# Patient Record
Sex: Female | Born: 1960 | Race: White | Hispanic: No | Marital: Single | Smoking: Smoker, current status unknown
Health system: Southern US, Community
[De-identification: ages and names within clinical notes are randomized; demographics above are authoritative.]

## PROBLEM LIST (undated history)

## (undated) DIAGNOSIS — E7800 Pure hypercholesterolemia, unspecified: Secondary | ICD-10-CM

## (undated) DIAGNOSIS — E119 Type 2 diabetes mellitus without complications: Secondary | ICD-10-CM

## (undated) DIAGNOSIS — K219 Gastro-esophageal reflux disease without esophagitis: Secondary | ICD-10-CM

## (undated) DIAGNOSIS — E559 Vitamin D deficiency, unspecified: Secondary | ICD-10-CM

## (undated) DIAGNOSIS — N289 Disorder of kidney and ureter, unspecified: Secondary | ICD-10-CM

## (undated) DIAGNOSIS — I10 Essential (primary) hypertension: Secondary | ICD-10-CM

## (undated) DIAGNOSIS — I4891 Unspecified atrial fibrillation: Secondary | ICD-10-CM

## (undated) DIAGNOSIS — M79606 Pain in leg, unspecified: Secondary | ICD-10-CM

## (undated) DIAGNOSIS — F339 Major depressive disorder, recurrent, unspecified: Secondary | ICD-10-CM

## (undated) DIAGNOSIS — K635 Polyp of colon: Secondary | ICD-10-CM

## (undated) DIAGNOSIS — I5189 Other ill-defined heart diseases: Secondary | ICD-10-CM

## (undated) DIAGNOSIS — R002 Palpitations: Secondary | ICD-10-CM

## (undated) HISTORY — DX: Essential (primary) hypertension: I10

## (undated) HISTORY — PX: AMPUTATION TOE: SHX6595

## (undated) HISTORY — DX: Major depressive disorder, recurrent, unspecified: F33.9

## (undated) HISTORY — DX: Gastro-esophageal reflux disease without esophagitis: K21.9

## (undated) HISTORY — DX: Polyp of colon: K63.5

## (undated) HISTORY — DX: Palpitations: R00.2

## (undated) HISTORY — DX: Vitamin D deficiency, unspecified: E55.9

## (undated) HISTORY — DX: Pain in leg, unspecified: M79.606

## (undated) HISTORY — DX: Disorder of kidney and ureter, unspecified: N28.9

## (undated) HISTORY — DX: Other ill-defined heart diseases: I51.89

## (undated) HISTORY — DX: Pure hypercholesterolemia, unspecified: E78.00

## (undated) HISTORY — DX: Unspecified atrial fibrillation: I48.91

## (undated) HISTORY — DX: Type 2 diabetes mellitus without complications: E11.9

---

## 1998-12-27 HISTORY — PX: TOTAL VAGINAL HYSTERECTOMY: SHX2548

## 2021-04-04 ENCOUNTER — Ambulatory Visit: Payer: Medicare Other | Admitting: Cardiovascular Disease

## 2021-04-18 ENCOUNTER — Encounter: Payer: Self-pay | Admitting: Cardiovascular Disease

## 2021-04-18 ENCOUNTER — Other Ambulatory Visit: Payer: Self-pay

## 2021-04-18 ENCOUNTER — Ambulatory Visit (INDEPENDENT_AMBULATORY_CARE_PROVIDER_SITE_OTHER): Payer: Medicare Other | Admitting: Cardiovascular Disease

## 2021-04-18 VITALS — BP 134/78 | HR 84 | Ht 69.0 in | Wt 212.8 lb

## 2021-04-18 DIAGNOSIS — Z01818 Encounter for other preprocedural examination: Secondary | ICD-10-CM | POA: Diagnosis not present

## 2021-04-18 DIAGNOSIS — I2510 Atherosclerotic heart disease of native coronary artery without angina pectoris: Secondary | ICD-10-CM

## 2021-04-18 DIAGNOSIS — Z01812 Encounter for preprocedural laboratory examination: Secondary | ICD-10-CM

## 2021-04-18 DIAGNOSIS — I480 Paroxysmal atrial fibrillation: Secondary | ICD-10-CM

## 2021-04-18 DIAGNOSIS — I739 Peripheral vascular disease, unspecified: Secondary | ICD-10-CM | POA: Diagnosis not present

## 2021-04-18 DIAGNOSIS — E785 Hyperlipidemia, unspecified: Secondary | ICD-10-CM | POA: Diagnosis not present

## 2021-04-18 MED ORDER — PREDNISONE 50 MG PO TABS: ORAL_TABLET | ORAL | 0 refills | Status: DC

## 2021-04-18 NOTE — Patient Instructions (Addendum)
    [facility] CARDIOVASCULAR DIVISION [facility] NORTHLINE [address] [place] [address] Dept: [phone] Loc: [phone]  Kerry Randall  04/18/2021  You are scheduled for a Peripheral Angiogram on Wednesday, June 1 with Dr. [name].  1. Please arrive at the [facility] (Main Entrance A) at [facility]: [address] [place], [place] [address] at 6:00 AM (This time is two hours before your procedure to ensure your preparation). Free valet parking service is available.   Special note: Every effort is made to have your procedure done on time. Please understand that emergencies sometimes delay scheduled procedures.  2. Diet: Do not eat solid foods after midnight.  The patient may have clear liquids until 5am upon the day of the procedure.  3. Labs: Today in office (BMET, CBC)  4. Medication instructions in preparation for your procedure:   Contrast Allergy: Yes,  Please take Prednisone 50mg  by mouth at: Thirteen hours prior to cath Seven hours prior to cath (TAKE LAST DOSE OF PREDNISONE 50 mg AND BENEDRYL 50 mg WITH YOU TO THE HOSPITAL)  Hold Eliquis for 2 days prior to procedure (No Eliquis Monday or Tuesday)   Take 1/2 of your normal dose of insulin the night before NO insulin morning of procedure  HOLD Farxiga AM of procedure HOLD Lisnopril day before AND morning of procedure  Do not take Diabetes Med Glucophage (Metformin) on the day of the procedure and HOLD 48 HOURS AFTER THE PROCEDURE.  On the morning of your procedure, take your Aspirin and any morning medicines NOT listed above.  You may use sips of water.  5. Plan for one night stay--bring personal belongings. 6. Bring a current list of your medications and current insurance cards. 7. You MUST have a responsible person to drive you home. 8. Someone MUST be with you the first 24 hours after you arrive home or your discharge will be delayed. 9.  Please wear clothes that are easy to get on and off and wear slip-on shoes.  Thank you for allowing Saturday to care for you!   -- [facility]    Your physician has requested that you have a lower extremity arterial duplex (1 week after procedure). This test is an ultrasound of the arteries in the legs. It looks at arterial blood flow in the legs. Allow one hour for Lower Arterial scans. There are no restrictions or special instructions   Follow up with Dr. [place] 2 weeks after procedure

## 2021-04-18 NOTE — H&P (View-Only) (Signed)
Cardiology Office Note   Date:  04/18/2021   ID:  Kerry Randall, DOB June 24, 1961, MRN [id number]  PCP:  Kerry Rud, PA-C  Cardiologist:   Kerry Bears, MD   No chief complaint on file.     History of Present Illness: Kerry Randall is a 60 y.o. female who was referred by Kerry Randall for evaluation and management of peripheral arterial disease. She has known history of coronary artery disease, essential hypertension, stage III chronic kidney disease, type 2 diabetes, stroke, tobacco use and peripheral arterial disease.  There is reported history of paroxysmal atrial fibrillation on previous monitor.  She was started on Eliquis in April for this. She is status post RCA drug-eluting stent placement in 2016.  She reports prolonged history of diabetic foot ulceration.  She is status post right metatarsal amputation about 5 years ago with recurrent nonhealing ulceration on the dorsal side of the foot.  In addition, she is status post amputation of the left small toe.  She underwent abdominal aortogram with right lower extremity angiography using CO2 by Dr. Isabell Randall in July 2021.  There was no significant aortoiliac disease.  There was multilevel nonflow limiting disease affecting the right SFA and two-vessel runoff below the knee via the anterior tibial and posterior tibial arteries.  No intervention was needed.    She is followed by Dr. Romualdo Randall in podiatry at [facility].  She has chronic nonhealing ulceration on the dorsal side of the right foot.  This is approximately 2 x 2 inches and about half centimeter deep.  She had venous Doppler study done in March which was negative for DVT.  She underwent lower extremity arterial Doppler studies in March which showed bilateral ABI of 0.65.  Duplex showed possible severe stenosis in bilateral SFA.  Velocity in the distal right SFA was 436 cm/s.   Past Medical History:  Diagnosis Date  . Atrial fibrillation (HCC)   . Colon polyps    . Diabetes mellitus without complication (HCC)   . GERD (gastroesophageal reflux disease)   . Heart disease, hyperkinetic   . Hypercholesteremia   . Hypertension   . Kidney disease   . Leg pain   . Major depressive disorder, recurrent (HCC)   . Palpitations   . Vitamin D deficiency     Past Surgical History:  Procedure Laterality Date  . AMPUTATION TOE Bilateral    small toe on left foot; all toes on right foot     Current Outpatient Medications  Medication Sig Dispense Refill  . albuterol (VENTOLIN HFA) 108 (90 Base) MCG/ACT inhaler Inhale into the lungs.    Marland Kitchen apixaban (ELIQUIS) 5 MG TABS tablet     . ascorbic acid (VITAMIN C) 500 MG tablet Take by mouth.    . Cholecalciferol 25 MCG (1000 UT) tablet Take by mouth.    . cyanocobalamin 1000 MCG tablet Take by mouth.    . dapagliflozin propanediol (FARXIGA) 10 MG TABS tablet Take by mouth.    . Dulaglutide (TRULICITY) 0.75 MG/0.5ML SOPN 1 (ONE) SOLUTION PEN INJECTOR UNDER SKIN WEEKLY    . DULoxetine (CYMBALTA) 60 MG capsule Take 1 capsule by mouth 2 (two) times daily.    . fesoterodine (TOVIAZ) 8 MG TB24 tablet Take 1 tablet by mouth daily.    Marland Kitchen gabapentin (NEURONTIN) 600 MG tablet Take 1 tablet by mouth 3 (three) times daily.    Marland Kitchen gemfibrozil (LOPID) 600 MG tablet Take 1 tablet by mouth 2 (two) times daily.    Marland Kitchen  hydrOXYzine (ATARAX/VISTARIL) 25 MG tablet Take 1 tablet by mouth 2 (two) times daily.    . insulin glargine (LANTUS) 100 UNIT/ML Solostar Pen Inject into the skin. 17 units at night    . Iron-FA-B Cmp-C-Biot-Probiotic (FUSION PLUS PO) Take by mouth.    . Iron-Vit C-Vit B12-Folic Acid (IRON 100 PLUS) 270-623-7.628-3 MG TABS Take by mouth 2 (two) times daily.    Marland Kitchen lisinopril (ZESTRIL) 30 MG tablet Take 1 tablet by mouth daily.    . metFORMIN (GLUCOPHAGE-XR) 500 MG 24 hr tablet Take 2 tablets by mouth 2 (two) times daily.    . metoprolol succinate (TOPROL-XL) 50 MG 24 hr tablet Take 1 tablet by mouth daily.    .  mirabegron ER (MYRBETRIQ) 25 MG TB24 tablet Take 1 tablet by mouth daily.    . nitroGLYCERIN (NITROSTAT) 0.4 MG SL tablet DISSOLVE 1 TABLET UNDER THE TONGUE EVERY 5 MINUTES AS  NEEDED FOR CHEST PAIN. MAX  OF 3 TABLETS IN 15 MINUTES. CALL 911 IF PAIN PERSISTS.    Marland Kitchen pantoprazole (PROTONIX) 40 MG tablet Take 1 tablet by mouth daily.    . predniSONE (DELTASONE) 50 MG tablet Take 1 tablet 13 hours prior to procedure, 1 tablet 7 hours prior to procedure, and 1 tablet (with Benedryl 50 mg) prior to going to the hospital for procedure 3 tablet 0  . REPATHA SURECLICK 140 MG/ML SOAJ Inject into the skin.    Marland Kitchen varenicline (CHANTIX) 1 MG tablet Take 1 tablet by mouth 2 (two) times daily.     No current facility-administered medications for this visit.    Allergies:   Erythromycin, Iodinated diagnostic agents, Levofloxacin, Oxycodone-acetaminophen, Penicillins, Rosuvastatin, and Sulfa antibiotics    Social History:  The patient  reports that she has been smoking. She has never used smokeless tobacco.   Family History:  The patient's family history includes Anxiety disorder in her daughter; Brain cancer in her sister; Depression in her daughter and sister; Diabetes type II in her daughter and mother; Heart disease in her mother and paternal aunt; Hyperlipidemia in her maternal aunt, maternal uncle, paternal aunt, and paternal uncle; Hypertension in her maternal aunt, maternal uncle, mother, paternal aunt, paternal uncle, and sister; Throat cancer in her father.    ROS:  Please see the history of present illness.   Otherwise, review of systems are positive for none.   All other systems are reviewed and negative.    PHYSICAL EXAM: VS:  BP 134/78 (BP Location: Right Arm, Patient Position: Sitting, Cuff Size: Normal)   Pulse 84   Ht 5\' 9"  (1.753 m)   Wt 212 lb 12.8 oz (96.5 kg)   BMI 31.43 kg/m  , BMI Body mass index is 31.43 kg/m. GEN: Well nourished, well developed, in no acute distress  HEENT: normal   Neck: no JVD, carotid bruits, or masses Cardiac: RRR; no murmurs, rubs, or gallops,no edema  Respiratory:  clear to auscultation bilaterally, normal work of breathing GI: soft, nontender, nondistended, + BS MS: no deformity or atrophy  Skin: warm and dry, no rash Neuro:  Strength and sensation are intact Psych: euthymic mood, full affect   EKG:  EKG is ordered today. The ekg ordered today demonstrates normal sinus rhythm with PVCs.   Recent Labs: No results found for requested labs within last 8760 hours.    Lipid Panel No results found for: CHOL, TRIG, HDL, CHOLHDL, VLDL, LDLCALC, LDLDIRECT    Wt Readings from Last 3 Encounters:  04/18/21 212 lb 12.8 oz (  96.5 kg)       No flowsheet data found.    ASSESSMENT AND PLAN:  1.  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  Noninvasive vascular studies suggested significant bilateral SFA disease.  This is a limb threatening situation.  I recommend proceeding with  abdominal aortogram with lower extremity angiography and possible endovascular intervention.  I discussed the procedure in details as well as risk and benefits.  She does have underlying chronic kidney disease and thus we will plan on using CO2 and hydrate before the procedure.  Hold Eliquis 2 days before the procedure.  Planned access is via the left common femoral artery.  2.  Paroxysmal atrial fibrillation: This was diagnosed based on outpatient monitor.  She is currently on Eliquis which will be held 2 days before the angiogram.  3. Hyperlipidemia: Recent lipid profile showed an LDL of 38..  There is reported history of intolerance to statins due to myalgia and currently she is on Lopid and Repatha.  4.  Essential hypertension: Blood pressure is well controlled.  5.  Coronary artery disease involving native coronary arteries without angina: Previous stent  placement to the right coronary artery.  Continue medical therapy.  6.  Tobacco use: Prolonged history of tobacco use but she reports that she quit 1 week ago.   Disposition: Schedule angiogram for next week and FU with me in 1 month  Signed,  Kerry Bears, MD  04/18/2021 1:34 PM    [facility]

## 2021-04-18 NOTE — Progress Notes (Signed)
Cardiology Office Note   Date:  04/18/2021   ID:  Kerry Randall, DOB June 24, 1961, MRN [id number]  PCP:  Coralee Rud, PA-C  Cardiologist:   [name], MD   No chief complaint on file.     History of Present Illness: Kerry Randall is a 60 y.o. female who was referred by Roxanne Mins for evaluation and management of peripheral arterial disease. She has known history of coronary artery disease, essential hypertension, stage III chronic kidney disease, type 2 diabetes, stroke, tobacco use and peripheral arterial disease.  There is reported history of paroxysmal atrial fibrillation on previous monitor.  She was started on Eliquis in April for this. She is status post RCA drug-eluting stent placement in 2016.  She reports prolonged history of diabetic foot ulceration.  She is status post right metatarsal amputation about 5 years ago with recurrent nonhealing ulceration on the dorsal side of the foot.  In addition, she is status post amputation of the left small toe.  She underwent abdominal aortogram with right lower extremity angiography using CO2 by Dr. Isabell Jarvis in July 2021.  There was no significant aortoiliac disease.  There was multilevel nonflow limiting disease affecting the right SFA and two-vessel runoff below the knee via the anterior tibial and posterior tibial arteries.  No intervention was needed.    She is followed by Dr. Romualdo Bolk in podiatry at [facility].  She has chronic nonhealing ulceration on the dorsal side of the right foot.  This is approximately 2 x 2 inches and about half centimeter deep.  She had venous Doppler study done in March which was negative for DVT.  She underwent lower extremity arterial Doppler studies in March which showed bilateral ABI of 0.65.  Duplex showed possible severe stenosis in bilateral SFA.  Velocity in the distal right SFA was 436 cm/s.   Past Medical History:  Diagnosis Date  . Atrial fibrillation (HCC)   . Colon polyps    . Diabetes mellitus without complication (HCC)   . GERD (gastroesophageal reflux disease)   . Heart disease, hyperkinetic   . Hypercholesteremia   . Hypertension   . Kidney disease   . Leg pain   . Major depressive disorder, recurrent (HCC)   . Palpitations   . Vitamin D deficiency     Past Surgical History:  Procedure Laterality Date  . AMPUTATION TOE Bilateral    small toe on left foot; all toes on right foot     Current Outpatient Medications  Medication Sig Dispense Refill  . albuterol (VENTOLIN HFA) 108 (90 Base) MCG/ACT inhaler Inhale into the lungs.    Marland Kitchen apixaban (ELIQUIS) 5 MG TABS tablet     . ascorbic acid (VITAMIN C) 500 MG tablet Take by mouth.    . Cholecalciferol 25 MCG (1000 UT) tablet Take by mouth.    . cyanocobalamin 1000 MCG tablet Take by mouth.    . dapagliflozin propanediol (FARXIGA) 10 MG TABS tablet Take by mouth.    . Dulaglutide (TRULICITY) 0.75 MG/0.5ML SOPN 1 (ONE) SOLUTION PEN INJECTOR UNDER SKIN WEEKLY    . DULoxetine (CYMBALTA) 60 MG capsule Take 1 capsule by mouth 2 (two) times daily.    . fesoterodine (TOVIAZ) 8 MG TB24 tablet Take 1 tablet by mouth daily.    Marland Kitchen gabapentin (NEURONTIN) 600 MG tablet Take 1 tablet by mouth 3 (three) times daily.    Marland Kitchen gemfibrozil (LOPID) 600 MG tablet Take 1 tablet by mouth 2 (two) times daily.    Marland Kitchen  hydrOXYzine (ATARAX/VISTARIL) 25 MG tablet Take 1 tablet by mouth 2 (two) times daily.    . insulin glargine (LANTUS) 100 UNIT/ML Solostar Pen Inject into the skin. 17 units at night    . Iron-FA-B Cmp-C-Biot-Probiotic (FUSION PLUS PO) Take by mouth.    . Iron-Vit C-Vit B12-Folic Acid (IRON 100 PLUS) 270-623-7.628-3 MG TABS Take by mouth 2 (two) times daily.    Marland Kitchen lisinopril (ZESTRIL) 30 MG tablet Take 1 tablet by mouth daily.    . metFORMIN (GLUCOPHAGE-XR) 500 MG 24 hr tablet Take 2 tablets by mouth 2 (two) times daily.    . metoprolol succinate (TOPROL-XL) 50 MG 24 hr tablet Take 1 tablet by mouth daily.    .  mirabegron ER (MYRBETRIQ) 25 MG TB24 tablet Take 1 tablet by mouth daily.    . nitroGLYCERIN (NITROSTAT) 0.4 MG SL tablet DISSOLVE 1 TABLET UNDER THE TONGUE EVERY 5 MINUTES AS  NEEDED FOR CHEST PAIN. MAX  OF 3 TABLETS IN 15 MINUTES. CALL 911 IF PAIN PERSISTS.    Marland Kitchen pantoprazole (PROTONIX) 40 MG tablet Take 1 tablet by mouth daily.    . predniSONE (DELTASONE) 50 MG tablet Take 1 tablet 13 hours prior to procedure, 1 tablet 7 hours prior to procedure, and 1 tablet (with Benedryl 50 mg) prior to going to the hospital for procedure 3 tablet 0  . REPATHA SURECLICK 140 MG/ML SOAJ Inject into the skin.    Marland Kitchen varenicline (CHANTIX) 1 MG tablet Take 1 tablet by mouth 2 (two) times daily.     No current facility-administered medications for this visit.    Allergies:   Erythromycin, Iodinated diagnostic agents, Levofloxacin, Oxycodone-acetaminophen, Penicillins, Rosuvastatin, and Sulfa antibiotics    Social History:  The patient  reports that she has been smoking. She has never used smokeless tobacco.   Family History:  The patient's family history includes Anxiety disorder in her daughter; Brain cancer in her sister; Depression in her daughter and sister; Diabetes type II in her daughter and mother; Heart disease in her mother and paternal aunt; Hyperlipidemia in her maternal aunt, maternal uncle, paternal aunt, and paternal uncle; Hypertension in her maternal aunt, maternal uncle, mother, paternal aunt, paternal uncle, and sister; Throat cancer in her father.    ROS:  Please see the history of present illness.   Otherwise, review of systems are positive for none.   All other systems are reviewed and negative.    PHYSICAL EXAM: VS:  BP 134/78 (BP Location: Right Arm, Patient Position: Sitting, Cuff Size: Normal)   Pulse 84   Ht 5\' 9"  (1.753 m)   Wt 212 lb 12.8 oz (96.5 kg)   BMI 31.43 kg/m  , BMI Body mass index is 31.43 kg/m. GEN: Well nourished, well developed, in no acute distress  HEENT: normal   Neck: no JVD, carotid bruits, or masses Cardiac: RRR; no murmurs, rubs, or gallops,no edema  Respiratory:  clear to auscultation bilaterally, normal work of breathing GI: soft, nontender, nondistended, + BS MS: no deformity or atrophy  Skin: warm and dry, no rash Neuro:  Strength and sensation are intact Psych: euthymic mood, full affect   EKG:  EKG is ordered today. The ekg ordered today demonstrates normal sinus rhythm with PVCs.   Recent Labs: No results found for requested labs within last 8760 hours.    Lipid Panel No results found for: CHOL, TRIG, HDL, CHOLHDL, VLDL, LDLCALC, LDLDIRECT    Wt Readings from Last 3 Encounters:  04/18/21 212 lb 12.8 oz (  96.5 kg)       No flowsheet data found.    ASSESSMENT AND PLAN:  1.  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  Noninvasive vascular studies suggested significant bilateral SFA disease.  This is a limb threatening situation.  I recommend proceeding with  abdominal aortogram with lower extremity angiography and possible endovascular intervention.  I discussed the procedure in details as well as risk and benefits.  She does have underlying chronic kidney disease and thus we will plan on using CO2 and hydrate before the procedure.  Hold Eliquis 2 days before the procedure.  Planned access is via the left common femoral artery.  2.  Paroxysmal atrial fibrillation: This was diagnosed based on outpatient monitor.  She is currently on Eliquis which will be held 2 days before the angiogram.  3. Hyperlipidemia: Recent lipid profile showed an LDL of 38..  There is reported history of intolerance to statins due to myalgia and currently she is on Lopid and Repatha.  4.  Essential hypertension: Blood pressure is well controlled.  5.  Coronary artery disease involving native coronary arteries without angina: Previous stent  placement to the right coronary artery.  Continue medical therapy.  6.  Tobacco use: Prolonged history of tobacco use but she reports that she quit 1 week ago.   Disposition: Schedule angiogram for next week and FU with me in 1 month  Signed,  [name], MD  04/18/2021 1:34 PM    [facility]

## 2021-04-19 LAB — CBC
Hematocrit: 38 % (ref 34.0–46.6)
Hemoglobin: 12.1 g/dL (ref 11.1–15.9)
MCH: 28.6 pg (ref 26.6–33.0)
MCHC: 31.8 g/dL (ref 31.5–35.7)
MCV: 90 fL (ref 79–97)
Platelets: 235 10*3/uL (ref 150–450)
RBC: 4.23 x10E6/uL (ref 3.77–5.28)
RDW: 14.3 % (ref 11.7–15.4)
WBC: 8.5 10*3/uL (ref 3.4–10.8)

## 2021-04-19 LAB — BASIC METABOLIC PANEL
BUN/Creatinine Ratio: 16 (ref 12–28)
BUN: 19 mg/dL (ref 8–27)
CO2: 16 mmol/L — ABNORMAL LOW (ref 20–29)
Calcium: 9.2 mg/dL (ref 8.7–10.3)
Chloride: 106 mmol/L (ref 96–106)
Creatinine, Ser: 1.21 mg/dL — ABNORMAL HIGH (ref 0.57–1.00)
Glucose: 100 mg/dL — ABNORMAL HIGH (ref 65–99)
Potassium: 5.6 mmol/L — ABNORMAL HIGH (ref 3.5–5.2)
Sodium: 140 mmol/L (ref 134–144)
eGFR: 51 mL/min/{1.73_m2} — ABNORMAL LOW (ref >59)

## 2021-04-20 ENCOUNTER — Other Ambulatory Visit (HOSPITAL_COMMUNITY): Payer: Self-pay | Admitting: Internal Medicine

## 2021-04-20 DIAGNOSIS — K3184 Gastroparesis: Secondary | ICD-10-CM

## 2021-04-25 NOTE — Pre-Procedure Instructions (Signed)
Attempted to call patient regarding procedure instructions.    Left voice mail to arrive @ 6:00am in the morning.  Arrival time and place: [facility] Main Entrance [facility]) at: 6:00   No solid food after midnight prior to cath, clear liquids until 5 AM day of procedure. CONTRAST ALLERGY: Yes  Take Prednisone & Benadryl as instructed.   Eliquis last dose should have been Sunday 04/23/21.  Hold Farxiga and Lisinopril day of procedure.  Also hold Metformin day of procedure.    AM meds can be  taken pre-cath with sips of water including: ASA 81 mg   Confirmed patient has responsible adult to drive home post procedure and be with patient first 24 hours after arriving home:  You are allowed ONE visitor in the waiting room during the time you are at the hospital for your procedure. Both you and your visitor must wear a mask once you enter the hospital.   Patient reports does not currently have any symptoms concerning for COVID-19 and no household members with COVID-19 like illness.   If anyone is having covid symptoms please call the office to reschedule.

## 2021-04-26 ENCOUNTER — Other Ambulatory Visit: Payer: Self-pay

## 2021-04-26 ENCOUNTER — Encounter (HOSPITAL_COMMUNITY): Payer: Self-pay | Admitting: Cardiovascular Disease

## 2021-04-26 ENCOUNTER — Ambulatory Visit (HOSPITAL_COMMUNITY)
Admission: RE | Admit: 2021-04-26 | Discharge: 2021-04-27 | Disposition: A | Discharge status: Home / Self Care | Payer: Medicare Other | Attending: Cardiovascular Disease | Admitting: Cardiovascular Disease

## 2021-04-26 ENCOUNTER — Encounter (HOSPITAL_COMMUNITY)
Admission: RE | Disposition: A | Discharge status: Home / Self Care | Payer: Self-pay | Source: Home / Self Care | Attending: Cardiovascular Disease

## 2021-04-26 DIAGNOSIS — Z880 Allergy status to penicillin: Secondary | ICD-10-CM | POA: Insufficient documentation

## 2021-04-26 DIAGNOSIS — I480 Paroxysmal atrial fibrillation: Secondary | ICD-10-CM | POA: Diagnosis not present

## 2021-04-26 DIAGNOSIS — L97519 Non-pressure chronic ulcer of other part of right foot with unspecified severity: Secondary | ICD-10-CM | POA: Insufficient documentation

## 2021-04-26 DIAGNOSIS — E785 Hyperlipidemia, unspecified: Secondary | ICD-10-CM | POA: Insufficient documentation

## 2021-04-26 DIAGNOSIS — Z7901 Long term (current) use of anticoagulants: Secondary | ICD-10-CM | POA: Diagnosis not present

## 2021-04-26 DIAGNOSIS — Z882 Allergy status to sulfonamides status: Secondary | ICD-10-CM | POA: Insufficient documentation

## 2021-04-26 DIAGNOSIS — F17210 Nicotine dependence, cigarettes, uncomplicated: Secondary | ICD-10-CM | POA: Insufficient documentation

## 2021-04-26 DIAGNOSIS — I70235 Atherosclerosis of native arteries of right leg with ulceration of other part of foot: Secondary | ICD-10-CM | POA: Diagnosis not present

## 2021-04-26 DIAGNOSIS — E11621 Type 2 diabetes mellitus with foot ulcer: Secondary | ICD-10-CM | POA: Diagnosis not present

## 2021-04-26 DIAGNOSIS — E1122 Type 2 diabetes mellitus with diabetic chronic kidney disease: Secondary | ICD-10-CM | POA: Insufficient documentation

## 2021-04-26 DIAGNOSIS — E1151 Type 2 diabetes mellitus with diabetic peripheral angiopathy without gangrene: Secondary | ICD-10-CM | POA: Insufficient documentation

## 2021-04-26 DIAGNOSIS — Z794 Long term (current) use of insulin: Secondary | ICD-10-CM

## 2021-04-26 DIAGNOSIS — Z881 Allergy status to other antibiotic agents status: Secondary | ICD-10-CM | POA: Diagnosis not present

## 2021-04-26 DIAGNOSIS — Z7984 Long term (current) use of oral hypoglycemic drugs: Secondary | ICD-10-CM | POA: Diagnosis not present

## 2021-04-26 DIAGNOSIS — I70211 Atherosclerosis of native arteries of extremities with intermittent claudication, right leg: Secondary | ICD-10-CM | POA: Diagnosis not present

## 2021-04-26 DIAGNOSIS — Z95828 Presence of other vascular implants and grafts: Secondary | ICD-10-CM

## 2021-04-26 DIAGNOSIS — I2510 Atherosclerotic heart disease of native coronary artery without angina pectoris: Secondary | ICD-10-CM | POA: Insufficient documentation

## 2021-04-26 DIAGNOSIS — Z79899 Other long term (current) drug therapy: Secondary | ICD-10-CM | POA: Diagnosis not present

## 2021-04-26 DIAGNOSIS — N183 Chronic kidney disease, stage 3 unspecified: Secondary | ICD-10-CM | POA: Diagnosis not present

## 2021-04-26 DIAGNOSIS — I70229 Atherosclerosis of native arteries of extremities with rest pain, unspecified extremity: Secondary | ICD-10-CM | POA: Diagnosis present

## 2021-04-26 DIAGNOSIS — Z955 Presence of coronary angioplasty implant and graft: Secondary | ICD-10-CM | POA: Diagnosis not present

## 2021-04-26 DIAGNOSIS — I739 Peripheral vascular disease, unspecified: Secondary | ICD-10-CM

## 2021-04-26 DIAGNOSIS — I129 Hypertensive chronic kidney disease with stage 1 through stage 4 chronic kidney disease, or unspecified chronic kidney disease: Secondary | ICD-10-CM | POA: Insufficient documentation

## 2021-04-26 DIAGNOSIS — E118 Type 2 diabetes mellitus with unspecified complications: Secondary | ICD-10-CM

## 2021-04-26 DIAGNOSIS — K3184 Gastroparesis: Secondary | ICD-10-CM

## 2021-04-26 HISTORY — PX: PERIPHERAL VASCULAR INTERVENTION: CATH118257

## 2021-04-26 HISTORY — PX: PERIPHERAL VASCULAR BALLOON ANGIOPLASTY: CATH118281

## 2021-04-26 HISTORY — PX: ABDOMINAL AORTOGRAM W/LOWER EXTREMITY: CATH118223

## 2021-04-26 LAB — BASIC METABOLIC PANEL
Anion gap: 9 (ref 5–15)
BUN: 28 mg/dL — ABNORMAL HIGH (ref 8–23)
CO2: 21 mmol/L — ABNORMAL LOW (ref 22–32)
Calcium: 9.2 mg/dL (ref 8.9–10.3)
Chloride: 107 mmol/L (ref 98–111)
Creatinine, Ser: 1.31 mg/dL — ABNORMAL HIGH (ref 0.44–1.00)
GFR, Estimated: 46 mL/min — ABNORMAL LOW (ref >60)
Glucose, Bld: 264 mg/dL — ABNORMAL HIGH (ref 70–99)
Potassium: 5.4 mmol/L — ABNORMAL HIGH (ref 3.5–5.1)
Sodium: 137 mmol/L (ref 135–145)

## 2021-04-26 LAB — POCT ACTIVATED CLOTTING TIME
Activated Clotting Time: 261 seconds
Activated Clotting Time: 225 seconds
Activated Clotting Time: 225 seconds

## 2021-04-26 LAB — GLUCOSE, CAPILLARY
Glucose-Capillary: 237 mg/dL — ABNORMAL HIGH (ref 70–99)
Glucose-Capillary: 197 mg/dL — ABNORMAL HIGH (ref 70–99)

## 2021-04-26 SURGERY — ABDOMINAL AORTOGRAM W/LOWER EXTREMITY
Anesthesia: LOCAL | Laterality: Right

## 2021-04-26 MED ORDER — LABETALOL HCL 5 MG/ML IV SOLN: 10.0000 mg | INTRAVENOUS | Status: DC | PRN

## 2021-04-26 MED ORDER — ASPIRIN 81 MG PO CHEW: 81.0000 mg | CHEWABLE_TABLET | ORAL | Status: DC

## 2021-04-26 MED ORDER — LIDOCAINE HCL (PF) 1 % IJ SOLN
INTRAMUSCULAR | Status: DC | PRN
  Administered 2021-04-26: 20 mL via INTRADERMAL

## 2021-04-26 MED ORDER — GABAPENTIN 600 MG PO TABS
1200.0000 mg | ORAL_TABLET | Freq: Two times a day (BID) | ORAL | Status: DC
  Administered 2021-04-26 – 2021-04-27 (×2): 1200 mg via ORAL
  Filled 2021-04-26 (×2): qty 2

## 2021-04-26 MED ORDER — MIDAZOLAM HCL 2 MG/2ML IJ SOLN
INTRAMUSCULAR | Status: AC
  Filled 2021-04-26: qty 2

## 2021-04-26 MED ORDER — SODIUM CHLORIDE 0.9% FLUSH: 3.0000 mL | INTRAVENOUS | Status: DC | PRN

## 2021-04-26 MED ORDER — PANTOPRAZOLE SODIUM 40 MG PO TBEC
40.0000 mg | DELAYED_RELEASE_TABLET | Freq: Every morning | ORAL | Status: DC
  Administered 2021-04-27: 40 mg via ORAL
  Filled 2021-04-26: qty 1

## 2021-04-26 MED ORDER — ASCORBIC ACID 500 MG PO TABS
500.0000 mg | ORAL_TABLET | Freq: Every morning | ORAL | Status: DC
  Administered 2021-04-27: 500 mg via ORAL
  Filled 2021-04-26: qty 1

## 2021-04-26 MED ORDER — B COMPLEX-C PO TABS: 1.0000 | ORAL_TABLET | ORAL | Status: DC

## 2021-04-26 MED ORDER — BIOTIN 5000 MCG PO TABS: 5000.0000 ug | ORAL_TABLET | Freq: Every morning | ORAL | Status: DC

## 2021-04-26 MED ORDER — METOPROLOL SUCCINATE ER 25 MG PO TB24
50.0000 mg | ORAL_TABLET | Freq: Every morning | ORAL | Status: DC
  Administered 2021-04-27: 50 mg via ORAL
  Filled 2021-04-26: qty 2

## 2021-04-26 MED ORDER — EVOLOCUMAB 140 MG/ML ~~LOC~~ SOAJ: 140.0000 mg | SUBCUTANEOUS | Status: DC

## 2021-04-26 MED ORDER — SODIUM CHLORIDE 0.9 % IV SOLN: INTRAVENOUS | Status: AC

## 2021-04-26 MED ORDER — HEPARIN (PORCINE) IN NACL 1000-0.9 UT/500ML-% IV SOLN
INTRAVENOUS | Status: AC
  Filled 2021-04-26: qty 1000

## 2021-04-26 MED ORDER — SODIUM CHLORIDE 0.9 % IV SOLN: 250.0000 mL | INTRAVENOUS | Status: DC | PRN

## 2021-04-26 MED ORDER — PANCRELIPASE (LIP-PROT-AMYL) 36000-114000 UNITS PO CPEP
72000.0000 [IU] | ORAL_CAPSULE | Freq: Three times a day (TID) | ORAL | Status: DC
  Administered 2021-04-26 – 2021-04-27 (×2): 72000 [IU] via ORAL
  Filled 2021-04-26 (×3): qty 2

## 2021-04-26 MED ORDER — CLOPIDOGREL BISULFATE 300 MG PO TABS
ORAL_TABLET | ORAL | Status: DC | PRN
  Administered 2021-04-26: 300 mg via ORAL

## 2021-04-26 MED ORDER — ALBUTEROL SULFATE (2.5 MG/3ML) 0.083% IN NEBU: 3.0000 mL | INHALATION_SOLUTION | Freq: Four times a day (QID) | RESPIRATORY_TRACT | Status: DC | PRN

## 2021-04-26 MED ORDER — VITAMIN E 450 MG (1000 UT) PO CAPS: 1000.0000 [IU] | ORAL_CAPSULE | Freq: Every morning | ORAL | Status: DC

## 2021-04-26 MED ORDER — NITROGLYCERIN 0.4 MG SL SUBL: 0.4000 mg | SUBLINGUAL_TABLET | SUBLINGUAL | Status: DC | PRN

## 2021-04-26 MED ORDER — HEPARIN (PORCINE) IN NACL 1000-0.9 UT/500ML-% IV SOLN
INTRAVENOUS | Status: DC | PRN
  Administered 2021-04-26 (×2): 500 mL

## 2021-04-26 MED ORDER — DAPAGLIFLOZIN PROPANEDIOL 10 MG PO TABS
10.0000 mg | ORAL_TABLET | Freq: Every morning | ORAL | Status: DC
  Administered 2021-04-27: 10 mg via ORAL
  Filled 2021-04-26: qty 1

## 2021-04-26 MED ORDER — FENTANYL CITRATE (PF) 100 MCG/2ML IJ SOLN
INTRAMUSCULAR | Status: DC | PRN
  Administered 2021-04-26 (×3): 50 ug via INTRAVENOUS

## 2021-04-26 MED ORDER — ONDANSETRON HCL 4 MG/2ML IJ SOLN: 4.0000 mg | Freq: Four times a day (QID) | INTRAMUSCULAR | Status: DC | PRN

## 2021-04-26 MED ORDER — DULOXETINE HCL 60 MG PO CPEP
60.0000 mg | ORAL_CAPSULE | Freq: Two times a day (BID) | ORAL | Status: DC
  Administered 2021-04-26 – 2021-04-27 (×2): 60 mg via ORAL
  Filled 2021-04-26 (×2): qty 1

## 2021-04-26 MED ORDER — DIPHENHYDRAMINE HCL 50 MG/ML IJ SOLN
25.0000 mg | Freq: Once | INTRAMUSCULAR | Status: AC
  Administered 2021-04-26: 25 mg via INTRAVENOUS
  Filled 2021-04-26: qty 1

## 2021-04-26 MED ORDER — MIDAZOLAM HCL 2 MG/2ML IJ SOLN
INTRAMUSCULAR | Status: DC | PRN
  Administered 2021-04-26 (×3): 1 mg via INTRAVENOUS

## 2021-04-26 MED ORDER — FESOTERODINE FUMARATE ER 8 MG PO TB24
8.0000 mg | ORAL_TABLET | Freq: Every morning | ORAL | Status: DC
  Administered 2021-04-27: 8 mg via ORAL
  Filled 2021-04-26: qty 1

## 2021-04-26 MED ORDER — LIDOCAINE HCL (PF) 1 % IJ SOLN
INTRAMUSCULAR | Status: AC
  Filled 2021-04-26: qty 30

## 2021-04-26 MED ORDER — CLOPIDOGREL BISULFATE 300 MG PO TABS
ORAL_TABLET | ORAL | Status: AC
  Filled 2021-04-26: qty 1

## 2021-04-26 MED ORDER — BRIMONIDINE TARTRATE 0.025 % OP SOLN: 1.0000 [drp] | Freq: Every day | OPHTHALMIC | Status: DC | PRN

## 2021-04-26 MED ORDER — MAGNESIUM OXIDE -MG SUPPLEMENT 400 (240 MG) MG PO TABS
400.0000 mg | ORAL_TABLET | Freq: Every morning | ORAL | Status: DC
  Administered 2021-04-27: 400 mg via ORAL
  Filled 2021-04-26: qty 1

## 2021-04-26 MED ORDER — LISINOPRIL 10 MG PO TABS
30.0000 mg | ORAL_TABLET | Freq: Every morning | ORAL | Status: DC
  Administered 2021-04-27: 30 mg via ORAL
  Filled 2021-04-26: qty 3

## 2021-04-26 MED ORDER — IODIXANOL 320 MG/ML IV SOLN
INTRAVENOUS | Status: DC | PRN
  Administered 2021-04-26: 125 mL via INTRAVENOUS

## 2021-04-26 MED ORDER — SODIUM CHLORIDE 0.9 % IV SOLN: INTRAVENOUS | Status: DC

## 2021-04-26 MED ORDER — GEMFIBROZIL 600 MG PO TABS
600.0000 mg | ORAL_TABLET | Freq: Two times a day (BID) | ORAL | Status: DC
  Administered 2021-04-26: 600 mg via ORAL
  Filled 2021-04-26 (×2): qty 1

## 2021-04-26 MED ORDER — SODIUM CHLORIDE 0.9% FLUSH: 3.0000 mL | Freq: Two times a day (BID) | INTRAVENOUS | Status: DC

## 2021-04-26 MED ORDER — MIRABEGRON ER 25 MG PO TB24
25.0000 mg | ORAL_TABLET | Freq: Every morning | ORAL | Status: DC
  Administered 2021-04-27: 25 mg via ORAL
  Filled 2021-04-26: qty 1

## 2021-04-26 MED ORDER — DULAGLUTIDE 3 MG/0.5ML ~~LOC~~ SOAJ: 3.0000 mg | SUBCUTANEOUS | Status: DC

## 2021-04-26 MED ORDER — FENTANYL CITRATE (PF) 100 MCG/2ML IJ SOLN
INTRAMUSCULAR | Status: AC
  Filled 2021-04-26: qty 2

## 2021-04-26 MED ORDER — HEPARIN SODIUM (PORCINE) 1000 UNIT/ML IJ SOLN
INTRAMUSCULAR | Status: AC
  Filled 2021-04-26: qty 1

## 2021-04-26 MED ORDER — HEPARIN SODIUM (PORCINE) 1000 UNIT/ML IJ SOLN
INTRAMUSCULAR | Status: DC | PRN
  Administered 2021-04-26: 8000 [IU] via INTRAVENOUS

## 2021-04-26 MED ORDER — INSULIN GLARGINE 100 UNIT/ML ~~LOC~~ SOLN
17.0000 [IU] | Freq: Every day | SUBCUTANEOUS | Status: DC
  Administered 2021-04-26: 17 [IU] via SUBCUTANEOUS
  Filled 2021-04-26 (×2): qty 0.17

## 2021-04-26 MED ORDER — HEPARIN (PORCINE) IN NACL 2000-0.9 UNIT/L-% IV SOLN
INTRAVENOUS | Status: DC | PRN
  Administered 2021-04-26: 2000 mL

## 2021-04-26 MED ORDER — HYDROXYZINE HCL 50 MG PO TABS
75.0000 mg | ORAL_TABLET | Freq: Every morning | ORAL | Status: DC
  Administered 2021-04-27: 75 mg via ORAL
  Filled 2021-04-26: qty 1

## 2021-04-26 MED ORDER — CLOPIDOGREL BISULFATE 75 MG PO TABS
75.0000 mg | ORAL_TABLET | Freq: Every day | ORAL | Status: DC
  Administered 2021-04-27: 75 mg via ORAL
  Filled 2021-04-26: qty 1

## 2021-04-26 MED ORDER — FERROUS SULFATE 325 (65 FE) MG PO TABS
975.0000 mg | ORAL_TABLET | Freq: Every morning | ORAL | Status: DC
  Administered 2021-04-27: 975 mg via ORAL
  Filled 2021-04-26: qty 3

## 2021-04-26 SURGICAL SUPPLY — 33 items
BALLN CHOCOLATE 5.0X40X120 (BALLOONS) ×3
BALLN MUSTANG 8X20X75 (BALLOONS) ×3
BALLN STERLING OTW 4X20X135 (BALLOONS) ×3
BALLN STERLING OTW 7X40X135 (BALLOONS) ×3
BALLOON CHOCOLATE 5.0X40X120 (BALLOONS) ×2 IMPLANT
BALLOON MUSTANG 8X20X75 (BALLOONS) ×2 IMPLANT
BALLOON STERLING OTW 4X20X135 (BALLOONS) ×2 IMPLANT
BALLOON STERLING OTW 7X40X135 (BALLOONS) ×2 IMPLANT
CATH ANGIO 5F PIGTAIL 65CM (CATHETERS) ×3 IMPLANT
CATH CROSS OVER TEMPO 5F (CATHETERS) ×3 IMPLANT
CATH QUICKCROSS SUPP .018X90CM (MICROCATHETER) ×3 IMPLANT
CATH STRAIGHT 5FR 65CM (CATHETERS) ×3 IMPLANT
DCB RANGER 5.0X100 135 (BALLOONS) ×2 IMPLANT
DEVICE CLOSURE MYNXGRIP 6/7F (Vascular Products) ×6 IMPLANT
KIT ANGIASSIST CO2 SYSTEM (KITS) ×3 IMPLANT
KIT ENCORE 26 ADVANTAGE (KITS) ×3 IMPLANT
KIT MICROPUNCTURE NIT STIFF (SHEATH) ×3 IMPLANT
KIT PV (KITS) ×3 IMPLANT
RANGER DCB 5.0X100 135 (BALLOONS) ×3
SHEATH BRITE TIP 7FR 35CM (SHEATH) ×3 IMPLANT
SHEATH PINNACLE 5F 10CM (SHEATH) ×3 IMPLANT
SHEATH PINNACLE 6F 10CM (SHEATH) ×3 IMPLANT
SHEATH PINNACLE 7F 10CM (SHEATH) ×3 IMPLANT
SHEATH PINNACLE ST 6F 45CM (SHEATH) ×3 IMPLANT
STENT EXPRESS LD 7X27X75 (Permanent Stent) ×3 IMPLANT
SYR MEDRAD MARK 7 150ML (SYRINGE) ×3 IMPLANT
TAPE VIPERTRACK RADIOPAQ (MISCELLANEOUS) ×2 IMPLANT
TAPE VIPERTRACK RADIOPAQUE (MISCELLANEOUS) ×1
TRANSDUCER W/STOPCOCK (MISCELLANEOUS) ×3 IMPLANT
TRAY PV CATH (CUSTOM PROCEDURE TRAY) ×3 IMPLANT
WIRE G V18X300CM (WIRE) ×3 IMPLANT
WIRE HITORQ VERSACORE ST 145CM (WIRE) ×6 IMPLANT
WIRE RUNTHROUGH .014X300CM (WIRE) ×3 IMPLANT

## 2021-04-26 NOTE — Progress Notes (Signed)
Inpatient Diabetes Program Recommendations  AACE/ADA: New Consensus Statement on Inpatient Glycemic Control (2015)  Target Ranges:  Prepandial:   less than 140 mg/dL      Peak postprandial:   less than 180 mg/dL (1-2 hours)      Critically ill patients:  140 - 180 mg/dL   Lab Results  Component Value Date   GLUCAP 237 (H) 04/26/2021    Review of Glycemic Control Results for Kerry Randall, Kerry Randall (MRN [id number]) as of 04/26/2021 14:44  Ref. Range 04/26/2021 06:11  Glucose-Capillary Latest Ref Range: 70 - 99 mg/dL 297 (H)   Diabetes history:  DM2 Outpatient Diabetes medications:  Farxiga 10 mg daily Trulicity 3 mg Q Tuesday Lantus 17 units QD Metformin 1000 mg BID Current orders for Inpatient glycemic control:  Farxiga 10 mg QD Lantus 17 units daily  Inpatient Diabetes Program Recommendations:    If patient remains inpatient please add glycemic control order set with Novolog 0-9 units TID and 0-5 QHS.  Will continue to follow while inpatient.  Thank you, Dulce Sellar, RN, BSN Diabetes Coordinator Inpatient Diabetes Program [phone] (team pager from 8a-5p)

## 2021-04-26 NOTE — Interval H&P Note (Signed)
History and Physical Interval Note:  04/26/2021 10:25 AM  Kerry Randall  has presented today for surgery, with the diagnosis of pad.  The various methods of treatment have been discussed with the patient and family. After consideration of risks, benefits and other options for treatment, the patient has consented to  Procedure(s): ABDOMINAL AORTOGRAM W/LOWER EXTREMITY (N/A) as a surgical intervention.  The patient's history has been reviewed, patient examined, no change in status, stable for surgery.  I have reviewed the patient's chart and labs.  Questions were answered to the patient's satisfaction.     [name]

## 2021-04-27 DIAGNOSIS — I70229 Atherosclerosis of native arteries of extremities with rest pain, unspecified extremity: Secondary | ICD-10-CM

## 2021-04-27 DIAGNOSIS — E1151 Type 2 diabetes mellitus with diabetic peripheral angiopathy without gangrene: Secondary | ICD-10-CM | POA: Diagnosis not present

## 2021-04-27 LAB — BASIC METABOLIC PANEL
Anion gap: 9 (ref 5–15)
BUN: 26 mg/dL — ABNORMAL HIGH (ref 8–23)
CO2: 23 mmol/L (ref 22–32)
Calcium: 8.9 mg/dL (ref 8.9–10.3)
Chloride: 106 mmol/L (ref 98–111)
Creatinine, Ser: 1.16 mg/dL — ABNORMAL HIGH (ref 0.44–1.00)
GFR, Estimated: 53 mL/min — ABNORMAL LOW (ref >60)
Glucose, Bld: 178 mg/dL — ABNORMAL HIGH (ref 70–99)
Potassium: 4.5 mmol/L (ref 3.5–5.1)
Sodium: 138 mmol/L (ref 135–145)

## 2021-04-27 MED ORDER — NICOTINE 14 MG/24HR TD PT24: 14.0000 mg | MEDICATED_PATCH | Freq: Every day | TRANSDERMAL | Status: DC

## 2021-04-27 MED ORDER — NICOTINE 14 MG/24HR TD PT24
14.0000 mg | MEDICATED_PATCH | Freq: Every day | TRANSDERMAL | Status: DC
  Administered 2021-04-27: 14 mg via TRANSDERMAL
  Filled 2021-04-27: qty 1

## 2021-04-27 MED ORDER — NICOTINE 14 MG/24HR TD PT24: 14.0000 mg | MEDICATED_PATCH | Freq: Every day | TRANSDERMAL | 0 refills | Status: AC

## 2021-04-27 MED ORDER — CLOPIDOGREL BISULFATE 75 MG PO TABS: 75.0000 mg | ORAL_TABLET | Freq: Every day | ORAL | 0 refills | Status: DC

## 2021-04-27 NOTE — Discharge Instructions (Signed)
PLEASE REMEMBER TO BRING ALL OF YOUR MEDICATIONS TO EACH OF YOUR FOLLOW-UP OFFICE VISITS.  PLEASE ATTEND ALL SCHEDULED FOLLOW-UP APPOINTMENTS.   Activity: Increase activity slowly as tolerated. You may shower, but no soaking baths (or swimming) for 1 week. No driving for 2 days. No lifting over 5 lbs for 1 week. No sexual activity for 1 week.   You May Return to Work: in 1 week (if applicable)  Wound Care: You may wash cath site gently with soap and water. Keep cath site clean and dry. If you notice pain, swelling, bleeding or pus at your cath site, please call [phone].    PV Cath Site Care Refer to this sheet in the next few weeks. These instructions provide you with information on caring for yourself after your procedure. Your caregiver may also give you more specific instructions. Your treatment has been planned according to current medical practices, but problems sometimes occur. Call your caregiver if you have any problems or questions after your procedure. HOME CARE INSTRUCTIONS  You may shower 24 hours after the procedure. Remove the bandage (dressing) and gently wash the site with plain soap and water. Gently pat the site dry.   Do not apply powder or lotion to the site.   Do not sit in a bathtub, swimming pool, or whirlpool for 5 to 7 days.   No bending, squatting, or lifting anything over 10 pounds (4.5 kg) as directed by your caregiver.   Inspect the site at least twice daily.   Do not drive home if you are discharged the same day of the procedure. Have someone else drive you.   You may drive 24 hours after the procedure unless otherwise instructed by your caregiver.  What to expect:  Any bruising will usually fade within 1 to 2 weeks.   Blood that collects in the tissue (hematoma) may be painful to the touch. It should usually decrease in size and tenderness within 1 to 2 weeks.  SEEK IMMEDIATE MEDICAL CARE IF:  You have unusual pain at the site or down the  affected limb.   You have redness, warmth, swelling, or pain at the site.   You have drainage (other than a small amount of blood on the dressing).   You have chills.   You have a fever or persistent symptoms for more than 72 hours.   You have a fever and your symptoms suddenly get worse.   Your leg becomes pale, cool, tingly, or numb.   You have heavy bleeding from the site. Hold pressure on the site.  Document Released: 12/15/2010 Document Revised: 11/01/2011 Document Reviewed: 12/15/2010 [facility] Patient Information 2012 [place], [place].

## 2021-04-27 NOTE — Discharge Summary (Addendum)
Discharge Summary    Patient ID: Kerry Randall MRN: [id number]; DOB: Nov 02, 1961  Admit date: 04/26/2021 Discharge date: 04/27/2021  PCP:  Coralee Rud, PA-C   [facility] Providers Cardiologist:  Mal Amabile    [facility]iology: Dr Kirke Corin  Discharge Diagnoses    Active Problems:   Critical lower limb ischemia [facility])    Diagnostic Studies/Procedures    PV CATH: 04/26/2021 Findings: Abdominal aorta: Normal in size with no evidence of aneurysm or significant stenosis. Left renal artery: 50% proximal stenosis Right renal artery: 40% proximal stenosis Celiac artery: Patent Superior mesenteric artery: Not well visualized Right common iliac artery: 70% hazy stenosis at the ostium with significant systolic gradient Right internal iliac artery: Patent Right external iliac artery: Minor irregularities Right common femoral artery: Normal Right profunda femoral artery: Normal Right superficial femoral artery: Mild proximal disease.  There is 90% focal stenosis in the distal segment with collaterals Right popliteal artery: 50% stenosis Three-vessel runoff below the knee. Left common iliac artery: Minor irregularities Left internal iliac artery: Patent Left external iliac artery: Mild nonobstructive disease Left common femoral artery: Moderate calcified disease. Impression: 1.  Right lower extremity: Significant stenosis affecting the ostial common iliac artery with 40 mm systolic gradient, severe distal SFA stenosis and three-vessel runoff below the knee. 2.  Moderate left common femoral artery disease.  Full runoff on the left was not performed. 3.  Successful drug-coated balloon angioplasty to the distal right SFA and balloon expandable stent placement to the right common iliac artery.  This initially was attempted to be placed via the left common femoral artery access but the stent embolized after balloon and it has to be retrieved via the right side and deployed from the  right side as outlined in the procedure note.  Recommendations: Hydrate overnight due to chronic kidney disease. Resume Eliquis tomorrow if no bleeding complications from bilateral groins. Use Plavix for 1 month without aspirin to minimize the risk of bleeding.  _____________   History of Present Illness     Kerry Randall is a 60 y.o. 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   She was referred to Dr Kirke Corin after ABIs of 0.65 bilaterally and duplex showed severe bilat SFA stenosis.  She was seen in the office and scheduled for PV cath.  Hospital Course     Consultants: None   PV cath results are above. She had drug-coated balloon angioplasty to the distal right SFA and balloon expandable stent placement to the right common iliac artery. She tolerated the procedure well. She was hydrated overnight.   On 06/02, she was seen by Dr Clifton James and all data reviewed.  Bilateral groin sites were stable. No CP or SOB overnight. Cr improved w/ hydration.   No further workup is indicated and she is considered stable for discharge, to follow up with Cardiology and PV.   Did the patient have an acute coronary syndrome (MI, NSTEMI, STEMI, etc) this admission?:  No                               Did the patient have a percutaneous coronary intervention (stent / angioplasty)?:  No.      _____________  Discharge Vitals Blood pressure (!) 170/83, pulse 93, temperature 98.7 F (37.1 C), temperature source Oral, resp. rate 16, height 5\' 9"  (1.753 m), weight 96.2 kg, SpO2 98 %.  Filed Weights  04/26/21 0608  Weight: 96.2 kg  General: Well developed, well nourished, female in no acute distress Head: Eyes PERRLA, Head normocephalic and atraumatic Lungs: clear bilaterally to auscultation. Heart: HRRR S1 S2, without rub or gallop. No murmur. 3/4 extremity pulses are 2+ & equal. No JVD. RLE pulse weak but palpable Abdomen: Bowel sounds are present, abdomen  soft and non-tender without masses or  hernias noted. Msk: Normal strength and tone for age. Extremities: No clubbing, cyanosis or edema. Bilateral groin sites w/out ecchymosis or hematoma Skin: no rashes, multiple small lesions in various stages of healing noted. Pt is requested to f/u with PCP, may need Derm consult. Neuro: Alert and oriented X 3. Psych:  Good affect, responds appropriately   Labs & Radiologic Studies    CBC No results for input(s): WBC, NEUTROABS, HGB, HCT, MCV, PLT in the last 72 hours. Basic Metabolic Panel Recent Labs    58/52/77 0617 04/27/21 0143  NA 137 138  K 5.4* 4.5  CL 107 106  CO2 21* 23  GLUCOSE 264* 178*  BUN 28* 26*  CREATININE 1.31* 1.16*  CALCIUM 9.2 8.9   Liver Function Tests No results for input(s): AST, ALT, ALKPHOS, BILITOT, PROT, ALBUMIN in the last 72 hours. No results for input(s): LIPASE, AMYLASE in the last 72 hours. High Sensitivity Troponin:   No results for input(s): TROPONINIHS in the last 720 hours.  BNP Invalid input(s): POCBNP D-Dimer No results for input(s): DDIMER in the last 72 hours. Hemoglobin A1C No results for input(s): HGBA1C in the last 72 hours. Fasting Lipid Panel No results for input(s): CHOL, HDL, LDLCALC, TRIG, CHOLHDL, LDLDIRECT in the last 72 hours. Thyroid Function Tests No results for input(s): TSH, T4TOTAL, T3FREE, THYROIDAB in the last 72 hours.  Invalid input(s): FREET3 _____________  PERIPHERAL VASCULAR CATHETERIZATION  Result Date: 04/26/2021 1.  Right lower extremity: Significant stenosis affecting the ostial common iliac artery with 40 mm systolic gradient, severe distal SFA stenosis and three-vessel runoff below the knee. 2.  Moderate left common femoral artery disease.  Full runoff on the left was not performed. 3.  Successful drug-coated balloon angioplasty to the distal right SFA and balloon expandable stent placement to the right common iliac artery.  This initially was attempted to be  placed via the left common femoral artery access but the stent embolized after balloon and it has to be retrieved via the right side and deployed from the right side as outlined in the procedure note. Recommendations: Hydrate overnight due to chronic kidney disease. Resume Eliquis tomorrow if no bleeding complications from bilateral groins. Use Plavix for 1 month without aspirin to minimize the risk of bleeding.  Disposition   Pt is being discharged home today in good condition.  Follow-up Plans & Appointments     Follow-up Information    Coralee Rud, PA-C Follow up.   Specialty: Cardiology Why: As scheduled Contact information: [facility]  [address] [place] [address] [phone]        Iran Ouch, MD Follow up.   Specialty: Cardiology Why: Keep appts on June 8 and June 14 Contact information: [address] [place] [address] [place] [address] [phone]              Discharge Instructions    Diet - low sodium heart healthy   Complete by: As directed    Diet Carb Modified   Complete by: As directed    Discharge wound care:   Complete by: As  directed    As prior to admission   Increase activity slowly   Complete by: As directed       Discharge Medications   Allergies as of 04/27/2021      Reactions   Erythromycin Hives   Iodinated Diagnostic Agents Swelling   Levofloxacin Hives, Swelling   Oxycodone-acetaminophen Anaphylaxis   "Percocet" "Percocet"   Penicillins Anaphylaxis   Hypotension   Rosuvastatin Rash, Hives, Other (See Comments)   Muscle Cramps and weakness   Sulfa Antibiotics Hives      Medication List    TAKE these medications   albuterol 108 (90 Base) MCG/ACT inhaler Commonly known as: VENTOLIN HFA Inhale 1-2 puffs into the lungs every 6 (six) hours as needed for wheezing or shortness of breath.   ascorbic acid 500 MG tablet Commonly known as: VITAMIN C Take 500 mg by mouth in the morning.    B-complex with vitamin C tablet Take 1 tablet by mouth 3 (three) times a week. (on cleaning days)   Biotin 5000 MCG Tabs Take 5,000 mcg by mouth in the morning.   clopidogrel 75 MG tablet Commonly known as: PLAVIX Take 1 tablet (75 mg total) by mouth daily with breakfast. Start taking on: April 28, 2021 Notes to patient: Take for 30 days and stop   COSAMIN DS PO Take 2 tablets by mouth in the morning.   dapagliflozin propanediol 10 MG Tabs tablet Commonly known as: FARXIGA Take 10 mg by mouth in the morning.   DULoxetine 60 MG capsule Commonly known as: CYMBALTA Take 60 mg by mouth 2 (two) times daily.   Eliquis 5 MG Tabs tablet Generic drug: apixaban Take 5 mg by mouth 2 (two) times daily.   ferrous sulfate 325 (65 FE) MG tablet Take 975 mg by mouth in the morning.   Fish Oil 1000 MG Caps Take 1,000 mg by mouth every other day. In the morning   gabapentin 600 MG tablet Commonly known as: NEURONTIN Take 1,200 mg by mouth 2 (two) times daily.   gemfibrozil 600 MG tablet Commonly known as: LOPID Take 600 mg by mouth 2 (two) times daily.   hydrOXYzine 25 MG tablet Commonly known as: ATARAX/VISTARIL Take 75 mg by mouth in the morning.   insulin glargine 100 UNIT/ML Solostar Pen Commonly known as: LANTUS Inject 17 Units into the skin daily.   lipase/protease/amylase 40981 UNITS Cpep capsule Commonly known as: CREON Take 36,000-72,000 Units by mouth See admin instructions.   lisinopril 30 MG tablet Commonly known as: ZESTRIL Take 30 mg by mouth in the morning.   Lumify 0.025 % Soln Generic drug: Brimonidine Tartrate Place 1 drop into both eyes daily as needed (tired eyes).   magnesium oxide 400 MG tablet Commonly known as: MAG-OX Take 400 mg by mouth in the morning.   metFORMIN 500 MG 24 hr tablet Commonly known as: GLUCOPHAGE-XR Take 1,000 mg by mouth 2 (two) times daily.   metoprolol succinate 50 MG 24 hr tablet Commonly known as: TOPROL-XL Take 50  mg by mouth in the morning.   mirabegron ER 25 MG Tb24 tablet Commonly known as: MYRBETRIQ Take 25 mg by mouth in the morning.   nicotine 14 mg/24hr patch Commonly known as: NICODERM CQ - dosed in mg/24 hours Place 1 patch (14 mg total) onto the skin daily. Start taking on: April 28, 2021   nitroGLYCERIN 0.4 MG SL tablet Commonly known as: NITROSTAT Place 0.4 mg under the tongue every 5 (five) minutes x 3 doses  as needed for chest pain.   pantoprazole 40 MG tablet Commonly known as: PROTONIX Take 40 mg by mouth in the morning.   predniSONE 50 MG tablet Commonly known as: DELTASONE Take 1 tablet 13 hours prior to procedure, 1 tablet 7 hours prior to procedure, and 1 tablet (with Benedryl 50 mg) prior to going to the hospital for procedure   Repatha SureClick 140 MG/ML Soaj Generic drug: Evolocumab Inject 140 mg into the skin every 14 (fourteen) days.   Toviaz 8 MG Tb24 tablet Generic drug: fesoterodine Take 8 mg by mouth in the morning.   triamcinolone ointment 0.5 % Commonly known as: KENALOG Apply 1 application topically 2 (two) times daily as needed (sores/skin irritation).   Trulicity 3 MG/0.5ML Sopn Generic drug: Dulaglutide Inject 3 mg into the skin every Tuesday.   VITAMIN A PO Take 3,000 mcg by mouth every other day. In the morning   Vitamin E 450 MG (1000 UT) Caps Take 1,000 Units by mouth in the morning.            Discharge Care Instructions  (From admission, onward)         Start     Ordered   04/27/21 0000  Discharge wound care:       Comments: As prior to admission   04/27/21 0909             Outstanding Labs/Studies   None  Duration of Discharge Encounter   Greater than 30 minutes including physician time.  Signed, Theodore Demarkhonda Barrett, PA-C 04/27/2021, 9:12 AM   I have personally seen and examined this patient. I agree with the assessment and plan as outlined above.  Stable post PV intervention.  Groins stable.  Renal function  stable.  D/c home today.   Verne CarrowChristopher Cayton Cuevas 04/27/2021 10:06 AM

## 2021-05-01 ENCOUNTER — Other Ambulatory Visit (HOSPITAL_COMMUNITY): Payer: Self-pay | Admitting: Cardiovascular Disease

## 2021-05-01 ENCOUNTER — Other Ambulatory Visit (HOSPITAL_COMMUNITY): Payer: Self-pay | Admitting: Internal Medicine

## 2021-05-01 DIAGNOSIS — K3184 Gastroparesis: Secondary | ICD-10-CM

## 2021-05-01 DIAGNOSIS — Z95828 Presence of other vascular implants and grafts: Secondary | ICD-10-CM

## 2021-05-03 ENCOUNTER — Other Ambulatory Visit (HOSPITAL_COMMUNITY): Payer: Self-pay | Admitting: Cardiovascular Disease

## 2021-05-03 ENCOUNTER — Other Ambulatory Visit: Payer: Self-pay

## 2021-05-03 ENCOUNTER — Ambulatory Visit (HOSPITAL_COMMUNITY)
Admission: RE | Admit: 2021-05-03 | Discharge: 2021-05-03 | Disposition: A | Discharge status: Home / Self Care | Payer: Medicare Other | Source: Ambulatory Visit | Attending: Cardiovascular Disease | Admitting: Cardiovascular Disease

## 2021-05-03 ENCOUNTER — Ambulatory Visit (HOSPITAL_BASED_OUTPATIENT_CLINIC_OR_DEPARTMENT_OTHER)
Admit: 2021-05-03 | Discharge: 2021-05-03 | Disposition: A | Discharge status: Home / Self Care | Payer: Medicare Other | Attending: Cardiovascular Disease | Admitting: Cardiovascular Disease

## 2021-05-03 DIAGNOSIS — Z95828 Presence of other vascular implants and grafts: Secondary | ICD-10-CM

## 2021-05-03 DIAGNOSIS — I739 Peripheral vascular disease, unspecified: Secondary | ICD-10-CM | POA: Insufficient documentation

## 2021-05-08 ENCOUNTER — Encounter (HOSPITAL_COMMUNITY): Payer: Self-pay

## 2021-05-08 ENCOUNTER — Encounter (HOSPITAL_COMMUNITY)
Admission: RE | Admit: 2021-05-08 | Discharge: 2021-05-08 | Disposition: A | Discharge status: Home / Self Care | Payer: Medicare Other | Source: Ambulatory Visit | Attending: Internal Medicine | Admitting: Internal Medicine

## 2021-05-08 ENCOUNTER — Other Ambulatory Visit (HOSPITAL_COMMUNITY): Payer: Medicare Other

## 2021-05-08 DIAGNOSIS — K3184 Gastroparesis: Secondary | ICD-10-CM | POA: Diagnosis not present

## 2021-05-08 MED ORDER — TECHNETIUM TC 99M SULFUR COLLOID
2.0000 | Freq: Once | INTRAVENOUS | Status: AC | PRN
  Administered 2021-05-08: 1.91 via ORAL

## 2021-05-09 ENCOUNTER — Other Ambulatory Visit: Payer: Self-pay

## 2021-05-09 ENCOUNTER — Encounter: Payer: Self-pay | Admitting: Cardiovascular Disease

## 2021-05-09 ENCOUNTER — Ambulatory Visit (INDEPENDENT_AMBULATORY_CARE_PROVIDER_SITE_OTHER): Payer: Medicare Other | Admitting: Cardiovascular Disease

## 2021-05-09 VITALS — BP 84/62 | HR 78 | Ht 69.0 in | Wt 209.0 lb

## 2021-05-09 DIAGNOSIS — I739 Peripheral vascular disease, unspecified: Secondary | ICD-10-CM | POA: Diagnosis not present

## 2021-05-09 DIAGNOSIS — E785 Hyperlipidemia, unspecified: Secondary | ICD-10-CM | POA: Diagnosis not present

## 2021-05-09 DIAGNOSIS — I480 Paroxysmal atrial fibrillation: Secondary | ICD-10-CM | POA: Diagnosis not present

## 2021-05-09 DIAGNOSIS — I10 Essential (primary) hypertension: Secondary | ICD-10-CM

## 2021-05-09 DIAGNOSIS — Z720 Tobacco use: Secondary | ICD-10-CM

## 2021-05-09 NOTE — Progress Notes (Signed)
Cardiology Office Note   Date:  05/09/2021   ID:  Kerry Randall, DOB Jun 14, 1961, MRN [id number]  PCP:  Kerry Rud, PA-C  Cardiologist:   Kerry Bears, MD   No chief complaint on file.     History of Present Illness: Kerry Randall is a 60 y.o. female who is here today for follow-up visit regarding peripheral arterial disease.   She has known history of coronary artery disease, essential hypertension, stage III chronic kidney disease, type 2 diabetes, stroke, tobacco use and peripheral arterial disease.  There is reported history of paroxysmal atrial fibrillation on previous monitor.  She was started on Eliquis in April for this. She is status post RCA drug-eluting stent placement in 2016.  She reports prolonged history of diabetic foot ulceration.  She is status post right metatarsal amputation about 5 years ago with recurrent nonhealing ulceration on the dorsal side of the foot.  In addition, she is status post amputation of the left small toe.  She underwent abdominal aortogram with right lower extremity angiography using CO2 by Dr. Isabell Jarvis in July 2021.  There was no significant aortoiliac disease.  There was multilevel nonflow limiting disease affecting the right SFA and two-vessel runoff below the knee via the anterior tibial and posterior tibial arteries.  No intervention was needed.    She is followed by Dr. Romualdo Bolk in podiatry at [facility].  She has chronic nonhealing ulceration on the dorsal side of the right foot.  This is approximately 2 x 2 inches and about half centimeter deep.  She had venous Doppler study done in March which was negative for DVT.  She underwent lower extremity arterial Doppler studies in March which showed bilateral ABI of 0.65.  Duplex showed possible severe stenosis in bilateral SFA.  Velocity in the distal right SFA was 436 cm/s.  Based on this, I proceeded with angiography earlier this month which showed significant stenosis in the  ostial right common iliac artery, severe distal SFA disease and three-vessel runoff below the knee.  On the left side, there was moderate left common femoral artery stenosis.  I performed successful drug-coated balloon angioplasty to the distal right SFA and balloon expandable stent placement to the right common iliac artery.  The patient was hydrated overnight due to chronic kidney disease.  She was placed on Plavix to be used for 1 month only given that she is on Eliquis. Postprocedural vascular studies showed an ABI of 0.87 on the right and 0.86 on the left.  Duplex showed moderately elevated velocities in the right mid SFA with patent right common iliac artery.  She reports improvement in the ulceration.  She does complain of being dizzy and lightheaded and her blood pressure is low today.  She reports intermittent hypotension at home.   Past Medical History:  Diagnosis Date   Atrial fibrillation (HCC)    Colon polyps    Diabetes mellitus without complication (HCC)    GERD (gastroesophageal reflux disease)    Heart disease, hyperkinetic    Hypercholesteremia    Hypertension    Kidney disease    Leg pain    Major depressive disorder, recurrent (HCC)    Palpitations    Vitamin D deficiency     Past Surgical History:  Procedure Laterality Date   ABDOMINAL AORTOGRAM W/LOWER EXTREMITY N/A 04/26/2021   Procedure: ABDOMINAL AORTOGRAM W/LOWER EXTREMITY;  Surgeon: Iran Ouch, MD;  Location: [facility];  Service: Cardiovascular;  Laterality: N/A;   AMPUTATION  TOE Bilateral    small toe on left foot; all toes on right foot   PERIPHERAL VASCULAR BALLOON ANGIOPLASTY Right 04/26/2021   Procedure: PERIPHERAL VASCULAR BALLOON ANGIOPLASTY;  Surgeon: Iran Ouch, MD;  Location: [facility] IN[facility]AS[facility] CV [facility];  Service: Cardiovascular;  Laterality: Right;  superficial femoral   PERIPHERAL VASCULAR INTERVENTION Right 04/26/2021   Procedure: PERIPHERAL VASCULAR INTERVENTION;  Surgeon: Iran Ouch, MD;  Location: [facility] CV [facility];  Service: Cardiovascular;  Laterality: Right;  Iliac stent   TOTAL VAGINAL HYSTERECTOMY  12/1998     Current Outpatient Medications  Medication Sig Dispense Refill   albuterol (VENTOLIN HFA) 108 (90 Base) MCG/ACT inhaler Inhale 1-2 puffs into the lungs every 6 (six) hours as needed for wheezing or shortness of breath.     apixaban (ELIQUIS) 5 MG TABS tablet Take 5 mg by mouth 2 (two) times daily.     ascorbic acid (VITAMIN C) 500 MG tablet Take 500 mg by mouth in the morning.     B Complex-C (B-COMPLEX WITH VITAMIN C) tablet Take 1 tablet by mouth 3 (three) times a week. (on cleaning days)     Biotin 5000 MCG TABS Take 5,000 mcg by mouth in the morning.     Brimonidine Tartrate (LUMIFY) 0.025 % SOLN Place 1 drop into both eyes daily as needed (tired eyes).     clopidogrel (PLAVIX) 75 MG tablet Take 1 tablet (75 mg total) by mouth daily with breakfast. 30 tablet 0   dapagliflozin propanediol (FARXIGA) 10 MG TABS tablet Take 10 mg by mouth in the morning.     Dulaglutide (TRULICITY) 3 MG/0.5ML SOPN Inject 3 mg into the skin every Tuesday.     DULoxetine (CYMBALTA) 60 MG capsule Take 60 mg by mouth 2 (two) times daily.     ferrous sulfate 325 (65 FE) MG tablet Take 975 mg by mouth in the morning.     fesoterodine (TOVIAZ) 8 MG TB24 tablet Take 8 mg by mouth in the morning.     gabapentin (NEURONTIN) 600 MG tablet Take 1,200 mg by mouth 2 (two) times daily.     gemfibrozil (LOPID) 600 MG tablet Take 600 mg by mouth 2 (two) times daily.     Glucosamine-Chondroitin (COSAMIN DS PO) Take 2 tablets by mouth in the morning.     hydrOXYzine (ATARAX/VISTARIL) 25 MG tablet Take 75 mg by mouth in the morning.     insulin glargine (LANTUS) 100 UNIT/ML Solostar Pen Inject 17 Units into the skin daily.     lipase/protease/amylase (CREON) 36000 UNITS CPEP capsule Take 36,000-72,000 Units by mouth See admin instructions.     lisinopril (ZESTRIL) 30 MG tablet Take  30 mg by mouth in the morning.     magnesium oxide (MAG-OX) 400 MG tablet Take 400 mg by mouth in the morning.     metFORMIN (GLUCOPHAGE-XR) 500 MG 24 hr tablet Take 1,000 mg by mouth 2 (two) times daily.     metoprolol succinate (TOPROL-XL) 50 MG 24 hr tablet Take 50 mg by mouth in the morning.     mirabegron ER (MYRBETRIQ) 25 MG TB24 tablet Take 25 mg by mouth in the morning.     nicotine (NICODERM CQ - DOSED IN MG/24 HOURS) 14 mg/24hr patch Place 1 patch (14 mg total) onto the skin daily. 28 patch 0   nitroGLYCERIN (NITROSTAT) 0.4 MG SL tablet Place 0.4 mg under the tongue every 5 (five) minutes x 3 doses as needed for chest pain.  Omega-3 Fatty Acids (FISH OIL) 1000 MG CAPS Take 1,000 mg by mouth every other day. In the morning     pantoprazole (PROTONIX) 40 MG tablet Take 40 mg by mouth in the morning.     REPATHA SURECLICK 140 MG/ML SOAJ Inject 140 mg into the skin every 14 (fourteen) days.     triamcinolone ointment (KENALOG) 0.5 % Apply 1 application topically 2 (two) times daily as needed (sores/skin irritation).     VITAMIN A PO Take 3,000 mcg by mouth every other day. In the morning     Vitamin E 450 MG (1000 UT) CAPS Take 1,000 Units by mouth in the morning.     No current facility-administered medications for this visit.    Allergies:   Erythromycin, Iodinated diagnostic agents, Levofloxacin, Oxycodone-acetaminophen, Penicillins, Rosuvastatin, and Sulfa antibiotics    Social History:  The patient  reports that she has been smoking. She has never used smokeless tobacco.   Family History:  The patient's family history includes Anxiety disorder in her daughter; Brain cancer in her sister; Depression in her daughter and sister; Diabetes type II in her daughter and mother; Heart disease in her mother and paternal aunt; Hyperlipidemia in her maternal aunt, maternal uncle, paternal aunt, and paternal uncle; Hypertension in her maternal aunt, maternal uncle, mother, paternal aunt,  paternal uncle, and sister; Lupus in her paternal grandmother; Throat cancer in her father.    ROS:  Please see the history of present illness.   Otherwise, review of systems are positive for none.   All other systems are reviewed and negative.    PHYSICAL EXAM: VS:  BP (!) 84/62 (BP Location: Left Arm, Patient Position: Sitting, Cuff Size: Normal)   Pulse 78   Ht 5\' 9"  (1.753 m)   Wt 209 lb (94.8 kg)   BMI 30.86 kg/m  , BMI Body mass index is 30.86 kg/m. GEN: Well nourished, well developed, in no acute distress  HEENT: normal  Neck: no JVD, carotid bruits, or masses Cardiac: RRR; no murmurs, rubs, or gallops,no edema  Respiratory:  clear to auscultation bilaterally, normal work of breathing GI: soft, nontender, nondistended, + BS MS: no deformity or atrophy  Skin: warm and dry, no rash Neuro:  Strength and sensation are intact Psych: euthymic mood, full affect No groin hematoma on both sides.  EKG:  EKG is not ordered today.    Recent Labs: 04/18/2021: Hemoglobin 12.1; Platelets 235 04/27/2021: BUN 26; Creatinine, Ser 1.16; Potassium 4.5; Sodium 138    Lipid Panel No results found for: CHOL, TRIG, HDL, CHOLHDL, VLDL, LDLCALC, LDLDIRECT    Wt Readings from Last 3 Encounters:  05/09/21 209 lb (94.8 kg)  04/26/21 212 lb (96.2 kg)  04/18/21 212 lb 12.8 oz (96.5 kg)       No flowsheet data found.    ASSESSMENT AND PLAN:  1.  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  Status post recent right SFA drug-coated balloon angioplasty and right common iliac artery stent placement with significant improvement in ABI.   Continue clopidogrel until the end of the month then discontinue to minimize risk of bleeding given that she is on Eliquis. Will repeat Doppler studies in 6 months.  2.  Paroxysmal atrial fibrillation: This was diagnosed based on outpatient monitor.   She is currently on Eliquis.  3. Hyperlipidemia: Recent lipid profile showed an LDL of 38..  There is reported history of intolerance  to statins due to myalgia and currently she is on Lopid and Repatha.  4.  Essential hypertension: She is hypertensive today and mildly dizzy.  Will check CBC and basic metabolic profile.  I asked her to hold lisinopril until systolic blood pressure is above 120.  5.  Coronary artery disease involving native coronary arteries without angina: Previous stent placement to the right coronary artery.  Continue medical therapy.  6.  Tobacco use: She quit smoking recently but reports relapse with few cigarettes a day.  I discussed with her the importance of complete cessation.   Disposition: FU with me in 3 months.  Signed,  Kerry Bears, MD  05/09/2021 9:46 AM    [facility]

## 2021-05-09 NOTE — Patient Instructions (Signed)
Medication Instructions:   -May stop plavix at the end of June.  -Hold lisinopril if systolic (top number) is less than 120.   *If you need a refill on your cardiac medications before your next appointment, please call your pharmacy*   Lab Work: Your physician recommends that have labs drawn today: BMET and CBC  If you have labs (blood work) drawn today and your tests are completely normal, you will receive your results only by: MyChart Message (if you have MyChart) OR A paper copy in the mail If you have any lab test that is abnormal or we need to change your treatment, we will call you to review the results.   Follow-Up: At [facility], you and your health needs are our priority.  As part of our continuing mission to provide you with exceptional heart care, we have created designated Provider Care Teams.  These Care Teams include your primary Cardiologist (physician) and Advanced Practice Providers (APPs -  Physician Assistants and Nurse Practitioners) who all work together to provide you with the care you need, when you need it.  We recommend signing up for the patient portal called "MyChart".  Sign up information is provided on this After Visit Summary.  MyChart is used to connect with patients for Virtual Visits (Telemedicine).  Patients are able to view lab/test results, encounter notes, upcoming appointments, etc.  Non-urgent messages can be sent to your provider as well.   To learn more about what you can do with MyChart, go to [email or web address].    Your next appointment:   3 month(s)  The format for your next appointment:   In Person  Provider:   [name], MD

## 2021-05-10 ENCOUNTER — Telehealth: Payer: Self-pay | Admitting: Home Health

## 2021-05-10 ENCOUNTER — Other Ambulatory Visit: Payer: Self-pay | Admitting: *Deleted

## 2021-05-10 ENCOUNTER — Other Ambulatory Visit: Payer: Self-pay | Admitting: Home Health

## 2021-05-10 ENCOUNTER — Telehealth: Payer: Self-pay | Admitting: Cardiology

## 2021-05-10 DIAGNOSIS — E875 Hyperkalemia: Secondary | ICD-10-CM

## 2021-05-10 LAB — CBC
Hematocrit: 40.3 % (ref 34.0–46.6)
Hemoglobin: 13.1 g/dL (ref 11.1–15.9)
MCH: 29.5 pg (ref 26.6–33.0)
MCHC: 32.5 g/dL (ref 31.5–35.7)
MCV: 91 fL (ref 79–97)
Platelets: 257 10*3/uL (ref 150–450)
RBC: 4.44 x10E6/uL (ref 3.77–5.28)
RDW: 14 % (ref 11.7–15.4)
WBC: 10.6 10*3/uL (ref 3.4–10.8)

## 2021-05-10 LAB — BASIC METABOLIC PANEL
BUN/Creatinine Ratio: 19 (ref 12–28)
BUN: 30 mg/dL — ABNORMAL HIGH (ref 8–27)
CO2: 18 mmol/L — ABNORMAL LOW (ref 20–29)
Calcium: 9.7 mg/dL (ref 8.7–10.3)
Chloride: 106 mmol/L (ref 96–106)
Creatinine, Ser: 1.57 mg/dL — ABNORMAL HIGH (ref 0.57–1.00)
Glucose: 103 mg/dL — ABNORMAL HIGH (ref 65–99)
Potassium: 6.1 mmol/L (ref 3.5–5.2)
Sodium: 140 mmol/L (ref 134–144)
eGFR: 37 mL/min/{1.73_m2} — ABNORMAL LOW (ref >59)

## 2021-05-10 NOTE — Telephone Encounter (Signed)
Cardiology on-call, 12:07am Contacted by [facility] regarding BMP checked yesterday. Critical result K 6.1. Pt has recent hyperkalemia up to 5.6. Notes indicate pt's cardiologist instructed her to hold lisinopril as of yesterday. Attempted to contact pt by phone regarding this result, but was unable to contact the patient/no answer.  Precious Reel, MD , [facility] 12:08 AM

## 2021-05-10 NOTE — Telephone Encounter (Signed)
Received sign out from overnight MD, reporting patient's K 6.1 on BMP on 05/09/21 from Dr Kirke Corin office. Patient was not answering her phone last night. Called patient again this morning, she states she is doing well, has been up since 4 AM cleaning the house, feels a little tired. She states she has been struggling with high potassium level for a while. She denied any heart palpitation, irregular HR, or dizziness. She has stopped her lisinopril as instructed in the office. She checked BP last night and it was better than the office visit reading.   Advised patient to increased water intake today, HOLD lisinopril and metformin due to worsening Cr and rising K. Avoid potassium rich food. Repeat BMP this PM or tomorrow AM at latest. She states she is not sure if she can get her blood workup this week. Discussed with patient that possible cardiac complication can occur with AKI and hyperkalemia,  it's vital for her to repeat labs after above measures to ensure renal function and K are not worsening. Advised patient to go to nearest ER if she is having chest pain, SOB, dizziness, heart palpitation. She eventually agreed to call her daughter to take her to [facility] office for blood workup today. BMP script written, advised patient stop by anytime 8-4, no appointment needed.

## 2021-05-10 NOTE — Progress Notes (Signed)
BMP ordered, advised patient to obtain blood work this afternoon or tomorrow morning.

## 2021-05-16 LAB — BASIC METABOLIC PANEL
BUN/Creatinine Ratio: 16 (ref 12–28)
BUN: 18 mg/dL (ref 8–27)
CO2: 20 mmol/L (ref 20–29)
Calcium: 9.5 mg/dL (ref 8.7–10.3)
Chloride: 106 mmol/L (ref 96–106)
Creatinine, Ser: 1.15 mg/dL — ABNORMAL HIGH (ref 0.57–1.00)
Glucose: 127 mg/dL — ABNORMAL HIGH (ref 65–99)
Potassium: 4.7 mmol/L (ref 3.5–5.2)
Sodium: 142 mmol/L (ref 134–144)
eGFR: 54 mL/min/{1.73_m2} — ABNORMAL LOW (ref >59)

## 2021-05-25 ENCOUNTER — Telehealth: Payer: Self-pay | Admitting: *Deleted

## 2021-05-25 NOTE — Telephone Encounter (Signed)
Spoke to the patient about her recent lab results and instructions to hold her Lisinopril still. She stated that when she does her blood pressure goes up and stays around the 170/90's. She started retaking her Lisinopril and her blood pressure has stablized at 120/80's.  The patient also stated that she has been having right leg pain. The pain is in her lower leg and is painful to the touch. She stated that it is only painful on her lower leg and only hurts when she touches it. She denies any color or temperature change. She was offered an appointment but declined stating that she will wait and see it goes.

## 2021-05-26 NOTE — Telephone Encounter (Signed)
Okay to resume lisinopril.  Monitor leg pain for now let us know if it does not improve.

## 2021-05-30 NOTE — Telephone Encounter (Signed)
Patient was returning call 

## 2021-05-30 NOTE — Telephone Encounter (Signed)
Called patient back, advised of message below from MD.  Patient verbalized understanding.

## 2021-05-30 NOTE — Telephone Encounter (Signed)
Left a message for the patient to call back.  

## 2021-06-20 ENCOUNTER — Other Ambulatory Visit: Payer: Self-pay

## 2021-06-20 MED ORDER — CLOPIDOGREL BISULFATE 75 MG PO TABS
75.0000 mg | ORAL_TABLET | Freq: Every day | ORAL | 1 refills | Status: DC
Start: 2021-06-20 — End: 2021-08-22

## 2021-07-21 ENCOUNTER — Emergency Department (HOSPITAL_COMMUNITY)
Admission: EM | Admit: 2021-07-21 | Discharge: 2021-07-21 | Disposition: A | Discharge status: Home / Self Care | Payer: Medicare Other | Attending: Emergency Medicine | Admitting: Emergency Medicine

## 2021-07-21 ENCOUNTER — Other Ambulatory Visit: Payer: Self-pay

## 2021-07-21 ENCOUNTER — Emergency Department (HOSPITAL_COMMUNITY): Payer: Medicare Other

## 2021-07-21 DIAGNOSIS — Z79899 Other long term (current) drug therapy: Secondary | ICD-10-CM | POA: Insufficient documentation

## 2021-07-21 DIAGNOSIS — Z794 Long term (current) use of insulin: Secondary | ICD-10-CM | POA: Insufficient documentation

## 2021-07-21 DIAGNOSIS — Y9389 Activity, other specified: Secondary | ICD-10-CM | POA: Insufficient documentation

## 2021-07-21 DIAGNOSIS — W01198A Fall on same level from slipping, tripping and stumbling with subsequent striking against other object, initial encounter: Secondary | ICD-10-CM | POA: Insufficient documentation

## 2021-07-21 DIAGNOSIS — W19XXXA Unspecified fall, initial encounter: Secondary | ICD-10-CM

## 2021-07-21 DIAGNOSIS — Y92007 Garden or yard of unspecified non-institutional (private) residence as the place of occurrence of the external cause: Secondary | ICD-10-CM | POA: Diagnosis not present

## 2021-07-21 DIAGNOSIS — E119 Type 2 diabetes mellitus without complications: Secondary | ICD-10-CM | POA: Insufficient documentation

## 2021-07-21 DIAGNOSIS — I10 Essential (primary) hypertension: Secondary | ICD-10-CM | POA: Insufficient documentation

## 2021-07-21 DIAGNOSIS — I4891 Unspecified atrial fibrillation: Secondary | ICD-10-CM | POA: Insufficient documentation

## 2021-07-21 DIAGNOSIS — Z7984 Long term (current) use of oral hypoglycemic drugs: Secondary | ICD-10-CM | POA: Diagnosis not present

## 2021-07-21 DIAGNOSIS — M25551 Pain in right hip: Secondary | ICD-10-CM | POA: Diagnosis not present

## 2021-07-21 DIAGNOSIS — F17200 Nicotine dependence, unspecified, uncomplicated: Secondary | ICD-10-CM | POA: Insufficient documentation

## 2021-07-21 DIAGNOSIS — Z7901 Long term (current) use of anticoagulants: Secondary | ICD-10-CM | POA: Insufficient documentation

## 2021-07-21 IMAGING — DX DG PELVIS 1-2V
1 series · 1 of 1 positions shown · non-contrast
Comparison: [DATE]

CLINICAL DATA: Post fall

EXAM:
PELVIS - 1-2 VIEW

[pelvis ap]
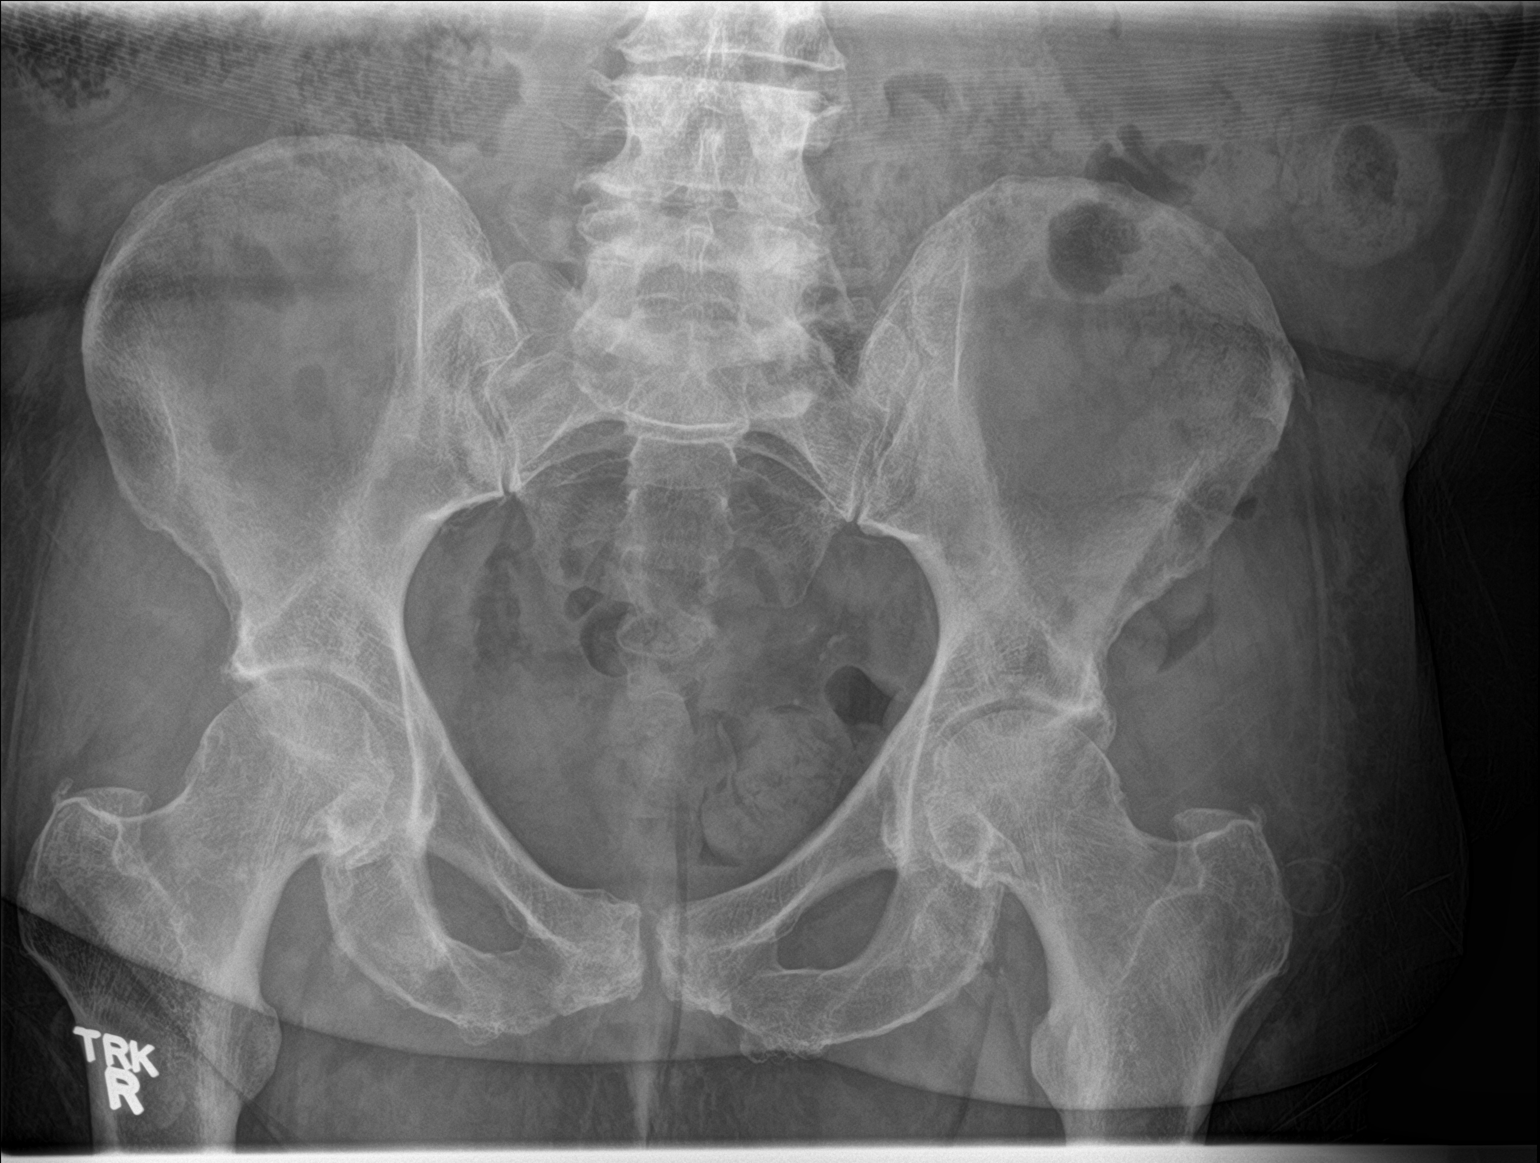

[1 of 1 positions shown; findings below may reference images not displayed]

FINDINGS: SI joints are non widened. Pubic symphysis and rami appear intact.
Mild degenerative changes of both hips. No fracture or malalignment.
IMPRESSION: No acute osseous abnormality

## 2021-07-21 MED ORDER — TRAMADOL HCL 50 MG PO TABS
50.0000 mg | ORAL_TABLET | Freq: Once | ORAL | Status: AC
  Administered 2021-07-21: 50 mg via ORAL
  Filled 2021-07-21: qty 1

## 2021-07-21 MED ORDER — TRAMADOL HCL 50 MG PO TABS
50.0000 mg | ORAL_TABLET | Freq: Four times a day (QID) | ORAL | 0 refills | Status: DC | PRN
Start: 2021-07-21 — End: 2022-04-07

## 2021-07-21 MED ORDER — MORPHINE SULFATE (PF) 2 MG/ML IV SOLN
2.0000 mg | Freq: Once | INTRAVENOUS | Status: AC
  Administered 2021-07-21: 2 mg via INTRAVENOUS
  Filled 2021-07-21: qty 1

## 2021-07-21 NOTE — ED Notes (Signed)
Pt attempted to ambulated. Pt endorses pain with weight bearing movement. Provided pt with ice pack and was placed on knee. Pt refused knee immobilizer and cructches

## 2021-07-21 NOTE — ED Triage Notes (Signed)
Pt here via [facility] d/t mechanical fall.  Pt chronically wears right foot boot. Pt outside working in yard, slipped and leg fell under her. Pt endorses right left pain from hip to knee.   93/47 after 1L of NS HR 97 95% RA  RR 20 CBG 136

## 2021-07-21 NOTE — Discharge Instructions (Addendum)
Return for any problem.  ?

## 2021-07-21 NOTE — ED Provider Notes (Signed)
[facility] EMERGENCY DEPARTMENT Provider Note   CSN: [id number] Arrival date & time: 07/21/21  1555     History Chief Complaint  Patient presents with   Kerry Randall    Kerry Randall is a 60 y.o. female.  60 year old female with prior medical history as detailed below presents for evaluation.  Patient was at home when her right foot caught on an object on the ground.  She tripped and fell.  Her right leg folded backwards underneath her.  She presents complaining of pain to the right hip and right upper leg.  She was unable to ambulate after the fall.  She reports significant pain to the right hip area.  She is unable to lift the right leg off the stretcher.  She denies head injury.  She denies loss of conscious.  She denies neck pain.  She denies other extremity injury.  She is alert and appears to be fairly comfortable on initial evaluation.  EMS reports that her blood pressure was somewhat low during transport.  Systolics in the 90s reported.  Patient received 1 L normal saline while in route.  The history is provided by the patient.  Fall This is a new problem. The current episode started 1 to 2 hours ago. The problem occurs constantly. The problem has not changed since onset.Pertinent negatives include no chest pain and no abdominal pain. Nothing aggravates the symptoms. Nothing relieves the symptoms.      Past Medical History:  Diagnosis Date   Atrial fibrillation (HCC)    Colon polyps    Diabetes mellitus without complication (HCC)    GERD (gastroesophageal reflux disease)    Heart disease, hyperkinetic    Hypercholesteremia    Hypertension    Kidney disease    Leg pain    Major depressive disorder, recurrent (HCC)    Palpitations    Vitamin D deficiency     Patient Active Problem List   Diagnosis Date Noted   Critical lower limb ischemia (HCC) 04/26/2021    Past Surgical History:  Procedure Laterality Date   ABDOMINAL AORTOGRAM W/LOWER EXTREMITY N/A  04/26/2021   Procedure: ABDOMINAL AORTOGRAM W/LOWER EXTREMITY;  Surgeon: Iran Ouch, MD;  Location: [facility] INVASIVE CV LAB;  Service: Cardiovascular;  Laterality: N/A;   AMPUTATION TOE Bilateral    small toe on left foot; all toes on right foot   PERIPHERAL VASCULAR BALLOON ANGIOPLASTY Right 04/26/2021   Procedure: PERIPHERAL VASCULAR BALLOON ANGIOPLASTY;  Surgeon: Iran Ouch, MD;  Location: [facility] INVASIVE CV LAB;  Service: Cardiovascular;  Laterality: Right;  superficial femoral   PERIPHERAL VASCULAR INTERVENTION Right 04/26/2021   Procedure: PERIPHERAL VASCULAR INTERVENTION;  Surgeon: Iran Ouch, MD;  Location: [facility] INVASIVE CV LAB;  Service: Cardiovascular;  Laterality: Right;  Iliac stent   TOTAL VAGINAL HYSTERECTOMY  12/1998     OB History   No obstetric history on file.     Family History  Problem Relation Age of Onset   Hypertension Mother    Diabetes type II Mother    Heart disease Mother    Throat cancer Father    Hypertension Sister    Depression Sister    Brain cancer Sister    Hypertension Maternal Aunt    Hyperlipidemia Maternal Aunt    Hypertension Maternal Uncle    Hyperlipidemia Maternal Uncle    Hypertension Paternal Aunt    Hyperlipidemia Paternal Aunt    Heart disease Paternal Aunt    Hypertension Paternal Uncle    Hyperlipidemia  Paternal Uncle    Diabetes type II Daughter    Depression Daughter    Anxiety disorder Daughter    Lupus Paternal Grandmother     Social History   Tobacco Use   Smoking status: Smoker, Current Status Unknown   Smokeless tobacco: Never    Home Medications Prior to Admission medications   Medication Sig Start Date End Date Taking? Authorizing Provider  albuterol (VENTOLIN HFA) 108 (90 Base) MCG/ACT inhaler Inhale 1-2 puffs into the lungs every 6 (six) hours as needed for wheezing or shortness of breath.    [provider]  apixaban (ELIQUIS) 5 MG TABS tablet Take 5 mg by mouth 2 (two) times daily. 03/06/21    [provider]  ascorbic acid (VITAMIN C) 500 MG tablet Take 500 mg by mouth in the morning. 06/27/17   [provider]  B Complex-C (B-COMPLEX WITH VITAMIN C) tablet Take 1 tablet by mouth 3 (three) times a week. (on cleaning days)    [provider]  Biotin 5000 MCG TABS Take 5,000 mcg by mouth in the morning.    [provider]  Brimonidine Tartrate (LUMIFY) 0.025 % SOLN Place 1 drop into both eyes daily as needed (tired eyes).    [provider]  clopidogrel (PLAVIX) 75 MG tablet Take 1 tablet (75 mg total) by mouth daily with breakfast. 06/20/21   Iran OuchArida, Muhammad A, MD  dapagliflozin propanediol (FARXIGA) 10 MG TABS tablet Take 10 mg by mouth in the morning.    [provider]  Dulaglutide (TRULICITY) 3 MG/0.5ML SOPN Inject 3 mg into the skin every Tuesday.    [provider]  DULoxetine (CYMBALTA) 60 MG capsule Take 60 mg by mouth 2 (two) times daily. 08/31/20   [provider]  ferrous sulfate 325 (65 FE) MG tablet Take 975 mg by mouth in the morning.    [provider]  fesoterodine (TOVIAZ) 8 MG TB24 tablet Take 8 mg by mouth in the morning. 03/11/15   [provider]  gabapentin (NEURONTIN) 600 MG tablet Take 1,200 mg by mouth 2 (two) times daily. 03/17/15   [provider]  gemfibrozil (LOPID) 600 MG tablet Take 600 mg by mouth 2 (two) times daily. 10/14/20   [provider]  Glucosamine-Chondroitin (COSAMIN DS PO) Take 2 tablets by mouth in the morning.    [provider]  hydrOXYzine (ATARAX/VISTARIL) 25 MG tablet Take 75 mg by mouth in the morning. 02/20/21   [provider]  insulin glargine (LANTUS) 100 UNIT/ML Solostar Pen Inject 17 Units into the skin daily. 03/04/20   [provider]  lipase/protease/amylase (CREON) 36000 UNITS CPEP capsule Take 36,000-72,000 Units by mouth See admin instructions.    [provider]  lisinopril (ZESTRIL) 30 MG  tablet Take 30 mg by mouth in the morning. 08/02/20   [provider]  magnesium oxide (MAG-OX) 400 MG tablet Take 400 mg by mouth in the morning.    [provider]  metFORMIN (GLUCOPHAGE-XR) 500 MG 24 hr tablet Take 1,000 mg by mouth 2 (two) times daily. 11/13/19   [provider]  metoprolol succinate (TOPROL-XL) 50 MG 24 hr tablet Take 50 mg by mouth in the morning. 10/14/20   [provider]  mirabegron ER (MYRBETRIQ) 25 MG TB24 tablet Take 25 mg by mouth in the morning. 09/20/20   [provider]  nicotine (NICODERM CQ - DOSED IN MG/24 HOURS) 14 mg/24hr patch Place 1 patch (14 mg total) onto the  skin daily. 04/28/21   Barrett, Joline Salt, PA-C  nitroGLYCERIN (NITROSTAT) 0.4 MG SL tablet Place 0.4 mg under the tongue every 5 (five) minutes x 3 doses as needed for chest pain. 06/20/20   [provider]  Omega-3 Fatty Acids (FISH OIL) 1000 MG CAPS Take 1,000 mg by mouth every other day. In the morning    [provider]  pantoprazole (PROTONIX) 40 MG tablet Take 40 mg by mouth in the morning. 02/15/21   [provider]  REPATHA SURECLICK 140 MG/ML SOAJ Inject 140 mg into the skin every 14 (fourteen) days. 03/27/21   [provider]  triamcinolone ointment (KENALOG) 0.5 % Apply 1 application topically 2 (two) times daily as needed (sores/skin irritation).    [provider]  VITAMIN A PO Take 3,000 mcg by mouth every other day. In the morning    [provider]  Vitamin E 450 MG (1000 UT) CAPS Take 1,000 Units by mouth in the morning.    [provider]    Allergies    Erythromycin, Iodinated diagnostic agents, Levofloxacin, Oxycodone-acetaminophen, Penicillins, Rosuvastatin, and Sulfa antibiotics  Review of Systems   Review of Systems  Cardiovascular:  Negative for chest pain.  Gastrointestinal:  Negative for abdominal pain.  All other systems reviewed and are negative.  Physical Exam Updated  Vital Signs BP 106/63 (BP Location: Right Arm)   Pulse 84   Temp 98.5 F (36.9 C) (Oral)   Resp (!) 21   SpO2 98%   Physical Exam Vitals and nursing note reviewed.  Constitutional:      General: She is not in acute distress.    Appearance: Normal appearance. She is well-developed.  HENT:     Head: Normocephalic and atraumatic.  Eyes:     Conjunctiva/sclera: Conjunctivae normal.     Pupils: Pupils are equal, round, and reactive to light.  Cardiovascular:     Rate and Rhythm: Normal rate and regular rhythm.     Heart sounds: Normal heart sounds.  Pulmonary:     Effort: Pulmonary effort is normal. No respiratory distress.     Breath sounds: Normal breath sounds.  Abdominal:     General: There is no distension.     Palpations: Abdomen is soft.     Tenderness: There is no abdominal tenderness.  Musculoskeletal:        General: Tenderness and signs of injury present. No deformity. Normal range of motion.     Cervical back: Normal range of motion and neck supple.     Comments: Significant tenderness to the lateral aspect of the right hip and right upper leg.  Skin:    General: Skin is warm and dry.  Neurological:     General: No focal deficit present.     Mental Status: She is alert and oriented to person, place, and time.    ED Results / Procedures / Treatments   Labs (all labs ordered are listed, but only abnormal results are displayed) Labs Reviewed - No data to display  EKG None  Radiology No results found.  Procedures Procedures   Medications Ordered in ED Medications - No data to display  ED Course  I have reviewed the triage vital signs and the nursing notes.  Pertinent labs & imaging results that were available during my care of the patient were reviewed by me and considered in my medical decision making (see chart for details).    MDM Rules/Calculators/A&P  MDM  MSE complete  Irys Nigh was evaluated in Emergency  Department on 07/21/2021 for the symptoms described in the history of present illness. She was evaluated in the context of the global COVID-19 pandemic, which necessitated consideration that the patient might be at risk for infection with the SARS-CoV-2 virus that causes COVID-19. Institutional protocols and algorithms that pertain to the evaluation of patients at risk for COVID-19 are in a state of rapid change based on information released by regulatory bodies including the CDC and federal and state organizations. These policies and algorithms were followed during the patient's care in the ED.   Patient is presenting with complaint of right hip and right upper leg injury after fall.  Patient is without evidence of other significant traumatic injury on exam.  Plain films are without evidence of fracture.  Exam is without evidence of significant instability in the right knee or right hip.  Patient feels improved after ED evaluation. Patient does understand need for close follow-up.  Strict return precautions given and understood.  Patient has used Ultram in the past for pain control.  Patient will be provided with a small amount of Ultram for pain control over the next several days.      Final Clinical Impression(s) / ED Diagnoses Final diagnoses:  Fall, initial encounter    Rx / DC Orders ED Discharge Orders     None        Wynetta Fines, MD 07/21/21 1904

## 2021-08-08 ENCOUNTER — Ambulatory Visit: Payer: Medicare Other | Admitting: Cardiovascular Disease

## 2021-08-22 ENCOUNTER — Ambulatory Visit (INDEPENDENT_AMBULATORY_CARE_PROVIDER_SITE_OTHER): Payer: Medicare Other | Admitting: Cardiovascular Disease

## 2021-08-22 ENCOUNTER — Encounter: Payer: Self-pay | Admitting: Cardiovascular Disease

## 2021-08-22 ENCOUNTER — Other Ambulatory Visit: Payer: Self-pay

## 2021-08-22 VITALS — BP 122/60 | HR 77 | Ht 69.0 in | Wt 209.0 lb

## 2021-08-22 DIAGNOSIS — I10 Essential (primary) hypertension: Secondary | ICD-10-CM

## 2021-08-22 DIAGNOSIS — Z720 Tobacco use: Secondary | ICD-10-CM

## 2021-08-22 DIAGNOSIS — E785 Hyperlipidemia, unspecified: Secondary | ICD-10-CM

## 2021-08-22 DIAGNOSIS — I2510 Atherosclerotic heart disease of native coronary artery without angina pectoris: Secondary | ICD-10-CM

## 2021-08-22 DIAGNOSIS — I480 Paroxysmal atrial fibrillation: Secondary | ICD-10-CM | POA: Diagnosis not present

## 2021-08-22 DIAGNOSIS — I739 Peripheral vascular disease, unspecified: Secondary | ICD-10-CM | POA: Diagnosis not present

## 2021-08-22 NOTE — Progress Notes (Signed)
Cardiology Office Note   Date:  08/22/2021   ID:  Kerry Randall, DOB 1961/07/13, MRN [id number]  PCP:  [facility]  Cardiologist:   [name], MD   Chief Complaint  Patient presents with   Follow-up    3 months.       History of Present Illness: Kerry Randall is a 60 y.o. female who is here today for follow-up visit regarding peripheral arterial disease.   She has known history of coronary artery disease, essential hypertension, stage III chronic kidney disease, type 2 diabetes, stroke, tobacco use and peripheral arterial disease.  There is reported history of paroxysmal atrial fibrillation on previous monitor on long-term anticoagulation with Eliquis. She is status post RCA drug-eluting stent placement in 2016.  She reports prolonged history of diabetic foot ulceration.  She is status post right metatarsal amputation with recurrent nonhealing ulceration on the dorsal side of the foot.  In addition, she is status post amputation of the left small toe.  She was seen few months ago for chronic nonhealing ulceration on the dorsal side of the right foot.    She had venous Doppler study done in March which was negative for DVT.  She underwent lower extremity arterial Doppler studies in March which showed bilateral ABI of 0.65.  Duplex showed possible severe stenosis in bilateral SFA.  Velocity in the distal right SFA was 436 cm/s.  I proceeded with angiography in June of this year which showed significant stenosis in the ostial right common iliac artery, severe distal SFA disease and three-vessel runoff below the knee.  On the left side, there was moderate left common femoral artery stenosis.  I performed successful drug-coated balloon angioplasty to the distal right SFA and balloon expandable stent placement to the right common iliac artery.  The patient was hydrated overnight due to chronic kidney disease.    Postprocedural vascular studies showed an ABI of 0.87 on the  right and 0.86 on the left.  Duplex showed moderately elevated velocities in the right mid SFA with patent right common iliac artery.  She has been doing well overall with no claudication.  The ulceration on the bottom of the right foot improved significantly but not yet completely healed.  Unfortunately, she fell 1 month ago and injured her right knee.   Past Medical History:  Diagnosis Date   Atrial fibrillation (HCC)    Colon polyps    Diabetes mellitus without complication (HCC)    GERD (gastroesophageal reflux disease)    Heart disease, hyperkinetic    Hypercholesteremia    Hypertension    Kidney disease    Leg pain    Major depressive disorder, recurrent (HCC)    Palpitations    Vitamin D deficiency     Past Surgical History:  Procedure Laterality Date   ABDOMINAL AORTOGRAM W/LOWER EXTREMITY N/A 04/26/2021   Procedure: ABDOMINAL AORTOGRAM W/LOWER EXTREMITY;  Surgeon: Iran Ouch, MD;  Location: [facility] INVASIVE CV LAB;  Service: Cardiovascular;  Laterality: N/A;   AMPUTATION TOE Bilateral    small toe on left foot; all toes on right foot   PERIPHERAL VASCULAR BALLOON ANGIOPLASTY Right 04/26/2021   Procedure: PERIPHERAL VASCULAR BALLOON ANGIOPLASTY;  Surgeon: Iran Ouch, MD;  Location: [facility] INVASIVE CV LAB;  Service: Cardiovascular;  Laterality: Right;  superficial femoral   PERIPHERAL VASCULAR INTERVENTION Right 04/26/2021   Procedure: PERIPHERAL VASCULAR INTERVENTION;  Surgeon: Iran Ouch, MD;  Location: [facility] INVASIVE CV LAB;  Service: Cardiovascular;  Laterality: Right;  Iliac stent   TOTAL VAGINAL HYSTERECTOMY  12/1998     Current Outpatient Medications  Medication Sig Dispense Refill   albuterol (VENTOLIN HFA) 108 (90 Base) MCG/ACT inhaler Inhale 1-2 puffs into the lungs every 6 (six) hours as needed for wheezing or shortness of breath.     apixaban (ELIQUIS) 5 MG TABS tablet Take 5 mg by mouth 2 (two) times daily.     ascorbic acid (VITAMIN C) 500 MG tablet Take  500 mg by mouth in the morning.     B Complex-C (B-COMPLEX WITH VITAMIN C) tablet Take 1 tablet by mouth 3 (three) times a week. (on cleaning days)     Biotin 5000 MCG TABS Take 5,000 mcg by mouth in the morning.     Brimonidine Tartrate (LUMIFY) 0.025 % SOLN Place 1 drop into both eyes daily as needed (tired eyes).     clopidogrel (PLAVIX) 75 MG tablet Take 1 tablet (75 mg total) by mouth daily with breakfast. 90 tablet 1   dapagliflozin propanediol (FARXIGA) 10 MG TABS tablet Take 10 mg by mouth in the morning.     Dulaglutide (TRULICITY) 3 MG/0.5ML SOPN Inject 3 mg into the skin every Tuesday.     DULoxetine (CYMBALTA) 60 MG capsule Take 60 mg by mouth 2 (two) times daily.     ferrous sulfate 325 (65 FE) MG tablet Take 975 mg by mouth in the morning.     fesoterodine (TOVIAZ) 8 MG TB24 tablet Take 8 mg by mouth in the morning.     gabapentin (NEURONTIN) 600 MG tablet Take 1,200 mg by mouth 2 (two) times daily.     gemfibrozil (LOPID) 600 MG tablet Take 600 mg by mouth 2 (two) times daily.     Glucosamine-Chondroitin (COSAMIN DS PO) Take 2 tablets by mouth in the morning.     hydrOXYzine (ATARAX/VISTARIL) 25 MG tablet Take 75 mg by mouth in the morning.     insulin glargine (LANTUS) 100 UNIT/ML Solostar Pen Inject 17 Units into the skin daily.     lipase/protease/amylase (CREON) 36000 UNITS CPEP capsule Take 36,000-72,000 Units by mouth See admin instructions.     lisinopril (ZESTRIL) 30 MG tablet Take 30 mg by mouth in the morning.     magnesium oxide (MAG-OX) 400 MG tablet Take 400 mg by mouth in the morning.     metFORMIN (GLUCOPHAGE-XR) 500 MG 24 hr tablet Take 1,000 mg by mouth 2 (two) times daily.     metoprolol succinate (TOPROL-XL) 50 MG 24 hr tablet Take 50 mg by mouth in the morning.     mirabegron ER (MYRBETRIQ) 25 MG TB24 tablet Take 25 mg by mouth in the morning.     nicotine (NICODERM CQ - DOSED IN MG/24 HOURS) 14 mg/24hr patch Place 1 patch (14 mg total) onto the skin daily. 28  patch 0   nitroGLYCERIN (NITROSTAT) 0.4 MG SL tablet Place 0.4 mg under the tongue every 5 (five) minutes x 3 doses as needed for chest pain.     Omega-3 Fatty Acids (FISH OIL) 1000 MG CAPS Take 1,000 mg by mouth every other day. In the morning     pantoprazole (PROTONIX) 40 MG tablet Take 40 mg by mouth in the morning.     REPATHA SURECLICK 140 MG/ML SOAJ Inject 140 mg into the skin every 14 (fourteen) days.     traMADol (ULTRAM) 50 MG tablet Take 1 tablet (50 mg total) by mouth every 6 (six) hours as needed. 15 tablet 0  triamcinolone ointment (KENALOG) 0.5 % Apply 1 application topically 2 (two) times daily as needed (sores/skin irritation).     VITAMIN A PO Take 3,000 mcg by mouth every other day. In the morning     Vitamin E 450 MG (1000 UT) CAPS Take 1,000 Units by mouth in the morning.     No current facility-administered medications for this visit.    Allergies:   Erythromycin, Iodinated diagnostic agents, Levofloxacin, Oxycodone-acetaminophen, Penicillins, Rosuvastatin, and Sulfa antibiotics    Social History:  The patient  reports that she has been smoking. She has never used smokeless tobacco.   Family History:  The patient's family history includes Anxiety disorder in her daughter; Brain cancer in her sister; Depression in her daughter and sister; Diabetes type II in her daughter and mother; Heart disease in her mother and paternal aunt; Hyperlipidemia in her maternal aunt, maternal uncle, paternal aunt, and paternal uncle; Hypertension in her maternal aunt, maternal uncle, mother, paternal aunt, paternal uncle, and sister; Lupus in her paternal grandmother; Throat cancer in her father.    ROS:  Please see the history of present illness.   Otherwise, review of systems are positive for none.   All other systems are reviewed and negative.    PHYSICAL EXAM: VS:  BP 122/60 (BP Location: Left Arm, Patient Position: Sitting, Cuff Size: Normal)   Pulse 77   Ht 5\' 9"  (1.753 m)   Wt  209 lb (94.8 kg)   BMI 30.86 kg/m  , BMI Body mass index is 30.86 kg/m. GEN: Well nourished, well developed, in no acute distress  HEENT: normal  Neck: no JVD, carotid bruits, or masses Cardiac: RRR; no murmurs, rubs, or gallops,no edema  Respiratory:  clear to auscultation bilaterally, normal work of breathing GI: soft, nontender, nondistended, + BS MS: no deformity or atrophy  Skin: warm and dry, no rash Neuro:  Strength and sensation are intact Psych: euthymic mood, full affect Ulceration on the bottom of the right foot is superficial now more not as deep as it used to be but has not healed completely.   EKG:  EKG is not ordered today.    Recent Labs: 05/09/2021: Hemoglobin 13.1; Platelets 257 05/16/2021: BUN 18; Creatinine, Ser 1.15; Potassium 4.7; Sodium 142    Lipid Panel No results found for: CHOL, TRIG, HDL, CHOLHDL, VLDL, LDLCALC, LDLDIRECT    Wt Readings from Last 3 Encounters:  08/22/21 209 lb (94.8 kg)  07/21/21 210 lb (95.3 kg)  05/09/21 209 lb (94.8 kg)       No flowsheet data found.    ASSESSMENT AND PLAN:  1.  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  Status post right SFA drug-coated balloon angioplasty and right common iliac artery stent placement with significant improvement in ABI.   Recommend follow-up ABI, aortoiliac duplex and lower extremity arterial duplex to be done in December.  Follow-up with me after. I clarified with her that she is no longer on clopidogrel given that she is on Eliquis anticoagulation.  2.  Paroxysmal atrial fibrillation: This was diagnosed based on outpatient monitor.  She is currently on Eliquis.  3. Hyperlipidemia: Recent lipid profile showed an LDL of 38..  There is reported history of intolerance to statins due to myalgia and currently she is on Lopid and Repatha.  4.  Essential hypertension: Blood  pressure is controlled on current medication.  5.  Coronary artery disease involving native  coronary arteries without angina: Previous stent placement to the right coronary artery.  Continue medical therapy.  6.  Tobacco use: She cut down on tobacco use but has not been able to quit completely.  I discussed the importance of smoking cessation.   Disposition: FU with me in 4 months.  Signed,  [name], MD  08/22/2021 9:23 AM    [facility]

## 2021-08-22 NOTE — Patient Instructions (Signed)
Medication Instructions:  No changes *If you need a refill on your cardiac medications before your next appointment, please call your pharmacy*   Lab Work: None ordered If you have labs (blood work) drawn today and your tests are completely normal, you will receive your results only by: MyChart Message (if you have MyChart) OR A paper copy in the mail If you have any lab test that is abnormal or we need to change your treatment, we will call you to review the results.   Testing/Procedures: Your physician has requested that you have an ankle brachial index (ABI) in December. During this test an ultrasound and blood pressure cuff are used to evaluate the arteries that supply the arms and legs with blood. Allow thirty minutes for this exam. There are no restrictions or special instructions. This will take place at [address] [facility] [address] [facility] [address], [address].   Your physician has requested that you have an Aorta/Iliac Duplex in December. This will be take place at [address] [facility], [address].   No food after 11PM the night before.  Water is OK. (Don't drink liquids if you have been instructed not to for ANOTHER test). Take two Extra-Strength Gas-X capsules at bedtime the night before test.   Take an additional two Extra-Strength Gas-X capsules three (3) hours before the test or first thing in the morning.   Avoid foods that produce bowel gas, for 24 hours prior to exam (see below).   No breakfast, no chewing gum, no smoking or carbonated beverages. Patient may take morning medications with water. Come in for test at least 15 minutes early to register.  Your physician has requested that you have a lower extremity arterial duplex in December. During this test, ultrasound is used to evaluate arterial blood flow in the legs. Allow one hour for this exam. There are no restrictions or special instructions. This will take place at [address] [facility], [address].   Follow-Up: At [facility], you and your  health needs are our priority.  As part of our continuing mission to provide you with exceptional heart care, we have created designated Provider Care Teams.  These Care Teams include your primary Cardiologist (physician) and Advanced Practice Providers (APPs -  Physician Assistants and Nurse Practitioners) who all work together to provide you with the care you need, when you need it.  We recommend signing up for the patient portal called "MyChart".  Sign up information is provided on this After Visit Summary.  MyChart is used to connect with patients for Virtual Visits (Telemedicine).  Patients are able to view lab/test results, encounter notes, upcoming appointments, etc.  Non-urgent messages can be sent to your provider as well.   To learn more about what you can do with MyChart, go to [email or web address].    Your next appointment:   Follow up in December with Dr. Kirke Corin after the dopplers

## 2021-11-01 ENCOUNTER — Ambulatory Visit (HOSPITAL_COMMUNITY)
Admission: RE | Admit: 2021-11-01 | Payer: Medicare Other | Source: Ambulatory Visit | Attending: Cardiovascular Disease | Admitting: Cardiovascular Disease

## 2021-11-01 ENCOUNTER — Encounter (HOSPITAL_COMMUNITY): Payer: Self-pay

## 2021-11-07 ENCOUNTER — Ambulatory Visit: Payer: Medicare Other | Admitting: Cardiovascular Disease

## 2022-03-12 ENCOUNTER — Encounter (HOSPITAL_COMMUNITY): Payer: Self-pay | Admitting: Cardiovascular Disease

## 2022-03-29 ENCOUNTER — Encounter (HOSPITAL_COMMUNITY): Payer: Self-pay | Admitting: Emergency Medicine

## 2022-03-29 ENCOUNTER — Inpatient Hospital Stay (HOSPITAL_COMMUNITY)
Admission: EM | Admit: 2022-03-29 | Discharge: 2022-04-07 | DRG: 475 | Disposition: A | Discharge status: Skilled Nursing Facility | Payer: Medicare Other | Attending: Internal Medicine | Admitting: Internal Medicine

## 2022-03-29 ENCOUNTER — Emergency Department (HOSPITAL_COMMUNITY): Payer: Medicare Other

## 2022-03-29 DIAGNOSIS — Z888 Allergy status to other drugs, medicaments and biological substances status: Secondary | ICD-10-CM

## 2022-03-29 DIAGNOSIS — E669 Obesity, unspecified: Secondary | ICD-10-CM | POA: Diagnosis present

## 2022-03-29 DIAGNOSIS — D631 Anemia in chronic kidney disease: Secondary | ICD-10-CM | POA: Diagnosis present

## 2022-03-29 DIAGNOSIS — N1831 Chronic kidney disease, stage 3a: Secondary | ICD-10-CM | POA: Diagnosis present

## 2022-03-29 DIAGNOSIS — I739 Peripheral vascular disease, unspecified: Secondary | ICD-10-CM | POA: Diagnosis present

## 2022-03-29 DIAGNOSIS — T8753 Necrosis of amputation stump, right lower extremity: Principal | ICD-10-CM | POA: Diagnosis present

## 2022-03-29 DIAGNOSIS — Y835 Amputation of limb(s) as the cause of abnormal reaction of the patient, or of later complication, without mention of misadventure at the time of the procedure: Secondary | ICD-10-CM | POA: Diagnosis present

## 2022-03-29 DIAGNOSIS — Z882 Allergy status to sulfonamides status: Secondary | ICD-10-CM

## 2022-03-29 DIAGNOSIS — E1122 Type 2 diabetes mellitus with diabetic chronic kidney disease: Secondary | ICD-10-CM | POA: Diagnosis present

## 2022-03-29 DIAGNOSIS — L97419 Non-pressure chronic ulcer of right heel and midfoot with unspecified severity: Secondary | ICD-10-CM | POA: Diagnosis present

## 2022-03-29 DIAGNOSIS — F419 Anxiety disorder, unspecified: Secondary | ICD-10-CM | POA: Diagnosis present

## 2022-03-29 DIAGNOSIS — E43 Unspecified severe protein-calorie malnutrition: Secondary | ICD-10-CM

## 2022-03-29 DIAGNOSIS — Z23 Encounter for immunization: Secondary | ICD-10-CM | POA: Diagnosis present

## 2022-03-29 DIAGNOSIS — E119 Type 2 diabetes mellitus without complications: Secondary | ICD-10-CM | POA: Diagnosis not present

## 2022-03-29 DIAGNOSIS — Z7984 Long term (current) use of oral hypoglycemic drugs: Secondary | ICD-10-CM

## 2022-03-29 DIAGNOSIS — K219 Gastro-esophageal reflux disease without esophagitis: Secondary | ICD-10-CM | POA: Diagnosis present

## 2022-03-29 DIAGNOSIS — I4891 Unspecified atrial fibrillation: Secondary | ICD-10-CM | POA: Diagnosis present

## 2022-03-29 DIAGNOSIS — Z6830 Body mass index (BMI) 30.0-30.9, adult: Secondary | ICD-10-CM

## 2022-03-29 DIAGNOSIS — I129 Hypertensive chronic kidney disease with stage 1 through stage 4 chronic kidney disease, or unspecified chronic kidney disease: Secondary | ICD-10-CM | POA: Diagnosis present

## 2022-03-29 DIAGNOSIS — Z880 Allergy status to penicillin: Secondary | ICD-10-CM

## 2022-03-29 DIAGNOSIS — N182 Chronic kidney disease, stage 2 (mild): Secondary | ICD-10-CM | POA: Diagnosis not present

## 2022-03-29 DIAGNOSIS — Y713 Surgical instruments, materials and cardiovascular devices (including sutures) associated with adverse incidents: Secondary | ICD-10-CM | POA: Diagnosis present

## 2022-03-29 DIAGNOSIS — M86671 Other chronic osteomyelitis, right ankle and foot: Secondary | ICD-10-CM | POA: Diagnosis present

## 2022-03-29 DIAGNOSIS — L97519 Non-pressure chronic ulcer of other part of right foot with unspecified severity: Secondary | ICD-10-CM | POA: Diagnosis present

## 2022-03-29 DIAGNOSIS — T380X5A Adverse effect of glucocorticoids and synthetic analogues, initial encounter: Secondary | ICD-10-CM | POA: Diagnosis not present

## 2022-03-29 DIAGNOSIS — Z20822 Contact with and (suspected) exposure to covid-19: Secondary | ICD-10-CM | POA: Diagnosis present

## 2022-03-29 DIAGNOSIS — I10 Essential (primary) hypertension: Secondary | ICD-10-CM | POA: Diagnosis not present

## 2022-03-29 DIAGNOSIS — I96 Gangrene, not elsewhere classified: Principal | ICD-10-CM

## 2022-03-29 DIAGNOSIS — Y838 Other surgical procedures as the cause of abnormal reaction of the patient, or of later complication, without mention of misadventure at the time of the procedure: Secondary | ICD-10-CM | POA: Diagnosis present

## 2022-03-29 DIAGNOSIS — E11628 Type 2 diabetes mellitus with other skin complications: Secondary | ICD-10-CM | POA: Diagnosis present

## 2022-03-29 DIAGNOSIS — L89152 Pressure ulcer of sacral region, stage 2: Secondary | ICD-10-CM | POA: Diagnosis present

## 2022-03-29 DIAGNOSIS — Z955 Presence of coronary angioplasty implant and graft: Secondary | ICD-10-CM

## 2022-03-29 DIAGNOSIS — Z833 Family history of diabetes mellitus: Secondary | ICD-10-CM

## 2022-03-29 DIAGNOSIS — F339 Major depressive disorder, recurrent, unspecified: Secondary | ICD-10-CM | POA: Diagnosis present

## 2022-03-29 DIAGNOSIS — Z818 Family history of other mental and behavioral disorders: Secondary | ICD-10-CM

## 2022-03-29 DIAGNOSIS — I70261 Atherosclerosis of native arteries of extremities with gangrene, right leg: Secondary | ICD-10-CM | POA: Diagnosis present

## 2022-03-29 DIAGNOSIS — Z794 Long term (current) use of insulin: Secondary | ICD-10-CM | POA: Diagnosis not present

## 2022-03-29 DIAGNOSIS — E1140 Type 2 diabetes mellitus with diabetic neuropathy, unspecified: Secondary | ICD-10-CM | POA: Diagnosis present

## 2022-03-29 DIAGNOSIS — E1165 Type 2 diabetes mellitus with hyperglycemia: Secondary | ICD-10-CM | POA: Diagnosis not present

## 2022-03-29 DIAGNOSIS — L089 Local infection of the skin and subcutaneous tissue, unspecified: Secondary | ICD-10-CM | POA: Diagnosis present

## 2022-03-29 DIAGNOSIS — E1169 Type 2 diabetes mellitus with other specified complication: Secondary | ICD-10-CM | POA: Diagnosis present

## 2022-03-29 DIAGNOSIS — T82856A Stenosis of peripheral vascular stent, initial encounter: Secondary | ICD-10-CM | POA: Diagnosis present

## 2022-03-29 DIAGNOSIS — I480 Paroxysmal atrial fibrillation: Secondary | ICD-10-CM | POA: Diagnosis present

## 2022-03-29 DIAGNOSIS — I745 Embolism and thrombosis of iliac artery: Secondary | ICD-10-CM | POA: Diagnosis present

## 2022-03-29 DIAGNOSIS — Z79899 Other long term (current) drug therapy: Secondary | ICD-10-CM

## 2022-03-29 DIAGNOSIS — I70234 Atherosclerosis of native arteries of right leg with ulceration of heel and midfoot: Secondary | ICD-10-CM | POA: Diagnosis not present

## 2022-03-29 DIAGNOSIS — Z881 Allergy status to other antibiotic agents status: Secondary | ICD-10-CM

## 2022-03-29 DIAGNOSIS — E11621 Type 2 diabetes mellitus with foot ulcer: Secondary | ICD-10-CM | POA: Diagnosis present

## 2022-03-29 DIAGNOSIS — E7800 Pure hypercholesterolemia, unspecified: Secondary | ICD-10-CM | POA: Diagnosis present

## 2022-03-29 DIAGNOSIS — Z91041 Radiographic dye allergy status: Secondary | ICD-10-CM

## 2022-03-29 DIAGNOSIS — E1152 Type 2 diabetes mellitus with diabetic peripheral angiopathy with gangrene: Secondary | ICD-10-CM | POA: Diagnosis present

## 2022-03-29 DIAGNOSIS — I2510 Atherosclerotic heart disease of native coronary artery without angina pectoris: Secondary | ICD-10-CM | POA: Diagnosis not present

## 2022-03-29 DIAGNOSIS — F17210 Nicotine dependence, cigarettes, uncomplicated: Secondary | ICD-10-CM | POA: Diagnosis present

## 2022-03-29 DIAGNOSIS — Z95820 Peripheral vascular angioplasty status with implants and grafts: Secondary | ICD-10-CM | POA: Diagnosis not present

## 2022-03-29 DIAGNOSIS — Z7901 Long term (current) use of anticoagulants: Secondary | ICD-10-CM

## 2022-03-29 DIAGNOSIS — T82858A Stenosis of vascular prosthetic devices, implants and grafts, initial encounter: Secondary | ICD-10-CM | POA: Diagnosis not present

## 2022-03-29 DIAGNOSIS — Z8249 Family history of ischemic heart disease and other diseases of the circulatory system: Secondary | ICD-10-CM

## 2022-03-29 DIAGNOSIS — T8781 Dehiscence of amputation stump: Secondary | ICD-10-CM | POA: Diagnosis not present

## 2022-03-29 DIAGNOSIS — Z808 Family history of malignant neoplasm of other organs or systems: Secondary | ICD-10-CM

## 2022-03-29 LAB — CBC WITH DIFFERENTIAL/PLATELET
Abs Immature Granulocytes: 0.12 10*3/uL — ABNORMAL HIGH (ref 0.00–0.07)
Basophils Absolute: 0.1 10*3/uL (ref 0.0–0.1)
Basophils Relative: 0 %
Eosinophils Absolute: 0.2 10*3/uL (ref 0.0–0.5)
Eosinophils Relative: 2 %
HCT: 36.9 % (ref 36.0–46.0)
Hemoglobin: 11.9 g/dL — ABNORMAL LOW (ref 12.0–15.0)
Immature Granulocytes: 1 %
Lymphocytes Relative: 18 %
Lymphs Abs: 2.5 10*3/uL (ref 0.7–4.0)
MCH: 29.1 pg (ref 26.0–34.0)
MCHC: 32.2 g/dL (ref 30.0–36.0)
MCV: 90.2 fL (ref 80.0–100.0)
Monocytes Absolute: 1.1 10*3/uL — ABNORMAL HIGH (ref 0.1–1.0)
Monocytes Relative: 8 %
Neutro Abs: 9.7 10*3/uL — ABNORMAL HIGH (ref 1.7–7.7)
Neutrophils Relative %: 71 %
Platelets: 266 10*3/uL (ref 150–400)
RBC: 4.09 MIL/uL (ref 3.87–5.11)
RDW: 14 % (ref 11.5–15.5)
WBC: 13.6 10*3/uL — ABNORMAL HIGH (ref 4.0–10.5)
nRBC: 0 % (ref 0.0–0.2)

## 2022-03-29 LAB — COMPREHENSIVE METABOLIC PANEL
ALT: 10 U/L (ref 0–44)
AST: 9 U/L — ABNORMAL LOW (ref 15–41)
Albumin: 2.6 g/dL — ABNORMAL LOW (ref 3.5–5.0)
Alkaline Phosphatase: 89 U/L (ref 38–126)
Anion gap: 7 (ref 5–15)
BUN: 17 mg/dL (ref 8–23)
CO2: 20 mmol/L — ABNORMAL LOW (ref 22–32)
Calcium: 8.6 mg/dL — ABNORMAL LOW (ref 8.9–10.3)
Chloride: 110 mmol/L (ref 98–111)
Creatinine, Ser: 1.08 mg/dL — ABNORMAL HIGH (ref 0.44–1.00)
GFR, Estimated: 58 mL/min — ABNORMAL LOW (ref >60)
Glucose, Bld: 296 mg/dL — ABNORMAL HIGH (ref 70–99)
Potassium: 4.2 mmol/L (ref 3.5–5.1)
Sodium: 137 mmol/L (ref 135–145)
Total Bilirubin: 0.5 mg/dL (ref 0.3–1.2)
Total Protein: 6.9 g/dL (ref 6.5–8.1)

## 2022-03-29 LAB — LACTIC ACID, PLASMA: Lactic Acid, Venous: 1.5 mmol/L (ref 0.5–1.9)

## 2022-03-29 LAB — HIV ANTIBODY (ROUTINE TESTING W REFLEX): HIV Screen 4th Generation wRfx: NONREACTIVE

## 2022-03-29 LAB — SEDIMENTATION RATE: Sed Rate: 72 mm/hr — ABNORMAL HIGH (ref 0–22)

## 2022-03-29 LAB — RESP PANEL BY RT-PCR (FLU A&B, COVID) ARPGX2
Influenza A by PCR: NEGATIVE
Influenza B by PCR: NEGATIVE
SARS Coronavirus 2 by RT PCR: NEGATIVE

## 2022-03-29 LAB — PREALBUMIN: Prealbumin: 13.5 mg/dL — ABNORMAL LOW (ref 18–38)

## 2022-03-29 LAB — C-REACTIVE PROTEIN: CRP: 17.7 mg/dL — ABNORMAL HIGH (ref <1.0)

## 2022-03-29 MED ORDER — ACETAMINOPHEN 650 MG RE SUPP: 650.0000 mg | Freq: Four times a day (QID) | RECTAL | Status: DC | PRN

## 2022-03-29 MED ORDER — DULOXETINE HCL 60 MG PO CPEP
60.0000 mg | ORAL_CAPSULE | Freq: Two times a day (BID) | ORAL | Status: DC
Start: 2022-03-29 — End: 2022-04-07
  Administered 2022-03-30 – 2022-04-07 (×17): 60 mg via ORAL
  Filled 2022-03-29 (×17): qty 1

## 2022-03-29 MED ORDER — METRONIDAZOLE 500 MG/100ML IV SOLN
500.0000 mg | Freq: Two times a day (BID) | INTRAVENOUS | Status: AC
  Administered 2022-03-29 – 2022-04-05 (×15): 500 mg via INTRAVENOUS
  Filled 2022-03-29 (×15): qty 100

## 2022-03-29 MED ORDER — ONDANSETRON HCL 4 MG PO TABS: 4.0000 mg | ORAL_TABLET | Freq: Four times a day (QID) | ORAL | Status: DC | PRN

## 2022-03-29 MED ORDER — ONDANSETRON HCL 4 MG/2ML IJ SOLN: 4.0000 mg | Freq: Four times a day (QID) | INTRAMUSCULAR | Status: DC | PRN

## 2022-03-29 MED ORDER — VANCOMYCIN HCL IN DEXTROSE 1-5 GM/200ML-% IV SOLN: 1000.0000 mg | INTRAVENOUS | Status: DC

## 2022-03-29 MED ORDER — PANTOPRAZOLE SODIUM 40 MG PO TBEC
40.0000 mg | DELAYED_RELEASE_TABLET | Freq: Every day | ORAL | Status: DC
  Administered 2022-03-30 – 2022-04-07 (×9): 40 mg via ORAL
  Filled 2022-03-29 (×9): qty 1

## 2022-03-29 MED ORDER — HYDROXYZINE HCL 25 MG PO TABS
75.0000 mg | ORAL_TABLET | Freq: Every day | ORAL | Status: DC
  Administered 2022-03-30 – 2022-04-07 (×9): 75 mg via ORAL
  Filled 2022-03-29 (×9): qty 3

## 2022-03-29 MED ORDER — GABAPENTIN 400 MG PO CAPS
1200.0000 mg | ORAL_CAPSULE | Freq: Two times a day (BID) | ORAL | Status: DC
  Administered 2022-03-30 – 2022-04-07 (×17): 1200 mg via ORAL
  Filled 2022-03-29 (×18): qty 3

## 2022-03-29 MED ORDER — ALBUTEROL SULFATE (2.5 MG/3ML) 0.083% IN NEBU: 3.0000 mL | INHALATION_SOLUTION | Freq: Four times a day (QID) | RESPIRATORY_TRACT | Status: DC | PRN

## 2022-03-29 MED ORDER — METOPROLOL SUCCINATE ER 50 MG PO TB24
50.0000 mg | ORAL_TABLET | Freq: Every day | ORAL | Status: DC
  Administered 2022-03-30 – 2022-04-01 (×3): 50 mg via ORAL
  Filled 2022-03-29: qty 1
  Filled 2022-03-29: qty 2
  Filled 2022-03-29: qty 1

## 2022-03-29 MED ORDER — VANCOMYCIN HCL 2000 MG/400ML IV SOLN
2000.0000 mg | Freq: Once | INTRAVENOUS | Status: AC
  Administered 2022-03-29: 2000 mg via INTRAVENOUS
  Filled 2022-03-29 (×2): qty 400

## 2022-03-29 MED ORDER — FESOTERODINE FUMARATE ER 8 MG PO TB24
8.0000 mg | ORAL_TABLET | Freq: Every day | ORAL | Status: DC
  Administered 2022-03-30 – 2022-04-07 (×8): 8 mg via ORAL
  Filled 2022-03-29 (×10): qty 1

## 2022-03-29 MED ORDER — INSULIN GLARGINE-YFGN 100 UNIT/ML ~~LOC~~ SOLN
5.0000 [IU] | Freq: Every day | SUBCUTANEOUS | Status: DC
  Administered 2022-03-30 – 2022-04-01 (×3): 5 [IU] via SUBCUTANEOUS
  Filled 2022-03-29 (×4): qty 0.05

## 2022-03-29 MED ORDER — SODIUM CHLORIDE 0.9 % IV SOLN: INTRAVENOUS | Status: AC

## 2022-03-29 MED ORDER — ACETAMINOPHEN 325 MG PO TABS
650.0000 mg | ORAL_TABLET | Freq: Four times a day (QID) | ORAL | Status: DC | PRN
  Administered 2022-04-01: 650 mg via ORAL
  Filled 2022-03-29 (×3): qty 2

## 2022-03-29 MED ORDER — INSULIN ASPART 100 UNIT/ML IJ SOLN
0.0000 [IU] | INTRAMUSCULAR | Status: DC
  Administered 2022-03-30: 3 [IU] via SUBCUTANEOUS
  Administered 2022-03-30: 2 [IU] via SUBCUTANEOUS
  Administered 2022-03-30: 3 [IU] via SUBCUTANEOUS
  Administered 2022-03-30: 2 [IU] via SUBCUTANEOUS
  Administered 2022-03-30: 5 [IU] via SUBCUTANEOUS
  Administered 2022-03-31 (×2): 2 [IU] via SUBCUTANEOUS
  Administered 2022-03-31: 1 [IU] via SUBCUTANEOUS
  Administered 2022-03-31: 5 [IU] via SUBCUTANEOUS
  Administered 2022-03-31 – 2022-04-01 (×2): 2 [IU] via SUBCUTANEOUS
  Administered 2022-04-01: 5 [IU] via SUBCUTANEOUS
  Administered 2022-04-01 (×2): 2 [IU] via SUBCUTANEOUS
  Administered 2022-04-01: 3 [IU] via SUBCUTANEOUS
  Administered 2022-04-01: 2 [IU] via SUBCUTANEOUS
  Administered 2022-04-02: 5 [IU] via SUBCUTANEOUS
  Administered 2022-04-02: 7 [IU] via SUBCUTANEOUS

## 2022-03-29 MED ORDER — FENTANYL CITRATE PF 50 MCG/ML IJ SOSY
25.0000 ug | PREFILLED_SYRINGE | INTRAMUSCULAR | Status: DC | PRN
  Administered 2022-03-31 – 2022-04-02 (×3): 50 ug via INTRAVENOUS
  Filled 2022-03-29 (×3): qty 1

## 2022-03-29 MED ORDER — SODIUM CHLORIDE 0.9 % IV SOLN
2.0000 g | Freq: Three times a day (TID) | INTRAVENOUS | Status: DC
  Administered 2022-03-29 – 2022-04-05 (×22): 2 g via INTRAVENOUS
  Filled 2022-03-29 (×23): qty 10

## 2022-03-29 MED ORDER — MIRABEGRON ER 25 MG PO TB24
25.0000 mg | ORAL_TABLET | Freq: Every day | ORAL | Status: DC
  Administered 2022-03-30 – 2022-04-07 (×8): 25 mg via ORAL
  Filled 2022-03-29 (×10): qty 1

## 2022-03-29 NOTE — ED Provider Notes (Signed)
?[facility] EMERGENCY DEPARTMENT ?Provider Note ? ? ?CSN: [id number] ?Arrival date & time: 03/29/22  1329 ? ?  ? ?History ? ?Chief Complaint  ?Patient presents with  ? Wound Check  ? ? ?Kerry Randall is a 61 y.o. female with a past medical history of diabetes who presents to the emergency department complaining of wound check to right foot onset 2 weeks.  She noted increase foul odor and black eschar.  Was evaluated by her podiatrist on 03/26/2022 and was informed that she would likely need a BKA.  She was informed to go to the emergency department for her symptoms.  Patient has not tried medication for symptoms.  She notes she has not taking her prescription medications in the past 2 days to prepare for surgery because she did not know which meds she could not take.  Denies fever, chills, chest pain, shortness of breath, nausea, vomiting. ? ? ?Per pt chart review: Pt was seen by her podiatrist, Dr. Romualdo Bolk on 03/26/22 and at that time it was recommended for the patient to proceed to the immediately ED for a BKA.  ? ? ?The history is provided by the patient. No language interpreter was used.  ? ?  ? ?Home Medications ?Prior to Admission medications   ?Medication Sig Start Date End Date Taking? Authorizing Provider  ?albuterol (VENTOLIN HFA) 108 (90 Base) MCG/ACT inhaler Inhale 1-2 puffs into the lungs every 6 (six) hours as needed for wheezing or shortness of breath.    [provider]  ?apixaban (ELIQUIS) 5 MG TABS tablet Take 5 mg by mouth 2 (two) times daily. 03/06/21   [provider]  ?ascorbic acid (VITAMIN C) 500 MG tablet Take 500 mg by mouth in the morning. 06/27/17   [provider]  ?B Complex-C (B-COMPLEX WITH VITAMIN C) tablet Take 1 tablet by mouth 3 (three) times a week. (on cleaning days)    [provider]  ?Biotin 5000 MCG TABS Take 5,000 mcg by mouth in the morning.    [provider]  ?Brimonidine Tartrate (LUMIFY) 0.025 % SOLN Place 1 drop into  both eyes daily as needed (tired eyes).    [provider]  ?dapagliflozin propanediol (FARXIGA) 10 MG TABS tablet Take 10 mg by mouth in the morning.    [provider]  ?Dulaglutide (TRULICITY) 3 MG/0.5ML SOPN Inject 3 mg into the skin every Tuesday.    [provider]  ?DULoxetine (CYMBALTA) 60 MG capsule Take 60 mg by mouth 2 (two) times daily. 08/31/20   [provider]  ?ferrous sulfate 325 (65 FE) MG tablet Take 975 mg by mouth in the morning.    [provider]  ?fesoterodine (TOVIAZ) 8 MG TB24 tablet Take 8 mg by mouth in the morning. 03/11/15   [provider]  ?gabapentin (NEURONTIN) 600 MG tablet Take 1,200 mg by mouth 2 (two) times daily. 03/17/15   [provider]  ?gemfibrozil (LOPID) 600 MG tablet Take 600 mg by mouth 2 (two) times daily. 10/14/20   [provider]  ?Glucosamine-Chondroitin (COSAMIN DS PO) Take 2 tablets by mouth in the morning.    [provider]  ?hydrOXYzine (ATARAX/VISTARIL) 25 MG tablet Take 75 mg by mouth in the morning. 02/20/21   [provider]  ?insulin glargine (LANTUS) 100 UNIT/ML Solostar Pen Inject 17 Units into the skin daily. 03/04/20   [provider]  ?lipase/protease/amylase (CREON) 36000 UNITS CPEP capsule Take 36,000-72,000 Units by mouth See admin instructions.  [provider]  ?lisinopril (ZESTRIL) 30 MG tablet Take 30 mg by mouth in the morning. 08/02/20   [provider]  ?magnesium oxide (MAG-OX) 400 MG tablet Take 400 mg by mouth in the morning.    [provider]  ?metFORMIN (GLUCOPHAGE-XR) 500 MG 24 hr tablet Take 1,000 mg by mouth 2 (two) times daily. 11/13/19   [provider]  ?metoprolol succinate (TOPROL-XL) 50 MG 24 hr tablet Take 50 mg by mouth in the morning. 10/14/20   [provider]  ?mirabegron ER (MYRBETRIQ) 25 MG TB24 tablet Take 25 mg by mouth in the morning. 09/20/20   [provider]   ?nicotine (NICODERM CQ - DOSED IN MG/24 HOURS) 14 mg/24hr patch Place 1 patch (14 mg total) onto the skin daily. 04/28/21   Barrett, Joline Salt, PA-C  ?nitroGLYCERIN (NITROSTAT) 0.4 MG SL tablet Place 0.4 mg under the tongue every 5 (five) minutes x 3 doses as needed for chest pain. 06/20/20   [provider]  ?Omega-3 Fatty Acids (FISH OIL) 1000 MG CAPS Take 1,000 mg by mouth every other day. In the morning    [provider]  ?pantoprazole (PROTONIX) 40 MG tablet Take 40 mg by mouth in the morning. 02/15/21   [provider]  ?REPATHA SURECLICK 140 MG/ML SOAJ Inject 140 mg into the skin every 14 (fourteen) days. 03/27/21   [provider]  ?traMADol (ULTRAM) 50 MG tablet Take 1 tablet (50 mg total) by mouth every 6 (six) hours as needed. 07/21/21   Wynetta Fines, MD  ?triamcinolone ointment (KENALOG) 0.5 % Apply 1 application topically 2 (two) times daily as needed (sores/skin irritation).    [provider]  ?VITAMIN A PO Take 3,000 mcg by mouth every other day. In the morning    [provider]  ?Vitamin E 450 MG (1000 UT) CAPS Take 1,000 Units by mouth in the morning.    [provider]  ?   ? ?Allergies    ?Erythromycin, Iodinated contrast media, Levofloxacin, Oxycodone-acetaminophen, Penicillins, Rosuvastatin, and Sulfa antibiotics   ? ?Review of Systems   ?Review of Systems  ?Constitutional:  Negative for chills and fever.  ?Respiratory:  Negative for shortness of breath.   ?Cardiovascular:  Negative for chest pain.  ?Gastrointestinal:  Negative for nausea and vomiting.  ?Musculoskeletal:  Positive for arthralgias and joint swelling.  ?Skin:  Positive for wound. Negative for color change.  ?All other systems reviewed and are negative. ? ?Physical Exam ?Updated Vital Signs ?BP 132/65 (BP Location: Left Arm)   Pulse 91   Temp 99.3 ?F (37.4 ?C) (Oral)   Resp 14   SpO2 96%  ?Physical Exam ?Vitals and nursing note reviewed.  ?Constitutional:   ?    General: She is not in acute distress. ?   Appearance: She is not diaphoretic.  ?HENT:  ?   Head: Normocephalic and atraumatic.  ?   Mouth/Throat:  ?   Pharynx: No oropharyngeal exudate.  ?Eyes:  ?   General: No scleral icterus. ?   Conjunctiva/sclera: Conjunctivae normal.  ?Cardiovascular:  ?   Rate and Rhythm: Normal rate and regular rhythm.  ?   Pulses: Normal pulses.  ?   Heart sounds: Normal heart sounds.  ?Pulmonary:  ?   Effort: Pulmonary effort is normal. No respiratory distress.  ?   Breath sounds: Normal breath sounds. No wheezing.  ?Abdominal:  ?   General: Bowel sounds are normal.  ?   Palpations: Abdomen is  soft. There is no mass.  ?   Tenderness: There is no abdominal tenderness. There is no guarding or rebound.  ?Musculoskeletal:     ?   General: Normal range of motion.  ?   Cervical back: Normal range of motion and neck supple.  ?   Comments: Faint DP pulse detected via doppler. Unable to detect PT pulse.  No surrounding erythema.  Able to range right ankle without difficulty.  Extremity warm.  Foul-smelling wound with mild drainage noted to right foot.  See pictures below  ?Skin: ?   General: Skin is warm and dry.  ?Neurological:  ?   Mental Status: She is alert.  ?Psychiatric:     ?   Behavior: Behavior normal.  ? ? ? ? ? ? ? ?ED Results / Procedures / Treatments   ?Labs ?(all labs ordered are listed, but only abnormal results are displayed) ?Labs Reviewed  ?COMPREHENSIVE METABOLIC PANEL - Abnormal; Notable for the following components:  ?    Result Value  ? CO2 20 (*)   ? Glucose, Bld 296 (*)   ? Creatinine, Ser 1.08 (*)   ? Calcium 8.6 (*)   ? Albumin 2.6 (*)   ? AST 9 (*)   ? GFR, Estimated 58 (*)   ? All other components within normal limits  ?CBC WITH DIFFERENTIAL/PLATELET - Abnormal; Notable for the following components:  ? WBC 13.6 (*)   ? Hemoglobin 11.9 (*)   ? Neutro Abs 9.7 (*)   ? Monocytes Absolute 1.1 (*)   ? Abs Immature Granulocytes 0.12 (*)   ? All other components within normal  limits  ?CULTURE, BLOOD (ROUTINE X 2)  ?CULTURE, BLOOD (ROUTINE X 2)  ?RESP PANEL BY RT-PCR (FLU A&B, COVID) ARPGX2  ?LACTIC ACID, PLASMA  ?CBG MONITORING, ED  ? ? ?EKG ?None ? ?Radiology ?DG Foot Complete Right ? ?Res

## 2022-03-29 NOTE — Consult Note (Signed)
?Hospital Consult ? ? ? 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 ?History of Present Illness: This is a 61 y.o. 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  Patient is followed by podiatry at [facility] who recommended a BKA and the patient was sent to [facility] at her request.  She states she is understanding that her TMA is not healing and that she needs a more proximal amputation.  She is amendable to an AKA if this has the best chance of healing.  She has had lower extremity work done at [facility].  She has also had work done here at [facility] by Dr. Kirke Corin with cardiology including balloon angioplasty of the distal right SFA and also stent placement in the right common iliac artery on 04/26/21.  She states wound problems with her right lower extremity have been ongoing for 8-10 years. ? ?Past Medical History:  ?Diagnosis Date  ? Atrial fibrillation (HCC)   ? Colon polyps   ? Diabetes mellitus without complication (HCC)   ? GERD (gastroesophageal reflux disease)   ? Heart disease, hyperkinetic   ? Hypercholesteremia   ? Hypertension   ? Kidney disease   ? Leg pain   ? Major depressive disorder, recurrent (HCC)   ? Palpitations   ? Vitamin D deficiency   ? ? ?Past Surgical History:  ?Procedure Laterality Date  ? ABDOMINAL AORTOGRAM W/LOWER EXTREMITY N/A 04/26/2021  ? Procedure: ABDOMINAL AORTOGRAM W/LOWER EXTREMITY;  Surgeon: Iran Ouch, MD;  Location: [facility] INVASIVE CV LAB;  Service: Cardiovascular;  Laterality: N/A;  ? AMPUTATION TOE Bilateral   ? small toe on left foot; all toes on right foot  ? PERIPHERAL VASCULAR BALLOON ANGIOPLASTY Right 04/26/2021  ? Procedure: PERIPHERAL VASCULAR BALLOON ANGIOPLASTY;  Surgeon: Iran Ouch, MD;  Location: [facility] INVASIVE CV LAB;  Service: Cardiovascular;  Laterality: Right;  superficial femoral  ?  PERIPHERAL VASCULAR INTERVENTION Right 04/26/2021  ? Procedure: PERIPHERAL VASCULAR INTERVENTION;  Surgeon: Iran Ouch, MD;  Location: [facility] INVASIVE CV LAB;  Service: Cardiovascular;  Laterality: Right;  Iliac stent  ? TOTAL VAGINAL HYSTERECTOMY  12/1998  ? ? ?Allergies  ?Allergen Reactions  ? Erythromycin Hives  ? Iodinated Contrast Media Swelling  ? Levofloxacin Hives and Swelling  ? Oxycodone-Acetaminophen Anaphylaxis  ?  "Percocet" ?"Percocet" ?  ? Penicillins Anaphylaxis  ?  Hypotension ?  ? Rosuvastatin Rash, Hives and Other (See Comments)  ?  Muscle Cramps and weakness ?  ? Sulfa Antibiotics Hives  ? ? ?Prior to Admission medications   ?Medication Sig Start Date End Date Taking? Authorizing Provider  ?albuterol (VENTOLIN HFA) 108 (90 Base) MCG/ACT inhaler Inhale 1-2 puffs into the lungs every 6 (six) hours as needed for wheezing or shortness of breath.    [provider]  ?apixaban (ELIQUIS) 5 MG TABS tablet Take 5 mg by mouth 2 (two) times daily. 03/06/21   [provider]  ?ascorbic acid (VITAMIN C) 500 MG tablet Take 500 mg by mouth in the morning. 06/27/17   [provider]  ?B Complex-C (B-COMPLEX WITH VITAMIN C) tablet Take 1 tablet by mouth 3 (three) times a week. (on cleaning days)    [provider]  ?Biotin 5000 MCG TABS Take 5,000 mcg by mouth in the morning.    [provider]  ?Brimonidine Tartrate (LUMIFY) 0.025 %  SOLN Place 1 drop into both eyes daily as needed (tired eyes).    [provider]  ?dapagliflozin propanediol (FARXIGA) 10 MG TABS tablet Take 10 mg by mouth in the morning.    [provider]  ?Dulaglutide (TRULICITY) 3 MG/0.5ML SOPN Inject 3 mg into the skin every Tuesday.    [provider]  ?DULoxetine (CYMBALTA) 60 MG capsule Take 60 mg by mouth 2 (two) times daily. 08/31/20   [provider]  ?ferrous sulfate 325 (65 FE) MG tablet Take 975 mg by mouth in the morning.    [provider]   ?fesoterodine (TOVIAZ) 8 MG TB24 tablet Take 8 mg by mouth in the morning. 03/11/15   [provider]  ?gabapentin (NEURONTIN) 600 MG tablet Take 1,200 mg by mouth 2 (two) times daily. 03/17/15   [provider]  ?gemfibrozil (LOPID) 600 MG tablet Take 600 mg by mouth 2 (two) times daily. 10/14/20   [provider]  ?Glucosamine-Chondroitin (COSAMIN DS PO) Take 2 tablets by mouth in the morning.    [provider]  ?hydrOXYzine (ATARAX/VISTARIL) 25 MG tablet Take 75 mg by mouth in the morning. 02/20/21   [provider]  ?insulin glargine (LANTUS) 100 UNIT/ML Solostar Pen Inject 17 Units into the skin daily. 03/04/20   [provider]  ?lipase/protease/amylase (CREON) 36000 UNITS CPEP capsule Take 36,000-72,000 Units by mouth See admin instructions.    [provider]  ?lisinopril (ZESTRIL) 30 MG tablet Take 30 mg by mouth in the morning. 08/02/20   [provider]  ?magnesium oxide (MAG-OX) 400 MG tablet Take 400 mg by mouth in the morning.    [provider]  ?metFORMIN (GLUCOPHAGE-XR) 500 MG 24 hr tablet Take 1,000 mg by mouth 2 (two) times daily. 11/13/19   [provider]  ?metoprolol succinate (TOPROL-XL) 50 MG 24 hr tablet Take 50 mg by mouth in the morning. 10/14/20   [provider]  ?mirabegron ER (MYRBETRIQ) 25 MG TB24 tablet Take 25 mg by mouth in the morning. 09/20/20   [provider]  ?nicotine (NICODERM CQ - DOSED IN MG/24 HOURS) 14 mg/24hr patch Place 1 patch (14 mg total) onto the skin daily. 04/28/21   Barrett, Joline Salt, PA-C  ?nitroGLYCERIN (NITROSTAT) 0.4 MG SL tablet Place 0.4 mg under the tongue every 5 (five) minutes x 3 doses as needed for chest pain. 06/20/20   [provider]  ?Omega-3 Fatty Acids (FISH OIL) 1000 MG CAPS Take 1,000 mg by mouth every other day. In the morning    [provider]  ?pantoprazole (PROTONIX) 40 MG tablet Take 40 mg by mouth in the morning. 02/15/21    [provider]  ?REPATHA SURECLICK 140 MG/ML SOAJ Inject 140 mg into the skin every 14 (fourteen) days. 03/27/21   [provider]  ?traMADol (ULTRAM) 50 MG tablet Take 1 tablet (50 mg total) by mouth every 6 (six) hours as needed. 07/21/21   Wynetta Fines, MD  ?triamcinolone ointment (KENALOG) 0.5 % Apply 1 application topically 2 (two) times daily as needed (sores/skin irritation).    [provider]  ?VITAMIN A PO Take 3,000 mcg by mouth every other day. In the morning    [provider]  ?Vitamin E 450 MG (1000 UT) CAPS Take 1,000 Units by mouth in the morning.    [provider]  ? ? ?Social History  ? ?Socioeconomic History  ? Marital status: Single  ?  Spouse name: Not  on file  ? Number of children: Not on file  ? Years of education: Not on file  ? Highest education level: Not on file  ?Occupational History  ? Not on file  ?Tobacco Use  ? Smoking status: Smoker, Current Status Unknown  ? Smokeless tobacco: Never  ?Substance and Sexual Activity  ? Alcohol use: Not on file  ? Drug use: Not on file  ? Sexual activity: Not on file  ?Other Topics Concern  ? Not on file  ?Social History Narrative  ? Not on file  ? ?Social Determinants of Health  ? ?Financial Resource Strain: Not on file  ?Food Insecurity: Not on file  ?Transportation Needs: Not on file  ?Physical Activity: Not on file  ?Stress: Not on file  ?Social Connections: Not on file  ?Intimate Partner Violence: Not on file  ? ? ? ?Family History  ?Problem Relation Age of Onset  ? Hypertension Mother   ? Diabetes type II Mother   ? Heart disease Mother   ? Throat cancer Father   ? Hypertension Sister   ? Depression Sister   ? Brain cancer Sister   ? Hypertension Maternal Aunt   ? Hyperlipidemia Maternal Aunt   ? Hypertension Maternal Uncle   ? Hyperlipidemia Maternal Uncle   ? Hypertension Paternal Aunt   ? Hyperlipidemia Paternal Aunt   ? Heart disease Paternal Aunt   ? Hypertension Paternal Uncle   ?  Hyperlipidemia Paternal Uncle   ? Diabetes type II Daughter   ? Depression Daughter   ? Anxiety disorder Daughter   ? Lupus Paternal Grandmother   ? ? ?ROS:  Positive    Negative    All sytems reviewed and are neg

## 2022-03-29 NOTE — ED Notes (Signed)
Patient transported to MRI 

## 2022-03-29 NOTE — ED Provider Notes (Signed)
I provided a substantive portion of the care of this patient.  I personally performed the entirety of the exam for this encounter. ? ?   ?Patient is referred to the emergency department by podiatry for significantly advancing diabetic foot wound with pre-existing partial amputation. ? 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  See attached images. ? ?Agree with plan of management. ?  ?Charlesetta Shanks, MD ?04/02/22 1215 ? ?

## 2022-03-29 NOTE — ED Notes (Signed)
Pt is worried about possible having reaction to antibiotics. Antibiotics will be given one at a time just in case of a reaction. ?

## 2022-03-29 NOTE — Consult Note (Addendum)
Brief orthopedic consult note: ?Full note to follow ? ?61 year old female presents to the ED with draining foot wound with overlying eschar and the history of transmetatarsal amputation previously.  Patient is diabetic.  A1c in the chart from 2 years ago is 8.4. ? ?Normal lactic acid.  Mildly elevated white count.  Patient is not in extremis per ED physician.  Blood cultures ordered.  Antibiotics have been started. ? ?Based on images in the chart patient is likely a candidate for below the knee amputation.  ED will continue with diabetic ulcer work-up including pulse exam and ABIs as needed.  MRI scan has been ordered from the ER but I am not sure this is necessary given the clinical photos.  There is inadequate soft tissue availability for revision transmetatarsal amputation. ? ?We will await final vascular work-up and then make a plan.  Keep patient n.p.o. past midnight. ? ? ?

## 2022-03-29 NOTE — ED Provider Triage Note (Signed)
Emergency Medicine Provider Triage Evaluation Note ? ?Kerry Randall , a 61 y.o. female  was evaluated in triage.  Pt complains of foot wound to the right foot.  She is status post transmetatarsal amputation.  She has been dealing with a foot wound for 10 years and has been seeing a podiatrist.  Over the last 2 weeks she developed foul smell, black eschar and was told by her podiatrist that she likely needs a below-knee amputation was sent to the ER.  She did run a fever last night.  She has no complaints at this time.. ? ?Review of Systems  ?Positive: Foot wound ?Negative: fever ? ?Physical Exam  ?BP 132/65 (BP Location: Left Arm)   Pulse 91   Temp 99.3 ?F (37.4 ?C) (Oral)   Resp 14   SpO2 96%  ?Gen:   Awake, no distress   ?Resp:  Normal effort  ?MSK:   Moves extremities without difficulty  ?Other:  Foul smelling wound to the R foot, + dry and wet gangrene ? ?Medical Decision Making  ?Medically screening exam initiated at 2:10 PM.  Appropriate orders placed.  Kerry Randall was informed that the remainder of the evaluation will be completed by another provider, this initial triage assessment does not replace that evaluation, and the importance of remaining in the ED until their evaluation is complete. ? ?Work up inititated ? ? ? ? ? ?  ?Arthor Captain, PA-C ?03/29/22 1418 ? ?

## 2022-03-29 NOTE — ED Triage Notes (Signed)
Patient sent to [facility] from podiatrist for evaluation of wound on bottom of right foot, patient states podiatrist states anticipated need to amputate up to knee. Patient is alert, oriented, and in no apparent distress at this time. ?

## 2022-03-29 NOTE — ED Notes (Signed)
Purewick has been placed.  

## 2022-03-29 NOTE — H&P (Signed)
?History and Physical  ? ? ?Kerry Randall GGE:[id number] DOB: August 09, 1961 DOA: 03/29/2022 ? ?PCP: [facility]  ? ?Patient coming from: Home  ? ?Chief Complaint: Right foot ulcer with discoloration and drainage  ? ?HPI: Kerry Randall is a pleasant 61 y.o. female with medical history significant for insulin-dependent diabetes mellitus, peripheral neuropathy, CKD 3A, depression, anxiety, CAD with stent to RCA in 2016, PAD with balloon angioplasty of the distal SFA and right common iliac artery stent in June 2022, and right foot ulcer who presents at the direction of her podiatrist for evaluation of worsening right foot ulcer.  Patient has history of right transmetatarsal amputation by her podiatrist and has had an ulcer involving the stump and plantar right midfoot that has been enlarging, draining intermittently, and has recently become black around the distal edge.  She had some chills recently.  She was evaluated by her podiatrist for this on 03/26/2022 and advised to seek further evaluation in the hospital. ? ?She reports falling off of her couch while sleeping last night and developing swelling around the left eye.  She denies any change in her vision, denies pain with eye movement, and reports that the swelling has been improving. ? ?Patient states that she quit smoking today. ? ?ED Course: Upon arrival to the ED, patient is found to be afebrile and saturating well on room air with normal heart rate and stable blood pressure.  Chemistry panel notable for glucose 296 and creatinine 1.08.  CBC features a leukocytosis to 13,600.  Lactic acid is normal.  MRI of the foot was obtained and preliminary read is suspicious for osteomyelitis involving the metatarsal stumps but negative for abscess.  Orthopedic and vascular surgery were consulted by the ED PA, blood culture was collected, and antibiotics started. ? ?Review of Systems:  ?All other systems reviewed and apart from HPI, are negative. ? ?Past Medical  History:  ?Diagnosis Date  ? Atrial fibrillation (HCC)   ? Colon polyps   ? Diabetes mellitus without complication (HCC)   ? GERD (gastroesophageal reflux disease)   ? Heart disease, hyperkinetic   ? Hypercholesteremia   ? Hypertension   ? Kidney disease   ? Leg pain   ? Major depressive disorder, recurrent (HCC)   ? Palpitations   ? Vitamin D deficiency   ? ? ?Past Surgical History:  ?Procedure Laterality Date  ? ABDOMINAL AORTOGRAM W/LOWER EXTREMITY N/A 04/26/2021  ? Procedure: ABDOMINAL AORTOGRAM W/LOWER EXTREMITY;  Surgeon: Iran Ouch, MD;  Location: [facility];  Service: Cardiovascular;  Laterality: N/A;  ? AMPUTATION TOE Bilateral   ? small toe on left foot; all toes on right foot  ? PERIPHERAL VASCULAR BALLOON ANGIOPLASTY Right 04/26/2021  ? Procedure: PERIPHERAL VASCULAR BALLOON ANGIOPLASTY;  Surgeon: Iran Ouch, MD;  Location: [facility];  Service: Cardiovascular;  Laterality: Right;  superficial femoral  ? PERIPHERAL VASCULAR INTERVENTION Right 04/26/2021  ? Procedure: PERIPHERAL VASCULAR INTERVENTION;  Surgeon: Iran Ouch, MD;  Location: [facility];  Service: Cardiovascular;  Laterality: Right;  Iliac stent  ? TOTAL VAGINAL HYSTERECTOMY  12/1998  ? ? ?Social History:  ? reports that she has been smoking. She has never used smokeless tobacco. No history on file for alcohol use and drug use. ? ?Allergies  ?Allergen Reactions  ? Erythromycin Hives  ? Iodinated Contrast Media Swelling  ? Levofloxacin Hives and Swelling  ? Oxycodone-Acetaminophen Anaphylaxis  ?  "Percocet" ?"Percocet" ?  ? Penicillins Anaphylaxis  ?  Hypotension ?  ? Rosuvastatin Rash, Hives and Other (See Comments)  ?  Muscle Cramps and weakness ?  ? Sulfa Antibiotics Hives  ? ? ?Family History  ?Problem Relation Age of Onset  ? Hypertension Mother   ? Diabetes type II Mother   ? Heart disease Mother   ? Throat cancer Father   ? Hypertension Sister   ? Depression Sister   ? Brain cancer Sister   ?  Hypertension Maternal Aunt   ? Hyperlipidemia Maternal Aunt   ? Hypertension Maternal Uncle   ? Hyperlipidemia Maternal Uncle   ? Hypertension Paternal Aunt   ? Hyperlipidemia Paternal Aunt   ? Heart disease Paternal Aunt   ? Hypertension Paternal Uncle   ? Hyperlipidemia Paternal Uncle   ? Diabetes type II Daughter   ? Depression Daughter   ? Anxiety disorder Daughter   ? Lupus Paternal Grandmother   ? ? ? ?Prior to Admission medications   ?Medication Sig Start Date End Date Taking? Authorizing Provider  ?albuterol (VENTOLIN HFA) 108 (90 Base) MCG/ACT inhaler Inhale 1-2 puffs into the lungs every 6 (six) hours as needed for wheezing or shortness of breath.   Yes [provider]  ?apixaban (ELIQUIS) 5 MG TABS tablet Take 5 mg by mouth 2 (two) times daily. 03/06/21  Yes [provider]  ?ascorbic acid (VITAMIN C) 500 MG tablet Take 500 mg by mouth in the morning. 06/27/17  Yes [provider]  ?B Complex-C (B-COMPLEX WITH VITAMIN C) tablet Take 1 tablet by mouth 3 (three) times a week. (on cleaning days)   Yes [provider]  ?Biotin 5000 MCG TABS Take 5,000 mcg by mouth in the morning.   Yes [provider]  ?dapagliflozin propanediol (FARXIGA) 10 MG TABS tablet Take 10 mg by mouth in the morning.   Yes [provider]  ?DULoxetine (CYMBALTA) 60 MG capsule Take 60 mg by mouth 2 (two) times daily. 08/31/20  Yes [provider]  ?ferrous sulfate 325 (65 FE) MG tablet Take 325 mg by mouth in the morning.   Yes [provider]  ?fesoterodine (TOVIAZ) 8 MG TB24 tablet Take 8 mg by mouth in the morning. 03/11/15  Yes [provider]  ?gabapentin (NEURONTIN) 600 MG tablet Take 1,200 mg by mouth 2 (two) times daily. 03/17/15  Yes [provider]  ?gemfibrozil (LOPID) 600 MG tablet Take 600 mg by mouth 2 (two) times daily. 10/14/20  Yes [provider]  ?Glucosamine-Chondroitin (COSAMIN DS PO) Take 2 tablets by mouth in the morning.    Yes [provider]  ?hydrOXYzine (ATARAX/VISTARIL) 25 MG tablet Take 75 mg by mouth in the morning. 02/20/21  Yes [provider]  ?insulin glargine (LANTUS) 100 UNIT/ML Solostar Pen Inject 17 Units into the skin daily. 03/04/20  Yes [provider]  ?lisinopril (ZESTRIL) 30 MG tablet Take 30 mg by mouth in the morning. 08/02/20  Yes [provider]  ?magnesium oxide (MAG-OX) 400 MG tablet Take 400 mg by mouth in the morning.   Yes [provider]  ?metoprolol succinate (TOPROL-XL) 50 MG 24 hr tablet Take 50 mg by mouth in the morning. 10/14/20  Yes [provider]  ?mirabegron ER (MYRBETRIQ) 25 MG TB24 tablet Take 25 mg by mouth in the morning. 09/20/20  Yes [provider]  ?nicotine (NICODERM CQ - DOSED IN MG/24 HOURS) 14 mg/24hr patch Place 1 patch (14 mg total) onto the skin daily. 04/28/21  Yes Barrett, Joline Salthonda G,  PA-C  ?Omega-3 Fatty Acids (FISH OIL) 1000 MG CAPS Take 1,000 mg by mouth every other day. In the morning   Yes [provider]  ?pantoprazole (PROTONIX) 40 MG tablet Take 40 mg by mouth in the morning. 02/15/21  Yes [provider]  ?REPATHA SURECLICK 140 MG/ML SOAJ Inject 140 mg into the skin every 14 (fourteen) days. 03/27/21  Yes [provider]  ?triamcinolone ointment (KENALOG) 0.5 % Apply 1 application topically 2 (two) times daily as needed (sores/skin irritation).   Yes [provider]  ?VITAMIN A PO Take 3,000 mcg by mouth every other day. In the morning   Yes [provider]  ?Vitamin E 450 MG (1000 UT) CAPS Take 1,000 Units by mouth in the morning.   Yes [provider]  ?Brimonidine Tartrate (LUMIFY) 0.025 % SOLN Place 1 drop into both eyes daily as needed (tired eyes). ?Patient not taking: Reported on 03/29/2022    [provider]  ?Dulaglutide (TRULICITY) 3 MG/0.5ML SOPN Inject 3 mg into the skin every Tuesday.    [provider]  ?lipase/protease/amylase (CREON)  36000 UNITS CPEP capsule Take 36,000-72,000 Units by mouth See admin instructions. ?Patient not taking: Reported on 03/29/2022    [provider]  ?metFORMIN (GLUCOPHAGE-XR) 500 MG 24 hr tablet Take 1,0

## 2022-03-29 NOTE — Progress Notes (Signed)
Pharmacy Antibiotic Note ? ?Kerry Randall is a 61 y.o. female admitted on 03/29/2022 with  wound infection .  Pharmacy has been consulted for Vancomycin and Aztreonam dosing. ? ?Plan: ?Vancomycin 2000 mg IV x 1, followed by 1000 mg IV q24h (eAUC 415.7, Scr 1.08, goal AUC 400-550) ?Aztreonam 2 g IV q8h  ?Flagyl 500 mg IV q12h ?Follow-up clinical status, renal function ?Follow-up cultures, LOT, narrow as able  ?Obtain Vancomycin levels as appropriate ? ?Temp (24hrs), Avg:99.3 ?F (37.4 ?C), Min:99.3 ?F (37.4 ?C), Max:99.3 ?F (37.4 ?C) ? ?Recent Labs  ?Lab 03/29/22 ?1427  ?WBC 13.6*  ?CREATININE 1.08*  ?LATICACIDVEN 1.5  ?  ?CrCl cannot be calculated (Unknown ideal weight.).   ? ?Allergies  ?Allergen Reactions  ? Erythromycin Hives  ? Iodinated Contrast Media Swelling  ? Levofloxacin Hives and Swelling  ? Oxycodone-Acetaminophen Anaphylaxis  ?  "Percocet" ?"Percocet" ?  ? Penicillins Anaphylaxis  ?  Hypotension ?  ? Rosuvastatin Rash, Hives and Other (See Comments)  ?  Muscle Cramps and weakness ?  ? Sulfa Antibiotics Hives  ? ? ?Antimicrobials this admission: ?Vancomycin 5/4 >>  ?Aztreonam 5/4 >>  ?Flagyl 5/4 >>  ? ?Microbiology results: ?5/4 BCx: pending ? ? ?Thank you for allowing pharmacy to be a part of this patient?s care. ? ?Kerry Randall, PharmD ?PGY1 Pharmacy Resident ?03/29/2022 5:13 PM  ? ?Please check AMION for all [facility] Pharmacy phone numbers ?After 10:00 PM, call Main Pharmacy [phone] ? ? ?

## 2022-03-30 ENCOUNTER — Other Ambulatory Visit: Payer: Self-pay

## 2022-03-30 DIAGNOSIS — E11628 Type 2 diabetes mellitus with other skin complications: Secondary | ICD-10-CM | POA: Diagnosis not present

## 2022-03-30 DIAGNOSIS — I96 Gangrene, not elsewhere classified: Secondary | ICD-10-CM | POA: Diagnosis not present

## 2022-03-30 DIAGNOSIS — L089 Local infection of the skin and subcutaneous tissue, unspecified: Secondary | ICD-10-CM | POA: Diagnosis not present

## 2022-03-30 LAB — GLUCOSE, CAPILLARY
Glucose-Capillary: 232 mg/dL — ABNORMAL HIGH (ref 70–99)
Glucose-Capillary: 251 mg/dL — ABNORMAL HIGH (ref 70–99)

## 2022-03-30 LAB — CBG MONITORING, ED
Glucose-Capillary: 135 mg/dL — ABNORMAL HIGH (ref 70–99)
Glucose-Capillary: 176 mg/dL — ABNORMAL HIGH (ref 70–99)
Glucose-Capillary: 177 mg/dL — ABNORMAL HIGH (ref 70–99)
Glucose-Capillary: 213 mg/dL — ABNORMAL HIGH (ref 70–99)
Glucose-Capillary: 245 mg/dL — ABNORMAL HIGH (ref 70–99)

## 2022-03-30 LAB — BASIC METABOLIC PANEL
Anion gap: 10 (ref 5–15)
BUN: 14 mg/dL (ref 8–23)
CO2: 19 mmol/L — ABNORMAL LOW (ref 22–32)
Calcium: 8.7 mg/dL — ABNORMAL LOW (ref 8.9–10.3)
Chloride: 110 mmol/L (ref 98–111)
Creatinine, Ser: 0.92 mg/dL (ref 0.44–1.00)
GFR, Estimated: >60 mL/min (ref >60)
Glucose, Bld: 203 mg/dL — ABNORMAL HIGH (ref 70–99)
Potassium: 4 mmol/L (ref 3.5–5.1)
Sodium: 139 mmol/L (ref 135–145)

## 2022-03-30 LAB — CBC
HCT: 35 % — ABNORMAL LOW (ref 36.0–46.0)
Hemoglobin: 11.1 g/dL — ABNORMAL LOW (ref 12.0–15.0)
MCH: 29.2 pg (ref 26.0–34.0)
MCHC: 31.7 g/dL (ref 30.0–36.0)
MCV: 92.1 fL (ref 80.0–100.0)
Platelets: 229 10*3/uL (ref 150–400)
RBC: 3.8 MIL/uL — ABNORMAL LOW (ref 3.87–5.11)
RDW: 14 % (ref 11.5–15.5)
WBC: 12.3 10*3/uL — ABNORMAL HIGH (ref 4.0–10.5)
nRBC: 0 % (ref 0.0–0.2)

## 2022-03-30 LAB — HEMOGLOBIN A1C
Hgb A1c MFr Bld: 7 % — ABNORMAL HIGH (ref 4.8–5.6)
Mean Plasma Glucose: 154.2 mg/dL

## 2022-03-30 LAB — C-REACTIVE PROTEIN: CRP: 18 mg/dL — ABNORMAL HIGH (ref <1.0)

## 2022-03-30 LAB — SEDIMENTATION RATE: Sed Rate: 85 mm/hr — ABNORMAL HIGH (ref 0–22)

## 2022-03-30 MED ORDER — VANCOMYCIN HCL 1250 MG/250ML IV SOLN
1250.0000 mg | INTRAVENOUS | Status: AC
  Administered 2022-03-30 – 2022-04-06 (×7): 1250 mg via INTRAVENOUS
  Filled 2022-03-30 (×7): qty 250

## 2022-03-30 MED ORDER — PNEUMOCOCCAL 20-VAL CONJ VACC 0.5 ML IM SUSY
0.5000 mL | PREFILLED_SYRINGE | INTRAMUSCULAR | Status: AC
  Administered 2022-03-31: 0.5 mL via INTRAMUSCULAR
  Filled 2022-03-30: qty 0.5

## 2022-03-30 MED ORDER — POLYETHYLENE GLYCOL 3350 17 G PO PACK
17.0000 g | PACK | Freq: Every day | ORAL | Status: DC
  Administered 2022-03-30 – 2022-04-05 (×4): 17 g via ORAL
  Filled 2022-03-30 (×6): qty 1

## 2022-03-30 NOTE — ED Notes (Signed)
Transport arrived at this time 

## 2022-03-30 NOTE — ED Notes (Signed)
Received verbal report from Alana B RN at this time °

## 2022-03-30 NOTE — Plan of Care (Signed)

## 2022-03-30 NOTE — Hospital Course (Signed)
61 y.o. female with medical history significant for insulin-dependent diabetes mellitus, peripheral neuropathy, CKD 3A, depression, anxiety, CAD with stent to RCA in 2016, PAD with balloon angioplasty of the distal SFA and right common iliac artery stent in June 2022, and right foot ulcer who presents at the direction of her podiatrist for evaluation of worsening right foot ulcer.  Patient has history of right transmetatarsal amputation by her podiatrist and has had an ulcer involving the stump and plantar right midfoot that has been enlarging, draining intermittently, and has recently become black around the distal edge.  She had some chills recently.  She was evaluated by her podiatrist for this on 03/26/2022 and advised to seek further evaluation in the hospital. ?  ?She reports falling off of her couch while sleeping last night and developing swelling around the left eye.  She denies any change in her vision, denies pain with eye movement, and reports that the swelling has been improving ?

## 2022-03-30 NOTE — Progress Notes (Signed)
?Progress Note ? ? ?Patient: Kerry Randall VZD:[id number] DOB: 19-Jul-1961 DOA: 03/29/2022     1 ?DOS: the patient was seen and examined on 03/30/2022 ?  ?Brief hospital course: ?61 y.o. female with medical history significant for insulin-dependent diabetes mellitus, peripheral neuropathy, CKD 3A, depression, anxiety, CAD with stent to RCA in 2016, PAD with balloon angioplasty of the distal SFA and right common iliac artery stent in June 2022, and right foot ulcer who presents at the direction of her podiatrist for evaluation of worsening right foot ulcer.  Patient has history of right transmetatarsal amputation by her podiatrist and has had an ulcer involving the stump and plantar right midfoot that has been enlarging, draining intermittently, and has recently become black around the distal edge.  She had some chills recently.  She was evaluated by her podiatrist for this on 03/26/2022 and advised to seek further evaluation in the hospital. ?  ?She reports falling off of her couch while sleeping last night and developing swelling around the left eye.  She denies any change in her vision, denies pain with eye movement, and reports that the swelling has been improving ? ?Assessment and Plan: ?No notes have been filed under this hospital service. ?Service: Hospitalist ? ?1. Diabetic right foot ulcer  ?- Diabetic with neuropathy and hx of right transmetatarsal amputation p/w worsening right foot ulceration with gangrenous changes   ?- MRI foot with no abscess but suspicion for osteo involving metatarsal stumps ?- Appreciate vascular and orthopedic surgery consultants  ?- Plan to continue antibiotics ?- Plan for angiogram Monday ?-Eliquis currently on hold ?  ?2. PAD  ?- Hx of balloon angioplasty of distal right SFA and right common iliac artery stent in June 2022  ?- No longer on Plavix (takes Eliquis)  ?-Vascular surgery following, planning angiogram Monday ?  ?3. Paroxysmal atrial fibrillation  ?- CHADS-VASc at least 4  (gender, PAD, HTN, DM)  ?- Hold Eliquis pending surgical consultation (last dose was 5/2), continue metoprolol   ?-Currently rate controlled ?  ?4. Insulin-dependent DM   ?- A1c was 8.4% in April 2021  ?- Current A1c now improved and controlled at 7.0 ?  ?5. Depression, anxiety  ?- Continue Cymbalta and Vistaril  ?  ?6. CAD  ?- Hx of DES to RCA in 2016  ?- No anginal complaints  ?- Continue beta-blocker   ?- Denies chest pains ?  ?7. CKD IIIa ?- SCr is 1.08 on admission, appears to be at baseline ?- Renally-dose medications, monitor  ?  ? ?  ? ?Subjective: Without complaints this AM ? ?Physical Exam: ?Vitals:  ? 03/30/22 1500 03/30/22 1600 03/30/22 1700 03/30/22 1800  ?BP: 139/87 (!) 123/52 (!) 120/53 131/72  ?Pulse: 76 79 72 81  ?Resp: 17 16 16 16   ?Temp:      ?TempSrc:      ?SpO2: 92% 96% 93% 100%  ?Weight:      ?Height:      ? ?General exam: Awake, laying in bed, in nad ?Respiratory system: Normal respiratory effort, no wheezing ?Cardiovascular system: regular rate, s1, s2 ?Gastrointestinal system: Soft, nondistended, positive BS ?Central nervous system: CN2-12 grossly intact, strength intact ?Extremities: Partial amputation of R foot, no clubbing ?Skin: Normal skin turgor, no rashes ?Psychiatry: Mood normal // no visual hallucinations  ? ?Data Reviewed: ? ?Labs reviewed: A1c 7.0, Cr 0.92 ? ?Family Communication: Pt in room, family not at bedside ? ?Disposition: ?Status is: Inpatient ?Remains inpatient appropriate because: Severity of illness ? Planned Discharge  Destination: Home ? ? ? ? ?Author: ?Rickey Barbara, MD ?03/30/2022 6:42 PM ? ?For on call review [email or web address].  ?

## 2022-03-30 NOTE — ED Notes (Signed)
Pt has a wound 8.7cmx5.2 cm on the posterior of right foot. The wound is necrotic and has a foul smell. Pt's foot is warm to touch, cap refill less than 3 sec, pt able to move foot. Pedal pulse and posterior tibial pulse is absent by palpation and doppler. ? ?

## 2022-03-31 DIAGNOSIS — I96 Gangrene, not elsewhere classified: Secondary | ICD-10-CM | POA: Diagnosis not present

## 2022-03-31 DIAGNOSIS — E11628 Type 2 diabetes mellitus with other skin complications: Secondary | ICD-10-CM | POA: Diagnosis not present

## 2022-03-31 DIAGNOSIS — E119 Type 2 diabetes mellitus without complications: Secondary | ICD-10-CM | POA: Diagnosis not present

## 2022-03-31 DIAGNOSIS — I480 Paroxysmal atrial fibrillation: Secondary | ICD-10-CM | POA: Diagnosis not present

## 2022-03-31 LAB — APTT
aPTT: 33 seconds (ref 24–36)
aPTT: 39 seconds — ABNORMAL HIGH (ref 24–36)

## 2022-03-31 LAB — GLUCOSE, CAPILLARY
Glucose-Capillary: 156 mg/dL — ABNORMAL HIGH (ref 70–99)
Glucose-Capillary: 129 mg/dL — ABNORMAL HIGH (ref 70–99)
Glucose-Capillary: 251 mg/dL — ABNORMAL HIGH (ref 70–99)
Glucose-Capillary: 194 mg/dL — ABNORMAL HIGH (ref 70–99)
Glucose-Capillary: 166 mg/dL — ABNORMAL HIGH (ref 70–99)
Glucose-Capillary: 223 mg/dL — ABNORMAL HIGH (ref 70–99)

## 2022-03-31 LAB — BASIC METABOLIC PANEL
Anion gap: 5 (ref 5–15)
BUN: 16 mg/dL (ref 8–23)
CO2: 24 mmol/L (ref 22–32)
Calcium: 8.7 mg/dL — ABNORMAL LOW (ref 8.9–10.3)
Chloride: 108 mmol/L (ref 98–111)
Creatinine, Ser: 1.05 mg/dL — ABNORMAL HIGH (ref 0.44–1.00)
GFR, Estimated: 60 mL/min — ABNORMAL LOW (ref >60)
Glucose, Bld: 225 mg/dL — ABNORMAL HIGH (ref 70–99)
Potassium: 4.7 mmol/L (ref 3.5–5.1)
Sodium: 137 mmol/L (ref 135–145)

## 2022-03-31 LAB — CBC
HCT: 34.3 % — ABNORMAL LOW (ref 36.0–46.0)
Hemoglobin: 11.1 g/dL — ABNORMAL LOW (ref 12.0–15.0)
MCH: 29.4 pg (ref 26.0–34.0)
MCHC: 32.4 g/dL (ref 30.0–36.0)
MCV: 90.7 fL (ref 80.0–100.0)
Platelets: 222 10*3/uL (ref 150–400)
RBC: 3.78 MIL/uL — ABNORMAL LOW (ref 3.87–5.11)
RDW: 13.9 % (ref 11.5–15.5)
WBC: 13 10*3/uL — ABNORMAL HIGH (ref 4.0–10.5)
nRBC: 0 % (ref 0.0–0.2)

## 2022-03-31 LAB — C-REACTIVE PROTEIN: CRP: 16.2 mg/dL — ABNORMAL HIGH (ref <1.0)

## 2022-03-31 LAB — HEPARIN LEVEL (UNFRACTIONATED)
Heparin Unfractionated: <0.1 IU/mL — ABNORMAL LOW (ref 0.30–0.70)
Heparin Unfractionated: <0.1 IU/mL — ABNORMAL LOW (ref 0.30–0.70)

## 2022-03-31 LAB — SEDIMENTATION RATE: Sed Rate: 94 mm/hr — ABNORMAL HIGH (ref 0–22)

## 2022-03-31 MED ORDER — HEPARIN (PORCINE) 25000 UT/250ML-% IV SOLN
1850.0000 [IU]/h | INTRAVENOUS | Status: DC
  Administered 2022-03-31: 1300 [IU]/h via INTRAVENOUS
  Administered 2022-04-01: 1550 [IU]/h via INTRAVENOUS
  Administered 2022-04-01 – 2022-04-02 (×2): 1850 [IU]/h via INTRAVENOUS
  Filled 2022-03-31 (×4): qty 250

## 2022-03-31 NOTE — Progress Notes (Signed)
ANTICOAGULATION CONSULT NOTE - Initial Consult ? ?Pharmacy Consult for heparin ?Indication: atrial fibrillation ? ?Allergies  ?Allergen Reactions  ? Erythromycin Hives  ? Iodinated Contrast Media Swelling  ? Levofloxacin Hives and Swelling  ? Oxycodone-Acetaminophen Anaphylaxis  ?  "Percocet" ?"Percocet" ?  ? Penicillins Anaphylaxis  ?  Hypotension ?  ? Rosuvastatin Rash, Hives and Other (See Comments)  ?  Muscle Cramps and weakness ?  ? Sulfa Antibiotics Hives  ? ? ?Patient Measurements: ?Height: 5\' 9"  (175.3 cm) ?Weight: 92.4 kg (203 lb 11.3 oz) ?IBW/kg (Calculated) : 66.2 ?Heparin Dosing Weight: 86.4 kg ? ?Vital Signs: ?Temp: 98.3 ?F (36.8 ?C) (05/06 1632) ?Temp Source: Oral (05/06 08-30-1983) ?BP: 119/64 (05/06 1632) ?Pulse Rate: 75 (05/06 1632) ? ?Labs: ?Recent Labs  ?  03/29/22 ?1427 03/30/22 ?0400 03/31/22 ?0031 03/31/22 ?1058 03/31/22 ?1828  ?HGB 11.9* 11.1* 11.1*  --   --   ?HCT 36.9 35.0* 34.3*  --   --   ?PLT 266 229 222  --   --   ?APTT  --   --   --  33 39*  ?HEPARINUNFRC  --   --   --  <0.10* <0.10*  ?CREATININE 1.08* 0.92 1.05*  --   --   ? ? ? ?Estimated Creatinine Clearance: 66.4 mL/min (A) (by C-G formula based on SCr of 1.05 mg/dL (H)). ? ? ?Medical History: ?Past Medical History:  ?Diagnosis Date  ? Atrial fibrillation (HCC)   ? Colon polyps   ? Diabetes mellitus without complication (HCC)   ? GERD (gastroesophageal reflux disease)   ? Heart disease, hyperkinetic   ? Hypercholesteremia   ? Hypertension   ? Kidney disease   ? Leg pain   ? Major depressive disorder, recurrent (HCC)   ? Palpitations   ? Vitamin D deficiency   ? ? ?Medications:  ?Scheduled:  ? DULoxetine  60 mg Oral BID  ? fesoterodine  8 mg Oral Daily  ? gabapentin  1,200 mg Oral BID  ? hydrOXYzine  75 mg Oral Daily  ? insulin aspart  0-9 Units Subcutaneous Q4H  ? insulin glargine-yfgn  5 Units Subcutaneous Daily  ? metoprolol succinate  50 mg Oral Daily  ? mirabegron ER  25 mg Oral Daily  ? pantoprazole  40 mg Oral Daily  ? polyethylene  glycol  17 g Oral Daily  ? ? ?Assessment: ?52 yof admitted with R stump/foot ulcer. Plans for BKA/angiogram on 5/8. PTA apixaban for Afib (CHADSVASC 4). With planned procedure, apixaban held and anticoagulation started with IV heparin. Patient unsure when last apixaban dose was, states possibly ~5/1. Renal function stable, CrCl ~66 ml/min. Hemoglobin stable today at 11.1, plts wnl at 222.  ? ?Initial heparin level is <0.1, aPTT 39 seconds - will dose via heparin levels and stop aPTT checks. ? ?Goal of Therapy:  ?Heparin level 0.3-0.7 units/ml ?aPTT 66-102 seconds ?Monitor platelets by anticoagulation protocol: Yes ?  ?Plan:  ?Increase heparin to 1550 units/h ?Recheck heparin level in 8h ? ?7/8, PharmD, BCPS, BCCP ?Clinical Pharmacist ?[phone] ?Please check AMION for all [facility] Pharmacy numbers ?03/31/2022 ? ? ?

## 2022-03-31 NOTE — Progress Notes (Signed)
ANTICOAGULATION CONSULT NOTE - Initial Consult ? ?Pharmacy Consult for heparin ?Indication: atrial fibrillation ? ?Allergies  ?Allergen Reactions  ? Erythromycin Hives  ? Iodinated Contrast Media Swelling  ? Levofloxacin Hives and Swelling  ? Oxycodone-Acetaminophen Anaphylaxis  ?  "Percocet" ?"Percocet" ?  ? Penicillins Anaphylaxis  ?  Hypotension ?  ? Rosuvastatin Rash, Hives and Other (See Comments)  ?  Muscle Cramps and weakness ?  ? Sulfa Antibiotics Hives  ? ? ?Patient Measurements: ?Height: 5\' 9"  (175.3 cm) ?Weight: 92.4 kg (203 lb 11.3 oz) ?IBW/kg (Calculated) : 66.2 ?Heparin Dosing Weight: 86.4 kg ? ?Vital Signs: ?Temp: 98.5 ?F (36.9 ?C) (05/06 08-30-1983) ?Temp Source: Oral (05/06 08-30-1983) ?BP: 118/91 (05/06 0817) ?Pulse Rate: 82 (05/06 0817) ? ?Labs: ?Recent Labs  ?  03/29/22 ?1427 03/30/22 ?0400 03/31/22 ?0031  ?HGB 11.9* 11.1* 11.1*  ?HCT 36.9 35.0* 34.3*  ?PLT 266 229 222  ?CREATININE 1.08* 0.92 1.05*  ? ? ?Estimated Creatinine Clearance: 66.4 mL/min (A) (by C-G formula based on SCr of 1.05 mg/dL (H)). ? ? ?Medical History: ?Past Medical History:  ?Diagnosis Date  ? Atrial fibrillation (HCC)   ? Colon polyps   ? Diabetes mellitus without complication (HCC)   ? GERD (gastroesophageal reflux disease)   ? Heart disease, hyperkinetic   ? Hypercholesteremia   ? Hypertension   ? Kidney disease   ? Leg pain   ? Major depressive disorder, recurrent (HCC)   ? Palpitations   ? Vitamin D deficiency   ? ? ?Medications:  ?Scheduled:  ? DULoxetine  60 mg Oral BID  ? fesoterodine  8 mg Oral Daily  ? gabapentin  1,200 mg Oral BID  ? hydrOXYzine  75 mg Oral Daily  ? insulin aspart  0-9 Units Subcutaneous Q4H  ? insulin glargine-yfgn  5 Units Subcutaneous Daily  ? metoprolol succinate  50 mg Oral Daily  ? mirabegron ER  25 mg Oral Daily  ? pantoprazole  40 mg Oral Daily  ? pneumococcal 20-valent conjugate vaccine  0.5 mL Intramuscular Tomorrow-1000  ? polyethylene glycol  17 g Oral Daily  ? ? ?Assessment: ?32 yof admitted with R  stump/foot ulcer. Plans for BKA/angiogram on 5/8. PTA apixaban for Afib (CHADSVASC 4). With planned procedure, apixaban held and anticoagulation started with IV heparin. Patient unsure when last apixaban dose was, states possibly ~5/1. Renal function stable, CrCl ~66 ml/min. Hemoglobin stable today at 11.1, plts wnl at 222.  ? ?Goal of Therapy:  ?Heparin level 0.3-0.7 units/ml ?aPTT 66-102 seconds ?Monitor platelets by anticoagulation protocol: Yes ?  ?Plan:  ?No bolus since unsure when patient's last apixaban dose was ?Start heparin infusion at 1300 units/hour (47ml/hr) ?Check anti-Xa level and aPTT in 6 hours (ordered for 1700) and daily while on heparin ?HL only when levels correlate  ?Continue to monitor H&H and platelets ?F/u 5/8 procedure and changing anticoagulation back to apixaban  ? ? ?7/8, PharmD ?03/31/2022,10:06 AM ? ? ?

## 2022-03-31 NOTE — Progress Notes (Signed)
?      ?                 PROGRESS NOTE ? ?      ?PATIENT DETAILS ?Name: Kerry Randall ?Age: 61 y.o. ?Sex: female ?Date of Birth: 1961/08/25 ?Admit Date: 03/29/2022 ?Admitting Physician Vianne Bulls, MD ?[id number], [place] ? ?Brief Summary: ?Patient is a 61 y.o.  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  See below for further details. ? ?Significant events: ?5/4>> admit to [facility] for necrotic right TMA stump. ? 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 ? ?Significant microbiology data: ?5/4>> COVID/influenza PCR: Negative ?5/4>> blood culture: No growth ? ?Procedures: ?None ? ?Consults: ?Orthopedics, vascular surgery ? ?Subjective: ?Lying comfortably in bed-denies any chest pain or shortness of breath. ? ?Objective: ?Vitals: ?Blood pressure (!) 118/91, pulse 82, temperature 98.5 ?F (36.9 ?C), temperature source Oral, resp. rate 18, height 5\' 9"  (1.753 m), weight 92.4 kg, SpO2 (!) 87 %.  ? ?Exam: ?Gen Exam:Alert awake-not in any distress ?HEENT:atraumatic, normocephalic ?Chest: B/L clear to auscultation anteriorly ?CVS:S1S2 regular ?Abdomen:soft non tender, non distended ?Extremities: Right foot dressing in place-did not open.  Foul-smelling ?Neurology: Non focal ?Skin: no rash ? ?Pertinent Labs/Radiology: ? ?  Latest Ref Rng & Units 03/31/2022  ? 12:31 AM 03/30/2022  ?  4:00 AM 03/29/2022  ?  2:27 PM  ?CBC  ?WBC 4.0 - 10.5 K/uL 13.0   12.3   13.6    ?Hemoglobin 12.0 - 15.0 g/dL 11.1   11.1   11.9    ?Hematocrit 36.0 - 46.0 % 34.3   35.0   36.9    ?Platelets 150 - 400 K/uL 222   229   266    ?  ?Lab Results  ?Component Value Date  ? NA 137 03/31/2022  ? K 4.7 03/31/2022  ? CL 108 03/31/2022  ? CO2 24 03/31/2022  ?  ? ?Assessment/Plan: ?Necrotic/nonhealing right TMA  stump/critical right limb ischemia:  Vascular surgery/orthopedics following-angiogram scheduled for Monday-subsequently will require either BKA/AKA given extent of tissue loss.  In the meantime-continue with broad-spectrum antimicrobial therapy. ? ?PAF: Rate controlled-continue metoprolol-Eliquis on hold for possible angiogram Monday-in the interim-we will start IV heparin.  CHA2DS2-VASc of at least 4. ? ?PAD-s/p right common iliac artery stent June 2022-angioplasty of distal right SFA: Vascular surgery following-angiogram/aortogram scheduled for Monday. ? ?CAD s/p PCI to RCA 2016: No anginal symptoms-continue anticoagulation-suspect not on antiplatelets is on anticoagulation. ? ?DM-2 (A1c 7.0 on 5/5): CBGs relatively stable-continue 5 units of Semglee and SSI.  Follow and adjust. ? ?Recent Labs  ?  03/30/22 ?2307 03/31/22 ?0309 03/31/22 ?0822  ?GLUCAP 251* 156* 129*  ?  ?Peripheral neuropathy: Continue Neurontin/Cymbalta ? ?CKD stage IIIa: Creatinine close to baseline-follow periodically. ? ?Normocytic anemia: Mild-likely due to combination of acute illness/CKD-follow CBC periodically. ? ?Pressure Ulcer: ?Pressure Injury 03/30/22 Coccyx Stage 2 -  Partial thickness loss of dermis presenting as a shallow open injury with a red, pink wound bed without slough. (Active)  ?03/30/22 2200  ?Location: Coccyx  ?Location Orientation:   ?Staging: Stage 2 -  Partial thickness loss of dermis presenting as a shallow open injury with a red, pink wound bed without slough.  ?Wound Description (Comments):   ?Present on Admission: Yes  ?Dressing Type Foam -  Lift dressing to assess site every shift 03/30/22 2200  ? ?Obesity: ?Estimated body mass index is 30.08 kg/m? as calculated from the following: ?  Height as of this encounter: 5\' 9"  (1.753 m). ?  Weight as of this encounter: 92.4 kg.  ? ?Code status: ?  Code Status: Full Code  ? ?DVT Prophylaxis ?Place and maintain sequential compression device Start: 03/29/22 2235 ?Starting IV  heparin 5/6. ?  ?Family Communication: She prefers to update family herself-I have asked her to let us know if she changes her mind or if her family members have questions. ? ? ?Disposition Plan: ?Status is: Inpatient ?Remains inpatient appropriate because: Necrotic right TMA stump/critical limb ischemia-not stable for discharge. ?  ?Planned Discharge Destination:Home health ? ? ?Diet: ?Diet Order   ? ?       ?  Diet renal/carb modified with fluid restriction Diet-HS Snack? Nothing; Fluid restriction: 1200 mL Fluid; Room service appropriate? Yes; Fluid consistency: Thin  Diet effective now       ?  ? ?  ?  ? ?  ?  ? ? ?Antimicrobial agents: ?Anti-infectives (From admission, onward)  ? ? Start     Dose/Rate Route Frequency Ordered Stop  ? 03/30/22 2200  vancomycin (VANCOREADY) IVPB 1250 mg/250 mL       ? 1,250 mg ?166.7 mL/hr over 90 Minutes Intravenous Every 24 hours 03/30/22 1736    ? 03/30/22 1900  vancomycin (VANCOCIN) IVPB 1000 mg/200 mL premix  Status:  Discontinued       ? 1,000 mg ?200 mL/hr over 60 Minutes Intravenous Every 24 hours 03/29/22 1749 03/30/22 1736  ? 03/30/22 0700  vancomycin (VANCOCIN) IVPB 1000 mg/200 mL premix  Status:  Discontinued       ? 1,000 mg ?200 mL/hr over 60 Minutes Intravenous Every 24 hours 03/29/22 1748 03/29/22 1749  ? 03/29/22 1730  vancomycin (VANCOREADY) IVPB 2000 mg/400 mL       ? 2,000 mg ?200 mL/hr over 120 Minutes Intravenous  Once 03/29/22 1725 03/29/22 2344  ? 03/29/22 1730  aztreonam (AZACTAM) 2 g in sodium chloride 0.9 % 100 mL IVPB       ? 2 g ?200 mL/hr over 30 Minutes Intravenous Every 8 hours 03/29/22 1725    ? 03/29/22 1715  metroNIDAZOLE (FLAGYL) IVPB 500 mg       ? 500 mg ?100 mL/hr over 60 Minutes Intravenous Every 12 hours 03/29/22 1701    ? ?  ? ? ? ?MEDICATIONS: ?Scheduled Meds: ? DULoxetine  60 mg Oral BID  ? fesoterodine  8 mg Oral Daily  ? gabapentin  1,200 mg Oral BID  ? hydrOXYzine  75 mg Oral Daily  ? insulin aspart  0-9 Units Subcutaneous Q4H  ?  insulin glargine-yfgn  5 Units Subcutaneous Daily  ? metoprolol succinate  50 mg Oral Daily  ? mirabegron ER  25 mg Oral Daily  ? pantoprazole  40 mg Oral Daily  ? pneumococcal 20-valent conjugate vaccine  0.5 mL Intramuscular Tomorrow-1000  ? polyethylene glycol  17 g Oral Daily  ? ?Continuous Infusions: ? aztreonam 2 g (03/31/22 [id number])  ? metronidazole 500 mg (03/31/22 0442)  ? vancomycin 1,250 mg (03/30/22 2251)  ? ?PRN Meds:.acetaminophen **OR** acetaminophen, albuterol, fentaNYL (SUBLIMAZE) injection, ondansetron **OR** ondansetron (ZOFRAN) IV ? ? ?I have personally reviewed following labs and imaging studies ? ?LABORATORY DATA: ?CBC: ?Recent Labs  ?Lab 03/29/22 ?1427 03/30/22 ?0400 03/31/22 ?0031  ?WBC 13.6* 12.3* 13.0*  ?NEUTROABS 9.7*  --   --   ?  HGB 11.9* 11.1* 11.1*  ?HCT 36.9 35.0* 34.3*  ?MCV 90.2 92.1 90.7  ?PLT 266 229 222  ? ? ?Basic Metabolic Panel: ?Recent Labs  ?Lab 03/29/22 ?1427 03/30/22 ?0400 03/31/22 ?0031  ?NA 137 139 137  ?K 4.2 4.0 4.7  ?CL 110 110 108  ?CO2 20* 19* 24  ?GLUCOSE 296* 203* 225*  ?BUN 17 14 16   ?CREATININE 1.08* 0.92 1.05*  ?CALCIUM 8.6* 8.7* 8.7*  ? ? ?GFR: ?Estimated Creatinine Clearance: 66.4 mL/min (A) (by C-G formula based on SCr of 1.05 mg/dL (H)). ? ?Liver Function Tests: ?Recent Labs  ?Lab 03/29/22 ?1427  ?AST 9*  ?ALT 10  ?ALKPHOS 89  ?BILITOT 0.5  ?PROT 6.9  ?ALBUMIN 2.6*  ? ?No results for input(s): LIPASE, AMYLASE in the last 168 hours. ?No results for input(s): AMMONIA in the last 168 hours. ? ?Coagulation Profile: ?No results for input(s): INR, PROTIME in the last 168 hours. ? ?Cardiac Enzymes: ?No results for input(s): CKTOTAL, CKMB, CKMBINDEX, TROPONINI in the last 168 hours. ? ?BNP (last 3 results) ?No results for input(s): PROBNP in the last 8760 hours. ? ?Lipid Profile: ?No results for input(s): CHOL, HDL, LDLCALC, TRIG, CHOLHDL, LDLDIRECT in the last 72 hours. ? ?Thyroid Function Tests: ?No results for input(s): TSH, T4TOTAL, FREET4, T3FREE, THYROIDAB in the  last 72 hours. ? ?Anemia Panel: ?No results for input(s): VITAMINB12, FOLATE, FERRITIN, TIBC, IRON, RETICCTPCT in the last 72 hours. ? ?Urine analysis: ?No results found for: COLORURINE, APPEARANCEUR, [place]

## 2022-04-01 DIAGNOSIS — L089 Local infection of the skin and subcutaneous tissue, unspecified: Secondary | ICD-10-CM | POA: Diagnosis not present

## 2022-04-01 DIAGNOSIS — E11628 Type 2 diabetes mellitus with other skin complications: Secondary | ICD-10-CM | POA: Diagnosis not present

## 2022-04-01 LAB — HEPARIN LEVEL (UNFRACTIONATED)
Heparin Unfractionated: 0.13 IU/mL — ABNORMAL LOW (ref 0.30–0.70)
Heparin Unfractionated: 0.3 IU/mL (ref 0.30–0.70)

## 2022-04-01 LAB — CBC
HCT: 32.5 % — ABNORMAL LOW (ref 36.0–46.0)
Hemoglobin: 10.6 g/dL — ABNORMAL LOW (ref 12.0–15.0)
MCH: 29.4 pg (ref 26.0–34.0)
MCHC: 32.6 g/dL (ref 30.0–36.0)
MCV: 90 fL (ref 80.0–100.0)
Platelets: 193 10*3/uL (ref 150–400)
RBC: 3.61 MIL/uL — ABNORMAL LOW (ref 3.87–5.11)
RDW: 13.6 % (ref 11.5–15.5)
WBC: 11.6 10*3/uL — ABNORMAL HIGH (ref 4.0–10.5)
nRBC: 0 % (ref 0.0–0.2)

## 2022-04-01 LAB — GLUCOSE, CAPILLARY
Glucose-Capillary: 170 mg/dL — ABNORMAL HIGH (ref 70–99)
Glucose-Capillary: 153 mg/dL — ABNORMAL HIGH (ref 70–99)
Glucose-Capillary: 300 mg/dL — ABNORMAL HIGH (ref 70–99)
Glucose-Capillary: 164 mg/dL — ABNORMAL HIGH (ref 70–99)
Glucose-Capillary: 163 mg/dL — ABNORMAL HIGH (ref 70–99)
Glucose-Capillary: 190 mg/dL — ABNORMAL HIGH (ref 70–99)
Glucose-Capillary: 265 mg/dL — ABNORMAL HIGH (ref 70–99)

## 2022-04-01 LAB — MRSA NEXT GEN BY PCR, NASAL: MRSA by PCR Next Gen: NOT DETECTED

## 2022-04-01 LAB — BASIC METABOLIC PANEL
Anion gap: 8 (ref 5–15)
BUN: 18 mg/dL (ref 8–23)
CO2: 21 mmol/L — ABNORMAL LOW (ref 22–32)
Calcium: 8.7 mg/dL — ABNORMAL LOW (ref 8.9–10.3)
Chloride: 108 mmol/L (ref 98–111)
Creatinine, Ser: 0.98 mg/dL (ref 0.44–1.00)
GFR, Estimated: >60 mL/min (ref >60)
Glucose, Bld: 175 mg/dL — ABNORMAL HIGH (ref 70–99)
Potassium: 4.6 mmol/L (ref 3.5–5.1)
Sodium: 137 mmol/L (ref 135–145)

## 2022-04-01 LAB — ABO/RH: ABO/RH(D): A POS

## 2022-04-01 MED ORDER — JUVEN PO PACK
1.0000 | PACK | Freq: Two times a day (BID) | ORAL | Status: DC
  Administered 2022-04-01 – 2022-04-03 (×4): 1 via ORAL
  Filled 2022-04-01 (×2): qty 1

## 2022-04-01 MED ORDER — ENSURE ENLIVE PO LIQD
237.0000 mL | Freq: Two times a day (BID) | ORAL | Status: DC
  Administered 2022-04-01 – 2022-04-07 (×8): 237 mL via ORAL

## 2022-04-01 MED ORDER — HEPARIN BOLUS VIA INFUSION
2000.0000 [IU] | Freq: Once | INTRAVENOUS | Status: AC
  Administered 2022-04-01: 2000 [IU] via INTRAVENOUS
  Filled 2022-04-01: qty 2000

## 2022-04-01 MED ORDER — METOPROLOL SUCCINATE ER 25 MG PO TB24
25.0000 mg | ORAL_TABLET | Freq: Every day | ORAL | Status: DC
  Administered 2022-04-02 – 2022-04-05 (×4): 25 mg via ORAL
  Filled 2022-04-01 (×4): qty 1

## 2022-04-01 NOTE — Progress Notes (Signed)
Pharmacy Antibiotic Note ? ?Kerry Randall is a 61 y.o. female admitted on 03/29/2022 with  wound infection .  MRI with concern for chronic OM. Pharmacy has been consulted for Vancomycin and Aztreonam dosing. ? ?Patient remains afebrile. Renal function has slightly improved since admission, Cr today 0.98, eCrCl ~71 ml/min. 5/4 BCX with ngtd. Plans for angiogram on 5/8 with subsequent amputation. Vancomycin 2 gram bolus given on 5/4 ? ?Plan: ?Continue vancomycin 1250 mg IV Q24h (eAUC 489, Scr 0.98, Vd 0.5, goal AUC 400-550) ?Continue aztreonam 2 g IV q8h  ?Continue metronidazole 500 mg IV q12h ?Follow-up clinical status, renal function ?Follow-up cultures, LOT, narrow as able  ?No levels ordered at this time. Anticipate vancomycin DC'd in the next few days after amputation  ? ?Temp (24hrs), Avg:98.6 ?F (37 ?C), Min:98.3 ?F (36.8 ?C), Max:99.1 ?F (37.3 ?C) ? ?Recent Labs  ?Lab 03/29/22 ?1427 03/30/22 ?0400 03/31/22 ?0031 04/01/22 ?0335  ?WBC 13.6* 12.3* 13.0* 11.6*  ?CREATININE 1.08* 0.92 1.05* 0.98  ?LATICACIDVEN 1.5  --   --   --   ?  ?Estimated Creatinine Clearance: 71.1 mL/min (by C-G formula based on SCr of 0.98 mg/dL).   ? ?Allergies  ?Allergen Reactions  ? Erythromycin Hives  ? Iodinated Contrast Media Swelling  ? Levofloxacin Hives and Swelling  ? Oxycodone-Acetaminophen Anaphylaxis  ?  "Percocet" ?"Percocet" ?  ? Penicillins Anaphylaxis  ?  Hypotension ?  ? Rosuvastatin Rash, Hives and Other (See Comments)  ?  Muscle Cramps and weakness ?  ? Sulfa Antibiotics Hives  ? ? ?Antimicrobials this admission: ?Vancomycin 5/4 >>  ?Aztreonam 5/4 >>  ?Flagyl 5/4 >>  ? ?Microbiology results: ?5/4 BCx: ngtd ? ? ?Thank you for allowing pharmacy to be a part of this patient?s care. ? ?Fara Olden, PharmD, BCPS ?Clinical Pharmacist ?04/01/2022 7:57 AM  ? ?Please check AMION for all [facility] Pharmacy phone numbers ?After 10:00 PM, call Main Pharmacy [phone] ? ? ?

## 2022-04-01 NOTE — Progress Notes (Signed)
Initial Nutrition Assessment ? ?DOCUMENTATION CODES:  ? ?Not applicable ? ?INTERVENTION:  ?Provide Ensure Enlive po BID, each supplement provides 350 kcal and 20 grams of protein. ? ?Provide Juven BID, each packet provides 95 calories, 2.5 grams of protein. ? ?Encourage adequate PO intake.  ? ?NUTRITION DIAGNOSIS:  ? ?Increased nutrient needs related to wound healing as evidenced by estimated needs. ? ?GOAL:  ? ?Patient will meet greater than or equal to 90% of their needs ? ?MONITOR:  ? ?PO intake, Supplement acceptance, Skin, Weight trends, Labs, I & O's ? ?REASON FOR ASSESSMENT:  ? ?Consult ?Wound healing ? ?ASSESSMENT:  ? ?61 y.o. 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 ? ?Per MD, angiogram scheduled for tomorrow may subsequently require AKA versus BKA based on results. Meal completion has been 100%. RD to order nutritional supplements to aid in caloric and protein needs as well as in wound healing. Unable to complete Nutrition-Focused physical exam at this time.  ? ?Labs and medications reviewed.  ? ?Diet Order:   ?Diet Order   ? ?       ?  Diet NPO time specified  Diet effective midnight       ?  ?  Diet renal/carb modified with fluid restriction Diet-HS Snack? Nothing; Fluid restriction: 1200 mL Fluid; Room service appropriate? Yes; Fluid consistency: Thin  Diet effective now       ?  ? ?  ?  ? ?  ? ? ?EDUCATION NEEDS:  ? ?Not appropriate for education at this time ? ?Skin:  Skin Assessment: Skin Integrity Issues: ?Skin Integrity Issues:: Stage II ?Stage II: coccyx ? ?Last BM:  5/7 ? ?Height:  ? ?Ht Readings from Last 1 Encounters:  ?03/30/22 5\' 9"  (1.753 m)  ? ? ?Weight:  ? ?Wt Readings from Last 1 Encounters:  ?03/30/22 92.4 kg  ? ?BMI:  Body mass index is 30.08 kg/m?. ? ?Estimated Nutritional Needs:  ? ?Kcal:  2100-2300 ? ?Protein:  110-125 grams ? ?Fluid:  1.2 L/day ? ?Corrin Parker, MS, RD,  LDN ?RD pager number/after hours weekend pager number on Amion. ? ?

## 2022-04-01 NOTE — Progress Notes (Signed)
ANTICOAGULATION CONSULT NOTE Pharmacy Consult for heparin ?Indication: atrial fibrillation ?Brief A/P: Heparin level subtherapeutic Increase Heparin rate ? ?Allergies  ?Allergen Reactions  ? Erythromycin Hives  ? Iodinated Contrast Media Swelling  ? Levofloxacin Hives and Swelling  ? Oxycodone-Acetaminophen Anaphylaxis  ?  "Percocet" ?"Percocet" ?  ? Penicillins Anaphylaxis  ?  Hypotension ?  ? Rosuvastatin Rash, Hives and Other (See Comments)  ?  Muscle Cramps and weakness ?  ? Sulfa Antibiotics Hives  ? ? ?Patient Measurements: ?Height: 5\' 9"  (175.3 cm) ?Weight: 92.4 kg (203 lb 11.3 oz) ?IBW/kg (Calculated) : 66.2 ?Heparin Dosing Weight: 86.4 kg ? ?Vital Signs: ?Temp: 98.7 ?F (37.1 ?C) (05/07 10-17-1986) ?Temp Source: Oral (05/07 10-17-1986) ?BP: 132/56 (05/07 0313) ?Pulse Rate: 73 (05/07 0313) ? ?Labs: ?Recent Labs  ?  03/30/22 ?0400 03/31/22 ?0031 03/31/22 ?1058 03/31/22 ?1828 04/01/22 ?0335  ?HGB 11.1* 11.1*  --   --  10.6*  ?HCT 35.0* 34.3*  --   --  32.5*  ?PLT 229 222  --   --  193  ?APTT  --   --  33 39*  --   ?HEPARINUNFRC  --   --  <0.10* <0.10* 0.13*  ?CREATININE 0.92 1.05*  --   --  0.98  ? ? ? ?Estimated Creatinine Clearance: 71.1 mL/min (by C-G formula based on SCr of 0.98 mg/dL). ? ?Assessment: ?61 y.o. female with h/o Afib, Eliquis on hold, for heparin ? ?Goal of Therapy:  ?Heparin level 0.3-0.7 units/ml ?aPTT 66-102 seconds ?Monitor platelets by anticoagulation protocol: Yes ?  ?Plan:  ?Heparin 2000 units IV bolus, then increase heparin 1850 units/hr ?Check heparin level in 6 hours. ? ?62, PharmD, BCPS ? ? ? ?

## 2022-04-01 NOTE — Progress Notes (Addendum)
?  Progress Note ? ? ? ?04/01/2022 ?9:53 AM ?* No surgery date entered * ? ?Subjective:  no complaints ? ? ?Vitals:  ? 04/01/22 0313 04/01/22 0900  ?BP: (!) 132/56 (!) 98/51  ?Pulse: 73 73  ?Resp: 18 18  ?Temp: 98.7 ?F (37.1 ?C) 98.1 ?F (36.7 ?C)  ?SpO2:    ? ?Physical Exam: ?Lungs:  non labored ?Extremities:  open malodorous R TMA wound ?Neurologic: A&O ? ?CBC ?   ?Component Value Date/Time  ? WBC 11.6 (H) 04/01/2022 0335  ? RBC 3.61 (L) 04/01/2022 0335  ? HGB 10.6 (L) 04/01/2022 0335  ? HGB 13.1 05/09/2021 1154  ? HCT 32.5 (L) 04/01/2022 0335  ? HCT 40.3 05/09/2021 1154  ? PLT 193 04/01/2022 0335  ? PLT 257 05/09/2021 1154  ? MCV 90.0 04/01/2022 0335  ? MCV 91 05/09/2021 1154  ? MCH 29.4 04/01/2022 0335  ? MCHC 32.6 04/01/2022 0335  ? RDW 13.6 04/01/2022 0335  ? RDW 14.0 05/09/2021 1154  ? LYMPHSABS 2.5 03/29/2022 1427  ? MONOABS 1.1 (H) 03/29/2022 1427  ? EOSABS 0.2 03/29/2022 1427  ? BASOSABS 0.1 03/29/2022 1427  ? ? ?BMET ?   ?Component Value Date/Time  ? NA 137 04/01/2022 0335  ? NA 142 05/16/2021 0951  ? K 4.6 04/01/2022 0335  ? CL 108 04/01/2022 0335  ? CO2 21 (L) 04/01/2022 0335  ? GLUCOSE 175 (H) 04/01/2022 0335  ? BUN 18 04/01/2022 0335  ? BUN 18 05/16/2021 0951  ? CREATININE 0.98 04/01/2022 0335  ? CALCIUM 8.7 (L) 04/01/2022 0335  ? GFRNONAA >60 04/01/2022 0335  ? ? ?INR ?No results found for: INR ? ? ?Intake/Output Summary (Last 24 hours) at 04/01/2022 0953 ?Last data filed at 04/01/2022 0913 ?Gross per 24 hour  ?Intake 1693.48 ml  ?Output --  ?Net 1693.48 ml  ? ? ? ?Assessment/Plan:  61 y.o. female with open R TMA wound ? ?Plan is for Aortogram with RLE runoff and possible intervention tomorrow with Dr. Randie Heinz to decide the level of leg amputation.  Case was discussed with the patient this morning and all questions were answered.  NPO past midnight.  Consent ordered. ? ? ?Emilie Rutter, PA-C ?[facility] ?[phone] ?04/01/2022 ?9:53 AM ? ?I agree with the above assessment and plan as  documented by the PA.  We discussed proceeding with angiogram tomorrow to determine the level amputation.  All questions answered. ? ?Durene Cal ? ?

## 2022-04-01 NOTE — Progress Notes (Signed)
?      ?                 PROGRESS NOTE ? ?      ?PATIENT DETAILS ?Name: Kerry Randall ?Age: 61 y.o. ?Sex: female ?Date of Birth: 05/15/1961 ?Admit Date: 03/29/2022 ?Admitting Physician Vianne Bulls, MD ?OX:[id number], [place] ? ?Brief Summary: ?Patient is a 60 y.o.  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  See below for further details. ? ?Significant events: ?5/4>> admit to [facility] for necrotic right TMA stump. ? 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 ? ?Significant microbiology data: ?5/4>> COVID/influenza PCR: Negative ?5/4>> blood culture: No growth ? ?Procedures: ?None ? ?Consults: ?Orthopedics, vascular surgery ? ?Subjective:  Patient in bed, appears comfortable, denies any headache, no fever, no chest pain or pressure, no shortness of breath , no abdominal pain. No new focal weakness. ? ? ?Objective: ?Vitals: ?Blood pressure (!) 98/51, pulse 73, temperature 98.1 ?F (36.7 ?C), temperature source Oral, resp. rate 18, height 5\' 9"  (1.753 m), weight 92.4 kg, SpO2 92 %.  ? ?Exam: ? ?Awake Alert, No new F.N deficits, Normal affect ?[place].AT,PERRAL ?Supple Neck, No JVD,   ?Symmetrical Chest wall movement, Good air movement bilaterally, CTAB ?RRR,No Gallops, Rubs or new Murmurs,  ?+ve B.Sounds, Abd Soft, No tenderness,   ?Right foot under bandage. ? ? ?Assessment/Plan: ? ?Necrotic/nonhealing right TMA stump/critical right limb ischemia:  Vascular surgery/orthopedics following-angiogram scheduled for Monday-subsequently will likely require AKA versus BKA based on angiogram results.  Continue empiric IV antibiotics for now and monitor.  Patient agreeable for blood transfusion during the perioperative period. ? ?PAF: Rate controlled-continue metoprolol-Eliquis on hold  for possible angiogram Monday-in the interim-we will start IV heparin.  CHA2DS2-VASc of at least 4. ? ?PAD-s/p right common iliac artery stent June 2022-angioplasty of distal right SFA: Vascular surgery following-angiogram/aortogram scheduled for Monday. ? ?CAD s/p PCI to RCA 2016: No anginal symptoms-continue anticoagulation-suspect not on antiplatelets is on anticoagulation. ? ?DM-2 (A1c 7.0 on 5/5): CBGs relatively stable-continue 5 units of Semglee and SSI.  Follow and adjust. ? ?Recent Labs  ?  04/01/22 ?0315 04/01/22 ?0423 04/01/22 ?0916  ?GLUCAP 170* 153* 300*  ?  ?Peripheral neuropathy: Continue Neurontin/Cymbalta ? ?CKD stage IIIa: Creatinine close to baseline-follow periodically. ? ?Normocytic anemia: Mild-likely due to combination of acute illness/CKD-follow CBC periodically. ? ?Pressure Ulcer: ?Pressure Injury 03/30/22 Coccyx Stage 2 -  Partial thickness loss of dermis presenting as a shallow open injury with a red, pink wound bed without slough. (Active)  ?03/30/22 2200  ?Location: Coccyx  ?Location Orientation:   ?Staging: Stage 2 -  Partial thickness loss of dermis presenting as a shallow open injury with a red, pink wound bed without slough.  ?Wound Description (Comments):   ?Present on Admission: Yes  ?Dressing Type Foam - Lift dressing to assess site every shift 03/31/22 2238  ? ?Obesity: ?Estimated body mass index is 30.08 kg/m? as calculated from the following: ?  Height as of this encounter: 5\' 9"  (1.753 m). ?  Weight as of this encounter: 92.4 kg.  ? ?Code status: ?  Code Status: Full Code  ? ?DVT Prophylaxis ?Place and maintain sequential compression device Start: 03/29/22 2235 ?Starting IV heparin 5/6. ?  ?Family Communication: She prefers to update family herself-I have asked  her to let us know if she changes her mind or if her family members have questions. ? ? ?Disposition Plan: ?Status is: Inpatient ?Remains inpatient appropriate because: Necrotic right TMA stump/critical limb  ischemia-not stable for discharge. ?  ?Planned Discharge Destination:Home health ? ? ?Diet: ?Diet Order   ? ?       ?  Diet NPO time specified  Diet effective midnight       ?  ?  Diet renal/carb modified with fluid restriction Diet-HS Snack? Nothing; Fluid restriction: 1200 mL Fluid; Room service appropriate? Yes; Fluid consistency: Thin  Diet effective now       ?  ? ?  ?  ? ?  ?  ? ? ?MEDICATIONS: ?Scheduled Meds: ? DULoxetine  60 mg Oral BID  ? fesoterodine  8 mg Oral Daily  ? gabapentin  1,200 mg Oral BID  ? hydrOXYzine  75 mg Oral Daily  ? insulin aspart  0-9 Units Subcutaneous Q4H  ? insulin glargine-yfgn  5 Units Subcutaneous Daily  ? metoprolol succinate  50 mg Oral Daily  ? mirabegron ER  25 mg Oral Daily  ? pantoprazole  40 mg Oral Daily  ? polyethylene glycol  17 g Oral Daily  ? ?Continuous Infusions: ? aztreonam 2 g (04/01/22 0503)  ? heparin 1,850 Units/hr (04/01/22 0505)  ? metronidazole 500 mg (04/01/22 0956)  ? vancomycin 1,250 mg (03/31/22 2242)  ? ?PRN Meds:.acetaminophen **OR** acetaminophen, albuterol, fentaNYL (SUBLIMAZE) injection, ondansetron **OR** ondansetron (ZOFRAN) IV ? ? ?I have personally reviewed following labs and imaging studies ? ?LABORATORY DATA: ? ?Recent Labs  ?Lab 03/29/22 ?1427 03/30/22 ?0400 03/31/22 ?0031 04/01/22 ?0335  ?WBC 13.6* 12.3* 13.0* 11.6*  ?HGB 11.9* 11.1* 11.1* 10.6*  ?HCT 36.9 35.0* 34.3* 32.5*  ?PLT 266 229 222 193  ?MCV 90.2 92.1 90.7 90.0  ?MCH 29.1 29.2 29.4 29.4  ?MCHC 32.2 31.7 32.4 32.6  ?RDW 14.0 14.0 13.9 13.6  ?LYMPHSABS 2.5  --   --   --   ?MONOABS 1.1*  --   --   --   ?EOSABS 0.2  --   --   --   ?BASOSABS 0.1  --   --   --   ? ? ?Recent Labs  ?Lab 03/29/22 ?1427 03/29/22 ?2142 03/30/22 ?0400 03/31/22 ?0031 04/01/22 ?0335  ?NA 137  --  139 137 137  ?K 4.2  --  4.0 4.7 4.6  ?CL 110  --  110 108 108  ?CO2 20*  --  19* 24 21*  ?GLUCOSE 296*  --  203* 225* 175*  ?BUN 17  --  14 16 18   ?CREATININE 1.08*  --  0.92 1.05* 0.98  ?CALCIUM 8.6*  --  8.7* 8.7*  8.7*  ?AST 9*  --   --   --   --   ?ALT 10  --   --   --   --   ?ALKPHOS 89  --   --   --   --   ?BILITOT 0.5  --   --   --   --   ?ALBUMIN 2.6*  --   --   --   --   ?CRP  --  17.7* 18.0* 16.2*  --   ?LATICACIDVEN 1.5  --   --   --   --   ?HGBA1C  --   --  7.0*  --   --   ? ? ?  ?  ? ? ? ? ? ?RADIOLOGY STUDIES/RESULTS: ?  No results found. ? ? LOS: 3 days  ? ?Signature ? ?Lala Lund M.D on 04/01/2022 at 10:52 AM   -  To page go to [email or web address]  ? ?  ? ? ? ? ?

## 2022-04-01 NOTE — Progress Notes (Signed)
ANTICOAGULATION CONSULT NOTE  ?Pharmacy Consult for heparin ?Indication: atrial fibrillation ? ?Patient Measurements: ?Height: 5\' 9"  (175.3 cm) ?Weight: 92.4 kg (203 lb 11.3 oz) ?IBW/kg (Calculated) : 66.2 ?Heparin Dosing Weight: 86.4 kg ? ?Vital Signs: ?Temp: 98.1 ?F (36.7 ?C) (05/07 0900) ?Temp Source: Oral (05/07 0900) ?BP: 98/51 (05/07 0900) ?Pulse Rate: 73 (05/07 0900) ? ?Labs: ?Recent Labs  ?  03/30/22 ?0400 03/31/22 ?0031 03/31/22 ?1058 03/31/22 ?1828 04/01/22 ?0335  ?HGB 11.1* 11.1*  --   --  10.6*  ?HCT 35.0* 34.3*  --   --  32.5*  ?PLT 229 222  --   --  193  ?APTT  --   --  33 39*  --   ?HEPARINUNFRC  --   --  <0.10* <0.10* 0.13*  ?CREATININE 0.92 1.05*  --   --  0.98  ? ?Estimated Creatinine Clearance: 71.1 mL/min (by C-G formula based on SCr of 0.98 mg/dL). ? ?Assessment: ?61 y.o. female admitted on 5/4 for diabetic foot infection and PMH significant for Afib on PTA Eliquis. Unsure of last known dose, per patient report 3-4 days before admission. Pharmacy consulted to dose IV heparin. ? ?Heparin level 0.3 on heparin 1850 units/hr and s/p 2000 unit bolus 5/7 @ 0600  ? ?Goal of Therapy:  ?Heparin level 0.3-0.7 units/ml ?aPTT 66-102 seconds ?Monitor platelets by anticoagulation protocol: Yes ?  ?Plan:  ?Continue heparin IV 1850 units/hr  ?Daily cbc and heparin level ? ?Thank you for allowing pharmacy to participate in this patient's care. ? ?7/7, PharmD ?PGY1 Acute Care Resident  ?04/01/2022,1:10 PM ? ? ? ? ?

## 2022-04-01 NOTE — Progress Notes (Addendum)
OT Cancellation Note ? ?Patient Details ?Name: Kerry Randall ?MRN: [id number] ?DOB: [birth year] ? ? ?Cancelled Treatment:    Reason Eval/Treat Not Completed: Other (comment) (Pt with angiogram scheduled for 5/8, will evaluate post-amputation.) ? ?Alfonzo Beers, OTD, OTR/L ?Acute Rehab ?[phone] ? ?Kerry Randall ?04/01/2022, 5:12 PM ?

## 2022-04-02 ENCOUNTER — Encounter (HOSPITAL_COMMUNITY): Payer: Self-pay | Admitting: Vascular Surgery

## 2022-04-02 ENCOUNTER — Inpatient Hospital Stay (HOSPITAL_COMMUNITY)
Admission: EM | Disposition: A | Discharge status: Skilled Nursing Facility | Payer: Self-pay | Source: Home / Self Care | Attending: Internal Medicine

## 2022-04-02 DIAGNOSIS — L089 Local infection of the skin and subcutaneous tissue, unspecified: Secondary | ICD-10-CM | POA: Diagnosis not present

## 2022-04-02 DIAGNOSIS — E11628 Type 2 diabetes mellitus with other skin complications: Secondary | ICD-10-CM | POA: Diagnosis not present

## 2022-04-02 DIAGNOSIS — I70234 Atherosclerosis of native arteries of right leg with ulceration of heel and midfoot: Secondary | ICD-10-CM

## 2022-04-02 DIAGNOSIS — Z95820 Peripheral vascular angioplasty status with implants and grafts: Secondary | ICD-10-CM

## 2022-04-02 DIAGNOSIS — T82858A Stenosis of vascular prosthetic devices, implants and grafts, initial encounter: Secondary | ICD-10-CM

## 2022-04-02 HISTORY — PX: ABDOMINAL AORTOGRAM W/LOWER EXTREMITY: CATH118223

## 2022-04-02 HISTORY — PX: PERIPHERAL VASCULAR INTERVENTION: CATH118257

## 2022-04-02 LAB — CBC WITH DIFFERENTIAL/PLATELET
Abs Immature Granulocytes: 0.08 10*3/uL — ABNORMAL HIGH (ref 0.00–0.07)
Basophils Absolute: 0 10*3/uL (ref 0.0–0.1)
Basophils Relative: 0 %
Eosinophils Absolute: 0.4 10*3/uL (ref 0.0–0.5)
Eosinophils Relative: 4 %
HCT: 34.1 % — ABNORMAL LOW (ref 36.0–46.0)
Hemoglobin: 10.9 g/dL — ABNORMAL LOW (ref 12.0–15.0)
Immature Granulocytes: 1 %
Lymphocytes Relative: 19 %
Lymphs Abs: 1.7 10*3/uL (ref 0.7–4.0)
MCH: 28.8 pg (ref 26.0–34.0)
MCHC: 32 g/dL (ref 30.0–36.0)
MCV: 90.2 fL (ref 80.0–100.0)
Monocytes Absolute: 0.8 10*3/uL (ref 0.1–1.0)
Monocytes Relative: 9 %
Neutro Abs: 5.9 10*3/uL (ref 1.7–7.7)
Neutrophils Relative %: 67 %
Platelets: 192 10*3/uL (ref 150–400)
RBC: 3.78 MIL/uL — ABNORMAL LOW (ref 3.87–5.11)
RDW: 13.6 % (ref 11.5–15.5)
WBC: 8.8 10*3/uL (ref 4.0–10.5)
nRBC: 0 % (ref 0.0–0.2)

## 2022-04-02 LAB — COMPREHENSIVE METABOLIC PANEL
ALT: 9 U/L (ref 0–44)
AST: 11 U/L — ABNORMAL LOW (ref 15–41)
Albumin: 2.2 g/dL — ABNORMAL LOW (ref 3.5–5.0)
Alkaline Phosphatase: 84 U/L (ref 38–126)
Anion gap: 4 — ABNORMAL LOW (ref 5–15)
BUN: 21 mg/dL (ref 8–23)
CO2: 23 mmol/L (ref 22–32)
Calcium: 8.8 mg/dL — ABNORMAL LOW (ref 8.9–10.3)
Chloride: 111 mmol/L (ref 98–111)
Creatinine, Ser: 1 mg/dL (ref 0.44–1.00)
GFR, Estimated: >60 mL/min (ref >60)
Glucose, Bld: 168 mg/dL — ABNORMAL HIGH (ref 70–99)
Potassium: 3.7 mmol/L (ref 3.5–5.1)
Sodium: 138 mmol/L (ref 135–145)
Total Bilirubin: 0.2 mg/dL — ABNORMAL LOW (ref 0.3–1.2)
Total Protein: 6.4 g/dL — ABNORMAL LOW (ref 6.5–8.1)

## 2022-04-02 LAB — GLUCOSE, CAPILLARY
Glucose-Capillary: 160 mg/dL — ABNORMAL HIGH (ref 70–99)
Glucose-Capillary: 114 mg/dL — ABNORMAL HIGH (ref 70–99)
Glucose-Capillary: 330 mg/dL — ABNORMAL HIGH (ref 70–99)
Glucose-Capillary: 363 mg/dL — ABNORMAL HIGH (ref 70–99)

## 2022-04-02 LAB — TYPE AND SCREEN
ABO/RH(D): A POS
Antibody Screen: NEGATIVE

## 2022-04-02 LAB — HEPARIN LEVEL (UNFRACTIONATED): Heparin Unfractionated: 0.33 IU/mL (ref 0.30–0.70)

## 2022-04-02 LAB — BRAIN NATRIURETIC PEPTIDE: B Natriuretic Peptide: 243.6 pg/mL — ABNORMAL HIGH (ref 0.0–100.0)

## 2022-04-02 LAB — MAGNESIUM: Magnesium: 1.8 mg/dL (ref 1.7–2.4)

## 2022-04-02 SURGERY — ABDOMINAL AORTOGRAM W/LOWER EXTREMITY
Anesthesia: LOCAL | Laterality: Right

## 2022-04-02 MED ORDER — METHYLPREDNISOLONE SODIUM SUCC 125 MG IJ SOLR
125.0000 mg | Freq: Once | INTRAMUSCULAR | Status: AC
  Administered 2022-04-02: 125 mg via INTRAVENOUS
  Filled 2022-04-02: qty 2

## 2022-04-02 MED ORDER — HEPARIN SODIUM (PORCINE) 1000 UNIT/ML IJ SOLN
INTRAMUSCULAR | Status: AC
  Filled 2022-04-02: qty 10

## 2022-04-02 MED ORDER — HEPARIN (PORCINE) IN NACL 1000-0.9 UT/500ML-% IV SOLN
INTRAVENOUS | Status: AC
Start: 2022-04-02 — End: ?
  Filled 2022-04-02: qty 1000

## 2022-04-02 MED ORDER — FENTANYL CITRATE (PF) 100 MCG/2ML IJ SOLN
INTRAMUSCULAR | Status: DC | PRN
  Administered 2022-04-02 (×2): 50 ug via INTRAVENOUS

## 2022-04-02 MED ORDER — HEPARIN (PORCINE) 25000 UT/250ML-% IV SOLN
1950.0000 [IU]/h | INTRAVENOUS | Status: DC
  Administered 2022-04-02: 1850 [IU]/h via INTRAVENOUS
  Administered 2022-04-03 – 2022-04-04 (×2): 1950 [IU]/h via INTRAVENOUS
  Filled 2022-04-02 (×4): qty 250

## 2022-04-02 MED ORDER — ASPIRIN EC 81 MG PO TBEC
81.0000 mg | DELAYED_RELEASE_TABLET | Freq: Every day | ORAL | Status: DC
  Administered 2022-04-02 – 2022-04-07 (×6): 81 mg via ORAL
  Filled 2022-04-02 (×6): qty 1

## 2022-04-02 MED ORDER — HEPARIN (PORCINE) IN NACL 1000-0.9 UT/500ML-% IV SOLN
INTRAVENOUS | Status: DC | PRN
  Administered 2022-04-02 (×2): 500 mL

## 2022-04-02 MED ORDER — SODIUM CHLORIDE 0.9 % IV SOLN: 250.0000 mL | INTRAVENOUS | Status: DC | PRN

## 2022-04-02 MED ORDER — SODIUM CHLORIDE 0.9 % IV SOLN: INTRAVENOUS | Status: DC

## 2022-04-02 MED ORDER — ONDANSETRON HCL 4 MG/2ML IJ SOLN: 4.0000 mg | Freq: Four times a day (QID) | INTRAMUSCULAR | Status: DC | PRN

## 2022-04-02 MED ORDER — MIDAZOLAM HCL 2 MG/2ML IJ SOLN
INTRAMUSCULAR | Status: DC | PRN
  Administered 2022-04-02: 1 mg via INTRAVENOUS

## 2022-04-02 MED ORDER — IODIXANOL 320 MG/ML IV SOLN
INTRAVENOUS | Status: DC | PRN
  Administered 2022-04-02: 130 mL via INTRA_ARTERIAL

## 2022-04-02 MED ORDER — FENTANYL CITRATE (PF) 100 MCG/2ML IJ SOLN
INTRAMUSCULAR | Status: AC
  Filled 2022-04-02: qty 2

## 2022-04-02 MED ORDER — SODIUM CHLORIDE 0.9 % IV SOLN
INTRAVENOUS | Status: DC
Start: 2022-04-02 — End: 2022-04-02

## 2022-04-02 MED ORDER — INSULIN GLARGINE-YFGN 100 UNIT/ML ~~LOC~~ SOLN
5.0000 [IU] | Freq: Every day | SUBCUTANEOUS | Status: DC
  Administered 2022-04-02 – 2022-04-03 (×2): 5 [IU] via SUBCUTANEOUS
  Filled 2022-04-02 (×2): qty 0.05

## 2022-04-02 MED ORDER — INSULIN ASPART 100 UNIT/ML IJ SOLN
0.0000 [IU] | Freq: Three times a day (TID) | INTRAMUSCULAR | Status: DC
  Administered 2022-04-02: 9 [IU] via SUBCUTANEOUS

## 2022-04-02 MED ORDER — LIDOCAINE HCL (PF) 1 % IJ SOLN
INTRAMUSCULAR | Status: DC | PRN
  Administered 2022-04-02 (×2): 15 mL via INTRADERMAL

## 2022-04-02 MED ORDER — SODIUM CHLORIDE 0.9 % WEIGHT BASED INFUSION: 1.0000 mL/kg/h | INTRAVENOUS | Status: AC

## 2022-04-02 MED ORDER — MIDAZOLAM HCL 5 MG/5ML IJ SOLN
INTRAMUSCULAR | Status: AC
  Filled 2022-04-02: qty 5

## 2022-04-02 MED ORDER — LIDOCAINE HCL (PF) 1 % IJ SOLN
INTRAMUSCULAR | Status: AC
Start: 2022-04-02 — End: ?
  Filled 2022-04-02: qty 30

## 2022-04-02 MED ORDER — HEPARIN SODIUM (PORCINE) 1000 UNIT/ML IJ SOLN
INTRAMUSCULAR | Status: DC | PRN
Start: 2022-04-02 — End: 2022-04-02
  Administered 2022-04-02: 10000 [IU] via INTRAVENOUS

## 2022-04-02 MED ORDER — SODIUM CHLORIDE 0.9% FLUSH: 3.0000 mL | INTRAVENOUS | Status: DC | PRN

## 2022-04-02 MED ORDER — LABETALOL HCL 5 MG/ML IV SOLN: 10.0000 mg | INTRAVENOUS | Status: DC | PRN

## 2022-04-02 MED ORDER — ACETAMINOPHEN 325 MG PO TABS
650.0000 mg | ORAL_TABLET | ORAL | Status: DC | PRN
  Administered 2022-04-06: 650 mg via ORAL

## 2022-04-02 MED ORDER — ATORVASTATIN CALCIUM 40 MG PO TABS
40.0000 mg | ORAL_TABLET | Freq: Every day | ORAL | Status: DC
  Administered 2022-04-02 – 2022-04-07 (×6): 40 mg via ORAL
  Filled 2022-04-02 (×6): qty 1

## 2022-04-02 MED ORDER — HYDRALAZINE HCL 20 MG/ML IJ SOLN: 5.0000 mg | INTRAMUSCULAR | Status: DC | PRN

## 2022-04-02 MED ORDER — DIPHENHYDRAMINE HCL 50 MG/ML IJ SOLN
25.0000 mg | Freq: Once | INTRAMUSCULAR | Status: AC
  Administered 2022-04-02: 25 mg via INTRAVENOUS
  Filled 2022-04-02: qty 1

## 2022-04-02 MED ORDER — SODIUM CHLORIDE 0.9% FLUSH
3.0000 mL | Freq: Two times a day (BID) | INTRAVENOUS | Status: DC
  Administered 2022-04-03 – 2022-04-07 (×7): 3 mL via INTRAVENOUS

## 2022-04-02 SURGICAL SUPPLY — 19 items
BALLN MUSTANG 7X60X75 (BALLOONS) ×6
BALLOON MUSTANG 7X60X75 (BALLOONS) IMPLANT
CATH OMNI FLUSH 5F 65CM (CATHETERS) ×2 IMPLANT
CLOSURE PERCLOSE PROSTYLE (VASCULAR PRODUCTS) ×2 IMPLANT
GLIDEWIRE ADV .035X260CM (WIRE) ×1 IMPLANT
GLIDEWIRE NITREX 0.018X80X5 (WIRE) ×1
GUIDEWIRE NITREX 0.018X80X5 (WIRE) IMPLANT
KIT MICROPUNCTURE NIT STIFF (SHEATH) ×2 IMPLANT
KIT PV (KITS) ×3 IMPLANT
SHEATH BRITE TIP 7FR 35CM (SHEATH) ×2 IMPLANT
SHEATH PINNACLE 5F 10CM (SHEATH) ×1 IMPLANT
SHEATH PROBE COVER 6X72 (BAG) ×1 IMPLANT
STENT VIABAHN VBX 7X59X80 (Permanent Stent) ×2 IMPLANT
SYR MEDRAD MARK 7 150ML (SYRINGE) ×3 IMPLANT
TRANSDUCER W/STOPCOCK (MISCELLANEOUS) ×3 IMPLANT
TRAY PV CATH (CUSTOM PROCEDURE TRAY) ×3 IMPLANT
WIRE BENTSON .035X145CM (WIRE) ×1 IMPLANT
WIRE MICROINTRODUCER 60CM (WIRE) ×2 IMPLANT
WIRE ROSEN-J .035X180CM (WIRE) ×1 IMPLANT

## 2022-04-02 NOTE — Progress Notes (Signed)
[facility] [place] ?PROGRESS NOTE ? ?ASSESSMENT / PLAN: ?Kerry Randall is a 61 y.o. female with non-salvageable tissue loss of the right lower extremity. Will need major amputation. Absent right femoral pulse. Plan angiogram via left common femoral artery approach today in cath lab.  ? ?SUBJECTIVE: ?No interval events. All questions answered. ? ?OBJECTIVE: ?BP 121/62 (BP Location: Left Arm)   Pulse 72   Temp 98.4 ?F (36.9 ?C) (Oral)   Resp 18   Ht 5\' 9"  (1.753 m)   Wt 92.4 kg   SpO2 92%   BMI 30.08 kg/m?  ? ?Intake/Output Summary (Last 24 hours) at 04/02/2022 0918 ?Last data filed at 04/02/2022 0219 ?Gross per 24 hour  ?Intake 240 ml  ?Output 4 ml  ?Net 236 ml  ?  ?No acute distress ?Regular rate and rhythm ?Unlabored breathing ?Unchanged appearance of right foot ? ? ?  Latest Ref Rng & Units 04/02/2022  ?  1:56 AM 04/01/2022  ?  3:35 AM 03/31/2022  ? 12:31 AM  ?CBC  ?WBC 4.0 - 10.5 K/uL 8.8   11.6   13.0    ?Hemoglobin 12.0 - 15.0 g/dL 05/31/2022   31.4   97.0    ?Hematocrit 36.0 - 46.0 % 34.1   32.5   34.3    ?Platelets 150 - 400 K/uL 192   193   222    ?  ? ? ?  Latest Ref Rng & Units 04/02/2022  ?  1:56 AM 04/01/2022  ?  3:35 AM 03/31/2022  ? 12:31 AM  ?CMP  ?Glucose 70 - 99 mg/dL 05/31/2022   785   885    ?BUN 8 - 23 mg/dL 21   18   16     ?Creatinine 0.44 - 1.00 mg/dL 027     7.41    ?Sodium 135 - 145 mmol/L 138   137   137    ?Potassium 3.5 - 5.1 mmol/L 3.7   4.6   4.7    ?Chloride 98 - 111 mmol/L 111   108   108    ?CO2 22 - 32 mmol/L 23   21   24     ?Calcium 8.9 - 10.3 mg/dL 8.8   8.7   8.7    ?Total Protein 6.5 - 8.1 g/dL 6.4      ?Total Bilirubin 0.3 - 1.2 mg/dL 0.2      ?Alkaline Phos 38 - 126 U/L 84      ?AST 15 - 41 U/L 11      ?ALT 0 - 44 U/L 9      ? ? ?Estimated Creatinine Clearance: 69.7 mL/min (by C-G formula based on SCr of 1 mg/dL). ? ? ?2.87. 8.67, MD ?[facility] [place] ?Office Phone Number: [phone] ?04/02/2022 9:18 AM ? ? ? ?

## 2022-04-02 NOTE — Progress Notes (Signed)
OT Cancellation Note ? ?Patient Details ?Name: Kerry Randall ?MRN: [id number] ?DOB: 1960-12-20 ? ? ?Cancelled Treatment:    Reason Eval/Treat Not Completed: Patient at procedure or test/ unavailable. Will continue to follow.  ? ?Malka So ?04/02/2022, 9:25 AM ?Nestor Lewandowsky, OTR/L ?Acute Rehabilitation Services ?Pager: [phone] ?Office: [phone]  ?

## 2022-04-02 NOTE — Care Management Important Message (Signed)
Important Message ? ?Patient Details  ?Name: Kerry Randall ?MRN: [id number] ?Date of Birth: Mar 07, 1961 ? ? ?Medicare Important Message Given:  Yes ?Patient left prior to IM delivery will mail to the patient home address.  ? ? ? ?Shiesha Jahn ?04/02/2022, 2:48 PM ?

## 2022-04-02 NOTE — Op Note (Addendum)
DATE OF SERVICE: 04/02/2022 ? ?PATIENT:  Kerry Randall  61 y.o. female ? ?PRE-OPERATIVE DIAGNOSIS:  Atherosclerosis of native arteries of right lower extremity causing ulceration ? ?POST-OPERATIVE DIAGNOSIS:  Occlusion of right common iliac artery stent ? ?PROCEDURE:   ?1) ultrasound guided bilateral common femoral artery access ?2) Aortogram ?3) Bilateral common iliac artery stenting (7x55mm VBX x2) ?4) Right lower extremity angiogram (148mL total contrast) ?5) Conscious sedation (89 minutes) ? ?SURGEON:  Yevonne Aline. Stanford Breed, MD ? ?ASSISTANT: none ? ?ANESTHESIA:   local and IV sedation ? ?ESTIMATED BLOOD LOSS: minimal ? ?LOCAL MEDICATIONS USED:  LIDOCAINE  ? ?COUNTS: confirmed correct. ? ?PATIENT DISPOSITION:  PACU - hemodynamically stable. ?  ?Delay start of Pharmacological VTE agent (>24hrs) due to surgical blood loss or risk of bleeding: no ? ?INDICATION FOR PROCEDURE: Kerry Randall is a 61 y.o. female with breakdown of right transmetatarsal amputation, done elsewhere. Patient was referred to [facility] ER for major amputation. Orthopedics astutely recommended formal vascular evaluation prior to major amputation. After careful discussion of risks, benefits, and alternatives the patient was offered angiography. The patient understood and wished to proceed. ? ?OPERATIVE FINDINGS:  ?Terminal aorta and iliac arteries: ?Occluded right common iliac artery stent ?Moderate (~50%) left distal common iliac artery stenosis ?No other occlusion or signficiant stenosis in terminal aorta or iliac arteries ? ?Right lower extremity: ?Common femoral artery: widely patent without significant stenosis  ?Profunda femoris artery: widely patent without significant stenosis   ?Superficial femoral artery: widely patent without significant stenosis  ?Popliteal artery: widely patent without significant stenosis  ?Anterior tibial artery: widely patent without significant stenosis  ?Tibioperoneal trunk: widely patent without significant  stenosis  ?Peroneal artery: widely patent without significant stenosis  ?Posterior tibial artery: widely patent without significant stenosis  ?Pedal circulation: intact pedal arch ? ?GLASS score: not applicable - no significant FP or TP disease.  ? ?WIfI score. No ABI available.  ? ?DESCRIPTION OF PROCEDURE: After identification of the patient in the pre-operative holding area, the patient was transferred to the operating room. The patient was positioned supine on the operating room table. Anesthesia was induced. The groins was prepped and draped in standard fashion. A surgical pause was performed confirming correct patient, procedure, and operative location. ? ?The bilateral groins were anesthetized with subcutaneous injection of 1% lidocaine. Using ultrasound guidance, the bilateral common femoral arteries were accessed with micropuncture technique. Fluoroscopy was used to confirm cannulation over the femoral head. The 30F sheath was upsized to 26F bilaterally.  ? ?A Bentson wire was easily navigated into the terminal aorta through the left-sided access.  An Omni Flush catheter was navigated over the wire into the terminal aorta.  An aortogram was performed.  This revealed an occluded right common iliac artery stent.  A Glidewire advantage was used to cross the occluded right common iliac artery stent on the right.  This was done without much difficulty from a retrograde approach. ? ?The Bentson wire was exchanged for a Glidewire advantage on the left.  Both sheaths were upsized to a 75F x 35 mm Brite tip sheath.  The sheaths were delivered across the origin of the common iliac artery into the terminal aorta. ? ?The patient was systemically heparinized with 10,000 units of IV heparin. ? ?Dr. Fletcher Anon had previously placed a 7 mm right common iliac artery bare-metal stent.  Elected to use a similarly sized covered balloon expandable stent.  7 x 59 mm VBX stents were brought on the field and delivered into the  sheath  across the lesions.  The 2 stents were placed in a kissing configuration.  The sheaths were withdrawn into the external iliac arteries bilaterally.  The stents were deployed in standard fashion. ? ?Both balloons were withdrawn.  To the left-sided access and Omni Flush catheter was delivered into the terminal aorta.  An additional aortogram and pelvic angiogram was performed.  Good technical result was noted from restenting.  Inline flow was achieved into the common femoral artery.  The right-sided stent did seem a bit undersized.  I performed angioplasty of this stents bilaterally and simultaneously using a 7 x 60 mm Mustang balloons.  Retrograde angiogram showed better result after this angioplasty. ? ?We then performed right lower extremity angiography in stations using the indwelling right common femoral artery sheath.  No hemodynamically significant stenosis was noted.  Inline flow was noted to the foot.  The pedal arch appeared intact. ? ?Perclose devices were used to close the arteriotomies bilaterally. Hemostasis was excellent upon completion. ? ?Conscious sedation was administered with the use of IV fentanyl and midazolam under continuous physician and nurse monitoring.  Heart rate, blood pressure, and oxygen saturation were continuously monitored.  Total sedation time was 89 minutes ? ?Upon completion of the case instrument and sharps counts were confirmed correct. The patient was transferred to the PACU in good condition. I was present for all portions of the procedure. ? ?PLAN: ASA 81mg  PO QD indefinitely. Resume anticoagulation with heparin. High intensity statin therapy. Patient is optimized from a vascular standpoint. Cleared to proceed with further debridement or major amputation. Will reach out to orthopedics.  ? ?Yevonne Aline. Stanford Breed, MD ?[facility] [place] ?Office Phone Number: [phone] ?04/02/2022 11:23 AM ? ?

## 2022-04-02 NOTE — Progress Notes (Signed)
ANTICOAGULATION CONSULT NOTE  ?Pharmacy Consult for heparin ?Indication: atrial fibrillation ? ?Patient Measurements: ?Height: 5\' 9"  (175.3 cm) ?Weight: 92.4 kg (203 lb 11.3 oz) ?IBW/kg (Calculated) : 66.2 ?Heparin Dosing Weight: 86.4 kg ? ?Vital Signs: ?Temp: 98.4 ?F (36.9 ?C) (05/08 0745) ?Temp Source: Oral (05/08 0745) ?BP: 121/62 (05/08 0745) ?Pulse Rate: 72 (05/08 0745) ? ?Labs: ?Recent Labs  ?  03/31/22 ?0031 03/31/22 ?1058 03/31/22 ?1058 03/31/22 ?1828 04/01/22 ?0335 04/01/22 ?1055 04/02/22 ?0156  ?HGB 11.1*  --   --   --  10.6*  --  10.9*  ?HCT 34.3*  --   --   --  32.5*  --  34.1*  ?PLT 222  --   --   --  193  --  192  ?APTT  --  33  --  39*  --   --   --   ?HEPARINUNFRC  --  <0.10*   < > <0.10* 0.13* 0.30 0.33  ?CREATININE 1.05*  --   --   --  0.98  --  1.00  ? < > = values in this interval not displayed.  ? ?Estimated Creatinine Clearance: 69.7 mL/min (by C-G formula based on SCr of 1 mg/dL). ? ?Assessment: ?61 y.o. female admitted on 5/4 for diabetic foot infection and PMH significant for Afib on PTA Eliquis. Unsure of last known dose, per patient report 3-4 days before admission. Pharmacy consulted to dose IV heparin. ? ?Heparin level 0.33 on heparin 1850 units/hr ? ?Goal of Therapy:  ?Heparin level 0.3-0.7 units/ml ?aPTT 66-102 seconds ?Monitor platelets by anticoagulation protocol: Yes ?  ?Plan:  ?Continue heparin IV 1850 units/hr  ?Daily cbc and heparin level ? ?Kayte Borchard A. 7/4, PharmD, BCPS, FNKF ?Clinical Pharmacist ?[place] ?Please utilize Amion for appropriate phone number to reach the unit pharmacist [facility]) ? ?04/02/2022,7:49 AM ? ? ? ? ?

## 2022-04-02 NOTE — Progress Notes (Signed)
Inpatient Diabetes Program Recommendations ? ?AACE/ADA: New Consensus Statement on Inpatient Glycemic Control  ? ?Target Ranges:  Prepandial:   less than 140 mg/dL ?     Peak postprandial:   less than 180 mg/dL (1-2 hours) ?     Critically ill patients:  140 - 180 mg/dL  ? ? Latest Reference Range & Units 04/02/22 04:15 04/02/22 08:22  ?Glucose-Capillary 70 - 99 mg/dL 025 (H) 852 (H)  ? ? Latest Reference Range & Units 04/01/22 04:23 04/01/22 09:16 04/01/22 13:36 04/01/22 17:51 04/01/22 19:22 04/01/22 23:08  ?Glucose-Capillary 70 - 99 mg/dL 778 (H) 242 (H) 353 (H) 163 (H) 190 (H) 265 (H)  ? ?Review of Glycemic Control ? ?Diabetes history: DM2 ?Outpatient Diabetes medications: Trulicity 3 mg Qweek (Tuesday), Lantus 17 units daily, Metformin 1000 mg BID (not taking per med list) ?Current orders for Inpatient glycemic control: Semglee 5 units daily, Novolog 0-9 units Q4H ? ?Inpatient Diabetes Program Recommendations:   ? ?Insulin: Patient has received a total of Novolog 18 units for correction over past 24 hours. Noted patient received Solumedrol 125 mg x1 today at 8:22 am which is likely contribute to hyperglycemia. May want to consider increasing Semglee. ? ?Thanks, ?Orlando Penner, RN, MSN, CDE ?Diabetes Coordinator ?Inpatient Diabetes Program ?[phone] (Team Pager from 8am to 5pm)' ? ? ? ?

## 2022-04-02 NOTE — Progress Notes (Signed)
PT Cancellation Note ? ?Patient Details ?Name: Kerry Randall ?MRN: [id number] ?DOB: [birth year] ? ? ?Cancelled Treatment:    Reason Eval/Treat Not Completed: Patient at procedure or test/unavailable ? ?Patient down for angiogram. Will defer PT evaluation until after procedure/?surgery. ? ? ?Arby Barrette, PT ?Acute Rehabilitation Services  ?Pager [phone] ?Office [phone] ? ? ?Jeanie Cooks [name] ?04/02/2022, 10:35 AM ?

## 2022-04-02 NOTE — Progress Notes (Signed)
?      ?                 PROGRESS NOTE ? ?      ?PATIENT DETAILS ?Name: Kerry Randall ?Age: 61 y.o. ?Sex: female ?Date of Birth: 02-01-1961 ?Admit Date: 03/29/2022 ?Admitting Physician Vianne Bulls, MD ?[id number], [place] ? ?Brief Summary: ?Patient is a 61 y.o.  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  See below for further details. ? ?Significant events: ?5/4>> admit to [facility] for necrotic right TMA stump. ? 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 ? ?Significant microbiology data: ?5/4>> COVID/influenza PCR: Negative ?5/4>> blood culture: No growth ? ?Procedures: ?None ? ?Consults: ?Orthopedics, vascular surgery ? ?Subjective:  Patient in bed, appears comfortable, denies any headache, no fever, no chest pain or pressure, no shortness of breath , no abdominal pain. No new focal weakness. ? ? ? ?Objective: ?Vitals: ?Blood pressure 121/62, pulse 72, temperature 98.4 ?F (36.9 ?C), temperature source Oral, resp. rate 18, height 5\' 9"  (1.753 m), weight 92.4 kg, SpO2 92 %.  ? ?Exam: ? ?Awake Alert, No new F.N deficits, Normal affect ?[place].AT,PERRAL ?Supple Neck, No JVD,   ?Symmetrical Chest wall movement, Good air movement bilaterally, CTAB ?RRR,No Gallops, Rubs or new Murmurs,  ?+ve B.Sounds, Abd Soft, No tenderness,    ?Right foot under bandage. ? ? ?Assessment/Plan: ? ?Necrotic/nonhealing right TMA stump/critical right limb ischemia:  Vascular surgery/orthopedics following-he is going for a diagnostic angiogram on 04/02/2022 followed by possible AKA versus BKA.  Continue empiric IV antibiotics for now and monitor.  Patient agreeable for blood transfusion during the perioperative period. ? ?PAF: Rate controlled-continue metoprolol-Eliquis on hold for possible  angiogram Monday-in the interim-we will start IV heparin.  CHA2DS2-VASc of at least 4. ? ?PAD-s/p right common iliac artery stent June 2022-angioplasty of distal right SFA: Vascular surgery following-angiogram/aortogram scheduled for Monday. ? ?CAD s/p PCI to RCA 2016: No anginal symptoms-continue anticoagulation-suspect not on antiplatelets is on anticoagulation. ? ?Peripheral neuropathy: Continue Neurontin/Cymbalta ? ?CKD stage IIIa: Creatinine close to baseline-follow periodically. ? ?Normocytic anemia: Mild-likely due to combination of acute illness/CKD-follow CBC periodically. ? ?DM-2 (A1c 7.0 on 5/5): CBGs relatively stable-continue 5 units of Semglee and SSI.  Follow and adjust. ? ?Recent Labs  ?  04/01/22 ?2308 04/02/22 ?0415 04/02/22 ?0822  ?GLUCAP 265* 160* 114*  ? ? ?Pressure Ulcer: ?Pressure Injury 03/30/22 Coccyx Stage 2 -  Partial thickness loss of dermis presenting as a shallow open injury with a red, pink wound bed without slough. (Active)  ?03/30/22 2200  ?Location: Coccyx  ?Location Orientation:   ?Staging: Stage 2 -  Partial thickness loss of dermis presenting as a shallow open injury with a red, pink wound bed without slough.  ?Wound Description (Comments):   ?Present on Admission: Yes  ?Dressing Type Foam - Lift dressing to assess site every shift 04/01/22 2219  ? ?Obesity: ?Estimated body mass index is 30.08 kg/m? as calculated from the following: ?  Height as of this encounter: 5\' 9"  (1.753 m). ?  Weight as of this encounter: 92.4 kg.  ? ?Code status: ?  Code Status: Full Code  ? ?DVT Prophylaxis ?Place and maintain sequential compression device Start: 03/29/22 2235 ?Starting IV heparin 5/6. ?  ?Family Communication: She prefers to update family herself-I  have asked her to let us know if she changes her mind or if her family members have questions. ? ? ?Disposition Plan: ?Status is: Inpatient ?Remains inpatient appropriate because: Necrotic right TMA stump/critical limb ischemia-not stable for  discharge. ?  ?Planned Discharge Destination:Home health ? ? ?Diet: ?Diet Order   ? ?       ?  Diet NPO time specified  Diet effective midnight       ?  ? ?  ?  ? ?  ?  ? ? ?MEDICATIONS: ?Scheduled Meds: ? [MAR Hold] DULoxetine  60 mg Oral BID  ? [MAR Hold] feeding supplement  237 mL Oral BID BM  ? [MAR Hold] fesoterodine  8 mg Oral Daily  ? [MAR Hold] gabapentin  1,200 mg Oral BID  ? [MAR Hold] hydrOXYzine  75 mg Oral Daily  ? [MAR Hold] insulin aspart  0-9 Units Subcutaneous Q4H  ? [MAR Hold] insulin glargine-yfgn  5 Units Subcutaneous QHS  ? [MAR Hold] metoprolol succinate  25 mg Oral Daily  ? [MAR Hold] mirabegron ER  25 mg Oral Daily  ? [MAR Hold] nutrition supplement (JUVEN)  1 packet Oral BID BM  ? [MAR Hold] pantoprazole  40 mg Oral Daily  ? [MAR Hold] polyethylene glycol  17 g Oral Daily  ? ?Continuous Infusions: ? sodium chloride 100 mL/hr at 04/02/22 [id number]  ? [MAR Hold] aztreonam 2 g (04/02/22 0518)  ? heparin 1,850 Units/hr (04/02/22 0654)  ? [MAR Hold] metronidazole 500 mg (04/02/22 0509)  ? [MAR Hold] vancomycin 1,250 mg (04/01/22 2226)  ? ?PRN Meds:.[MAR Hold] acetaminophen **OR** [MAR Hold] acetaminophen, [MAR Hold] albuterol, [MAR Hold] fentaNYL (SUBLIMAZE) injection, [MAR Hold] ondansetron **OR** [MAR Hold] ondansetron (ZOFRAN) IV ? ? ?I have personally reviewed following labs and imaging studies ? ?LABORATORY DATA: ? ?Recent Labs  ?Lab 03/29/22 ?1427 03/30/22 ?0400 03/31/22 ?0031 04/01/22 ?0335 04/02/22 ?[id number]  ?WBC 13.6* 12.3* 13.0* 11.6* 8.8  ?HGB 11.9* 11.1* 11.1* 10.6* 10.9*  ?HCT 36.9 35.0* 34.3* 32.5* 34.1*  ?PLT 266 229 222 193 192  ?MCV 90.2 92.1 90.7 90.0 90.2  ?MCH 29.1 29.2 29.4 29.4 28.8  ?MCHC 32.2 31.7 32.4 32.6 32.0  ?RDW 14.0 14.0 13.9 13.6 13.6  ?LYMPHSABS 2.5  --   --   --  1.7  ?MONOABS 1.1*  --   --   --  0.8  ?EOSABS 0.2  --   --   --  0.4  ?BASOSABS 0.1  --   --   --  0.0  ? ? ?Recent Labs  ?Lab 03/29/22 ?1427 03/29/22 ?2142 03/30/22 ?0400 03/31/22 ?0031 04/01/22 ?0335  04/02/22 ?[id number]  ?NA 137  --  139 137 137 138  ?K 4.2  --  4.0 4.7 4.6 3.7  ?CL 110  --  110 108 108 111  ?CO2 20*  --  19* 24 21* 23  ?GLUCOSE 296*  --  203* 225* 175* 168*  ?BUN 17  --  14 16 18 21   ?CREATININE 1.08*  --  0.92 1.05* 0.98 1.00  ?CALCIUM 8.6*  --  8.7* 8.7* 8.7* 8.8*  ?AST 9*  --   --   --   --  11*  ?ALT 10  --   --   --   --  9  ?ALKPHOS 89  --   --   --   --  84  ?BILITOT 0.5  --   --   --   --  0.2*  ?ALBUMIN 2.6*  --   --   --   --  2.2*  ?MG  --   --   --   --   --  1.8  ?CRP  --  17.7* 18.0* 16.2*  --   --   ?LATICACIDVEN 1.5  --   --   --   --   --   ?HGBA1C  --   --  7.0*  --   --   --   ?BNP  --   --   --   --   --  243.6*  ? ?RADIOLOGY STUDIES/RESULTS: ?No results found. ? ? LOS: 4 days  ? ?Signature ? ?Lala Lund M.D on 04/02/2022 at 9:23 AM   -  To page go to [email or web address]  ? ?  ? ? ? ? ?

## 2022-04-03 LAB — COMPREHENSIVE METABOLIC PANEL
ALT: 11 U/L (ref 0–44)
AST: 10 U/L — ABNORMAL LOW (ref 15–41)
Albumin: 2.1 g/dL — ABNORMAL LOW (ref 3.5–5.0)
Alkaline Phosphatase: 76 U/L (ref 38–126)
Anion gap: 6 (ref 5–15)
BUN: 24 mg/dL — ABNORMAL HIGH (ref 8–23)
CO2: 20 mmol/L — ABNORMAL LOW (ref 22–32)
Calcium: 8.5 mg/dL — ABNORMAL LOW (ref 8.9–10.3)
Chloride: 110 mmol/L (ref 98–111)
Creatinine, Ser: 0.96 mg/dL (ref 0.44–1.00)
GFR, Estimated: >60 mL/min (ref >60)
Glucose, Bld: 304 mg/dL — ABNORMAL HIGH (ref 70–99)
Potassium: 4.1 mmol/L (ref 3.5–5.1)
Sodium: 136 mmol/L (ref 135–145)
Total Bilirubin: 0.6 mg/dL (ref 0.3–1.2)
Total Protein: 5.7 g/dL — ABNORMAL LOW (ref 6.5–8.1)

## 2022-04-03 LAB — CBC WITH DIFFERENTIAL/PLATELET
Abs Immature Granulocytes: 0.09 10*3/uL — ABNORMAL HIGH (ref 0.00–0.07)
Basophils Absolute: 0 10*3/uL (ref 0.0–0.1)
Basophils Relative: 0 %
Eosinophils Absolute: 0 10*3/uL (ref 0.0–0.5)
Eosinophils Relative: 0 %
HCT: 30.6 % — ABNORMAL LOW (ref 36.0–46.0)
Hemoglobin: 10.1 g/dL — ABNORMAL LOW (ref 12.0–15.0)
Immature Granulocytes: 1 %
Lymphocytes Relative: 8 %
Lymphs Abs: 0.8 10*3/uL (ref 0.7–4.0)
MCH: 29.3 pg (ref 26.0–34.0)
MCHC: 33 g/dL (ref 30.0–36.0)
MCV: 88.7 fL (ref 80.0–100.0)
Monocytes Absolute: 0.8 10*3/uL (ref 0.1–1.0)
Monocytes Relative: 8 %
Neutro Abs: 7.8 10*3/uL — ABNORMAL HIGH (ref 1.7–7.7)
Neutrophils Relative %: 83 %
Platelets: 190 10*3/uL (ref 150–400)
RBC: 3.45 MIL/uL — ABNORMAL LOW (ref 3.87–5.11)
RDW: 13.4 % (ref 11.5–15.5)
WBC: 9.4 10*3/uL (ref 4.0–10.5)
nRBC: 0 % (ref 0.0–0.2)

## 2022-04-03 LAB — GLUCOSE, CAPILLARY
Glucose-Capillary: 166 mg/dL — ABNORMAL HIGH (ref 70–99)
Glucose-Capillary: 178 mg/dL — ABNORMAL HIGH (ref 70–99)
Glucose-Capillary: 230 mg/dL — ABNORMAL HIGH (ref 70–99)
Glucose-Capillary: 239 mg/dL — ABNORMAL HIGH (ref 70–99)
Glucose-Capillary: 263 mg/dL — ABNORMAL HIGH (ref 70–99)
Glucose-Capillary: 266 mg/dL — ABNORMAL HIGH (ref 70–99)
Glucose-Capillary: 316 mg/dL — ABNORMAL HIGH (ref 70–99)

## 2022-04-03 LAB — CULTURE, BLOOD (ROUTINE X 2)
Culture: NO GROWTH
Special Requests: ADEQUATE

## 2022-04-03 LAB — LIPID PANEL
Cholesterol: 72 mg/dL (ref 0–200)
HDL: 36 mg/dL — ABNORMAL LOW (ref >40)
LDL Cholesterol: 23 mg/dL (ref 0–99)
Total CHOL/HDL Ratio: 2 RATIO
Triglycerides: 65 mg/dL (ref <150)
VLDL: 13 mg/dL (ref 0–40)

## 2022-04-03 LAB — HEPARIN LEVEL (UNFRACTIONATED)
Heparin Unfractionated: 0.2 IU/mL — ABNORMAL LOW (ref 0.30–0.70)
Heparin Unfractionated: 0.51 IU/mL (ref 0.30–0.70)

## 2022-04-03 LAB — BRAIN NATRIURETIC PEPTIDE: B Natriuretic Peptide: 474.1 pg/mL — ABNORMAL HIGH (ref 0.0–100.0)

## 2022-04-03 LAB — SURGICAL PCR SCREEN
MRSA, PCR: NEGATIVE
Staphylococcus aureus: NEGATIVE

## 2022-04-03 LAB — MAGNESIUM: Magnesium: 1.7 mg/dL (ref 1.7–2.4)

## 2022-04-03 MED ORDER — POVIDONE-IODINE 10 % EX SWAB
2.0000 "application " | Freq: Once | CUTANEOUS | Status: AC
  Administered 2022-04-04: 2 via TOPICAL

## 2022-04-03 MED ORDER — INSULIN ASPART 100 UNIT/ML IJ SOLN
0.0000 [IU] | INTRAMUSCULAR | Status: DC
  Administered 2022-04-03: 5 [IU] via SUBCUTANEOUS
  Administered 2022-04-03 (×2): 2 [IU] via SUBCUTANEOUS
  Administered 2022-04-03: 5 [IU] via SUBCUTANEOUS
  Administered 2022-04-04 (×2): 3 [IU] via SUBCUTANEOUS
  Administered 2022-04-04: 1 [IU] via SUBCUTANEOUS
  Administered 2022-04-04: 3 [IU] via SUBCUTANEOUS
  Administered 2022-04-04: 5 [IU] via SUBCUTANEOUS
  Administered 2022-04-05: 1 [IU] via SUBCUTANEOUS
  Administered 2022-04-05: 3 [IU] via SUBCUTANEOUS
  Administered 2022-04-05: 2 [IU] via SUBCUTANEOUS
  Administered 2022-04-05: 1 [IU] via SUBCUTANEOUS

## 2022-04-03 MED ORDER — ACETAMINOPHEN 500 MG PO TABS
1000.0000 mg | ORAL_TABLET | Freq: Once | ORAL | Status: AC
  Administered 2022-04-04: 1000 mg via ORAL
  Filled 2022-04-03: qty 2

## 2022-04-03 MED ORDER — TRANEXAMIC ACID-NACL 1000-0.7 MG/100ML-% IV SOLN
1000.0000 mg | INTRAVENOUS | Status: AC
  Administered 2022-04-04: 1000 mg via INTRAVENOUS
  Filled 2022-04-03: qty 100

## 2022-04-03 MED ORDER — CHLORHEXIDINE GLUCONATE 4 % EX LIQD
60.0000 mL | Freq: Once | CUTANEOUS | Status: AC
  Administered 2022-04-04: 4 via TOPICAL
  Filled 2022-04-03: qty 60

## 2022-04-03 MED ORDER — VANCOMYCIN HCL IN DEXTROSE 1-5 GM/200ML-% IV SOLN: 1000.0000 mg | Freq: Once | INTRAVENOUS | Status: DC

## 2022-04-03 MED FILL — Midazolam HCl Inj 5 MG/5ML (Base Equivalent): INTRAMUSCULAR | Qty: 1 | Status: AC

## 2022-04-03 NOTE — Progress Notes (Signed)
? ?  Patient has been vascularly optimized with B common iliac artery stenting with doppler signals PT/DP Peroneal B ?I have called [facility] to let DR. Lucia Gaskins know  ?Left message with his office @ 8:17 am ? ?Roxy Horseman ?PA-C ?[facility] [phone] ?

## 2022-04-03 NOTE — Progress Notes (Signed)
ANTICOAGULATION CONSULT NOTE  ?Pharmacy Consult for heparin ?Indication: atrial fibrillation ? ?Patient Measurements: ?Height: 5\' 9"  (175.3 cm) ?Weight: 92.4 kg (203 lb 11.3 oz) ?IBW/kg (Calculated) : 66.2 ?Heparin Dosing Weight: 86.4 kg ? ?Vital Signs: ?Temp: 98.1 ?F (36.7 ?C) (05/08 1931) ?Temp Source: Oral (05/08 1931) ?BP: 109/56 (05/08 1931) ?Pulse Rate: 72 (05/08 1931) ? ?Labs: ?Recent Labs  ?  03/31/22 ?1058 03/31/22 ?1828 03/31/22 ?1828 04/01/22 ?0335 04/01/22 ?1055 04/02/22 ?0156 04/03/22 ?0109  ?HGB  --   --    < > 10.6*  --  10.9* 10.1*  ?HCT  --   --   --  32.5*  --  34.1* 30.6*  ?PLT  --   --   --  193  --  192 190  ?APTT 33 39*  --   --   --   --   --   ?HEPARINUNFRC <0.10* <0.10*  --  0.13* 0.30 0.33 0.20*  ?CREATININE  --   --   --  0.98  --  1.00 0.96  ? < > = values in this interval not displayed.  ? ?Estimated Creatinine Clearance: 72.6 mL/min (by C-G formula based on SCr of 0.96 mg/dL). ? ?Assessment: ?61 y.o. female admitted on 5/4 for diabetic foot infection and PMH significant for Afib on PTA Eliquis. Unsure of last known dose, per patient report 3-4 days before admission. Pharmacy consulted to dose IV heparin. ? ?5/9 AM update:  ?Heparin level low after re-start ? ?Goal of Therapy:  ?Heparin level 0.3-0.7 units/mL ?Monitor platelets by anticoagulation protocol: Yes ?  ?Plan:  ?Inc heparin to 1950 units/hr ?Re-check heparin level in 8 hours ? ?7/9, PharmD, BCPS ?Clinical Pharmacist ?Phone: [phone] ? ? ? ? ? ?

## 2022-04-03 NOTE — Progress Notes (Signed)
?      ?                 PROGRESS NOTE ? ?      ?PATIENT DETAILS ?Name: Kerry Randall ?Age: 61 y.o. ?Sex: female ?Date of Birth: 03-16-1961 ?Admit Date: 03/29/2022 ?Admitting Physician Briscoe Deutscher, MD ?AGT:[facility], [facility] ? ?Brief Summary: ?Patient is a 61 y.o.  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  See below for further details. ? ?Significant events: ?5/4>> admit to [facility] for necrotic right TMA stump. ? 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 ?5/8 >> bilateral common iliac artery stenting   ? ?Significant microbiology data: ?5/4>> COVID/influenza PCR: Negative ?5/4>> blood culture: No growth ? ?Procedures: ?None ? ?Consults: ?Orthopedics, vascular surgery ? ?Subjective:  Patient in bed, appears comfortable, denies any headache, no fever, no chest pain or pressure, no shortness of breath , no abdominal pain. No new focal weakness. ? ? ?Objective: ?Vitals: ?Blood pressure 131/62, pulse 68, temperature 98 ?F (36.7 ?C), temperature source Oral, resp. rate 15, height 5\' 9"  (1.753 m), weight 92.4 kg, SpO2 99 %.  ? ?Exam: ? ?Awake Alert, No new F.N deficits, Normal affect ?[place].AT,PERRAL ?Supple Neck, No JVD,   ?Symmetrical Chest wall movement, Good air movement bilaterally, CTAB ?RRR,No Gallops, Rubs or new Murmurs,  ?+ve B.Sounds, Abd Soft, No tenderness,    ?Right TMA stump under bandage. ? ? ?Assessment/Plan: ? ?Necrotic/nonhealing right TMA stump/critical right limb ischemia:  Vascular surgery/orthopedics following - for now Continue empiric IV antibiotics she is s/p  bilateral common iliac artery stenting by vascular surgeon Dr. Blase Mess on 04/02/2022, Dr. Susa Simmonds orthopedics will see the patient on 04/03/2022 for any further surgical intervention from  orthopedic standpoint, patient agreeable for blood transfusion during the perioperative period. ? ?PAF: Rate controlled-continue metoprolol-Eliquis on hold for possible angiogram Monday-in the interim-we will start IV heparin.  CHA2DS2-VASc of at least 4. ? ?PAD-s/p right common iliac artery stent June 2022-angioplasty of distal right SFA: Now status post Bilateral common iliac artery stenting on 04/02/2022. ? ?CAD s/p PCI to RCA 2016: No anginal symptoms-continue anticoagulation-suspect not on antiplatelets is on anticoagulation. ? ?Peripheral neuropathy: Continue Neurontin/Cymbalta ? ?CKD stage IIIa: Creatinine close to baseline-follow periodically. ? ?Normocytic anemia: Mild-likely due to combination of acute illness/CKD-follow CBC periodically. ? ?DM-2 (A1c 7.0 on 5/5): CBGs relatively stable-continue 5 units of Semglee and SSI.  Follow and adjust. ? ?Recent Labs  ?  04/02/22 ?2100 04/03/22 ?3212 04/03/22 ?2482  ?GLUCAP 363* 316* 230*  ? ? ?Pressure Ulcer: ?Pressure Injury 03/30/22 Coccyx Stage 2 -  Partial thickness loss of dermis presenting as a shallow open injury with a red, pink wound bed without slough. (Active)  ?03/30/22 2200  ?Location: Coccyx  ?Location Orientation:   ?Staging: Stage 2 -  Partial thickness loss of dermis presenting as a shallow open injury with a red, pink wound bed without slough.  ?Wound Description (Comments):   ?Present on Admission: Yes  ?Dressing Type Foam - Lift dressing to assess site every shift 04/02/22 2000  ? ?Obesity: ?Estimated body mass index is 30.08 kg/m? as calculated from the following: ?  Height as of this encounter: 5\' 9"  (1.753 m). ?  Weight as of this encounter: 92.4 kg.  ? ?Code status: ?  Code Status:  Full Code  ? ?DVT Prophylaxis ?Place and maintain sequential compression device Start: 03/29/22 2235 ?Starting IV heparin 5/6. ?  ?Family Communication: She prefers to update family herself-I have asked her to let us know if she changes her mind or if her family  members have questions. ? ? ?Disposition Plan: ?Status is: Inpatient ?Remains inpatient appropriate because: Necrotic right TMA stump/critical limb ischemia-not stable for discharge. ?  ?Planned Discharge Destination:Home health ? ? ?Diet: ?Diet Order   ? ?       ?  Diet Carb Modified Fluid consistency: Thin; Room service appropriate? Yes  Diet effective now       ?  ? ?  ?  ? ?  ?  ? ? ?MEDICATIONS: ?Scheduled Meds: ? aspirin EC  81 mg Oral Daily  ? atorvastatin  40 mg Oral Daily  ? DULoxetine  60 mg Oral BID  ? feeding supplement  237 mL Oral BID BM  ? fesoterodine  8 mg Oral Daily  ? gabapentin  1,200 mg Oral BID  ? hydrOXYzine  75 mg Oral Daily  ? insulin aspart  0-9 Units Subcutaneous Q4H  ? insulin glargine-yfgn  5 Units Subcutaneous QHS  ? metoprolol succinate  25 mg Oral Daily  ? mirabegron ER  25 mg Oral Daily  ? nutrition supplement (JUVEN)  1 packet Oral BID BM  ? pantoprazole  40 mg Oral Daily  ? polyethylene glycol  17 g Oral Daily  ? sodium chloride flush  3 mL Intravenous Q12H  ? ?Continuous Infusions: ? sodium chloride    ? aztreonam 2 g (04/03/22 [id number])  ? heparin 1,950 Units/hr (04/03/22 0318)  ? metronidazole 500 mg (04/03/22 0520)  ? vancomycin 1,250 mg (04/02/22 2307)  ? ?PRN Meds:.sodium chloride, acetaminophen **OR** acetaminophen, acetaminophen, albuterol, fentaNYL (SUBLIMAZE) injection, hydrALAZINE, labetalol, ondansetron **OR** ondansetron (ZOFRAN) IV, ondansetron (ZOFRAN) IV, sodium chloride flush ? ? ?I have personally reviewed following labs and imaging studies ? ?LABORATORY DATA: ? ?Recent Labs  ?Lab 03/29/22 ?1427 03/30/22 ?0400 03/31/22 ?0031 04/01/22 ?0335 04/02/22 ?[id number] 04/03/22 ?0109  ?WBC 13.6* 12.3* 13.0* 11.6* 8.8 9.4  ?HGB 11.9* 11.1* 11.1* 10.6* 10.9* 10.1*  ?HCT 36.9 35.0* 34.3* 32.5* 34.1* 30.6*  ?PLT 266 229 222 193 192 190  ?MCV 90.2 92.1 90.7 90.0 90.2 88.7  ?MCH 29.1 29.2 29.4 29.4 28.8 29.3  ?MCHC 32.2 31.7 32.4 32.6 32.0 33.0  ?RDW 14.0 14.0 13.9 13.6 13.6 13.4   ?LYMPHSABS 2.5  --   --   --  1.7 0.8  ?MONOABS 1.1*  --   --   --  0.8 0.8  ?EOSABS 0.2  --   --   --  0.4 0.0  ?BASOSABS 0.1  --   --   --  0.0 0.0  ? ? ?Recent Labs  ?Lab 03/29/22 ?1427 03/29/22 ?2142 03/30/22 ?0400 03/31/22 ?0031 04/01/22 ?0335 04/02/22 ?[id number] 04/03/22 ?0109  ?NA 137  --  139 137 137 138 136  ?K 4.2  --  4.0 4.7 4.6 3.7 4.1  ?CL 110  --  110 108 108 111 110  ?CO2 20*  --  19* 24 21* 23 20*  ?GLUCOSE 296*  --  203* 225* 175* 168* 304*  ?BUN 17  --  14 16 18 21  24*  ?CREATININE 1.08*  --  0.92 1.05* 0.98 1.00 0.96  ?CALCIUM 8.6*  --  8.7* 8.7* 8.7* 8.8* 8.5*  ?AST 9*  --   --   --   --  11* 10*  ?ALT 10  --   --   --   --  9 11  ?ALKPHOS 89  --   --   --   --  84 76  ?BILITOT 0.5  --   --   --   --  0.2* 0.6  ?ALBUMIN 2.6*  --   --   --   --  2.2* 2.1*  ?MG  --   --   --   --   --  1.8 1.7  ?CRP  --  17.7* 18.0* 16.2*  --   --   --   ?LATICACIDVEN 1.5  --   --   --   --   --   --   ?HGBA1C  --   --  7.0*  --   --   --   --   ?BNP  --   --   --   --   --  243.6* 474.1*  ? ?RADIOLOGY STUDIES/RESULTS: ?PERIPHERAL VASCULAR CATHETERIZATION ? ?Result Date: 04/02/2022 ?DATE OF SERVICE: 04/02/2022  PATIENT:  Qunita Aase  61 y.o. female  PRE-OPERATIVE DIAGNOSIS:  Atherosclerosis of native arteries of right lower extremity causing ulceration  POST-OPERATIVE DIAGNOSIS:  Occlusion of right common iliac artery stent  PROCEDURE:  1) ultrasound guided bilateral common femoral artery access 2) Aortogram 3) Bilateral common iliac artery stenting (7x66mm VBX x2) 4) Right lower extremity angiogram (139mL total contrast) 5) Conscious sedation (89 minutes)  SURGEON:  Yevonne Aline. Stanford Breed, MD  ASSISTANT: none  ANESTHESIA:   local and IV sedation  ESTIMATED BLOOD LOSS: minimal  LOCAL MEDICATIONS USED:  LIDOCAINE  COUNTS: confirmed correct.  PATIENT DISPOSITION:  PACU - hemodynamically stable.  Delay start of Pharmacological VTE agent (>24hrs) due to surgical blood loss or risk of bleeding: no  INDICATION FOR PROCEDURE:  Piccola Courtier is a 61 y.o. female with breakdown of right transmetatarsal amputation, done elsewhere. Patient was referred to [facility] ER for major amputation. Orthopedics astutely recommended formal vascular evaluation pr

## 2022-04-03 NOTE — Evaluation (Signed)
Physical Therapy Evaluation ?Patient Details ?Name: Kerry Randall ?MRN: [id number] ?DOB: 09/25/1961 ?Today's Date: 04/03/2022 ? ?History of Present Illness ? Pt is a 61 y/o F presenting to ED from podiatrist on 5/4 with R diabetic food ulcer now s/p bilateral common iliac artery stenting 5/8. Awaiting possible amputation? PMH includes A fib, TMA, DM, hypercholesteremia, HTN, MDD, and vitamin D deficiency.  ?Clinical Impression ? Patient presents with post surgical deficits s/p above surgery. Pt lives alone in an apt and reports being independent for ADLs and IADLs PTA. Pt does not drive and has groceries delivered. Today, pt tolerated short distance ambulation with supervision for safety due to first time seeing pt up, managing IV pole for lines. Placing most of her weight through her heel during ambulation. Reports her post op shoes at home are all worn out; may need a new one if swelling prevents her from putting on a shoe. Pt will likely progress well with mobility and will not need any follow up. Will follow acutely at least 1 more visit to increase ambulation and higher level balance prior to return home. Amputation of RLE is still pending after consult with ortho.   ?   ? ?Recommendations for follow up therapy are one component of a multi-disciplinary discharge planning process, led by the attending physician.  Recommendations may be updated based on patient status, additional functional criteria and insurance authorization. ? ?Follow Up Recommendations No PT follow up ? ?  ?Assistance Recommended at Discharge PRN  ?Patient can return home with the following ? Assist for transportation;Assistance with cooking/housework ? ?  ?Equipment Recommendations None recommended by PT  ?Recommendations for Other Services ?    ?  ?Functional Status Assessment Patient has had a recent decline in their functional status and demonstrates the ability to make significant improvements in function in a reasonable and predictable  amount of time.  ? ?  ?Precautions / Restrictions Precautions ?Precautions: Fall ?Restrictions ?Weight Bearing Restrictions: No  ? ?  ? ?Mobility ? Bed Mobility ?Overal bed mobility: Needs Assistance ?Bed Mobility: Supine to Sit, Sit to Supine ?  ?  ?Supine to sit: Independent ?Sit to supine: Independent ?  ?General bed mobility comments: No assist needed. ?  ? ?Transfers ?Overall transfer level: Modified independent ?Equipment used: None ?  ?  ?  ?  ?  ?  ?  ?General transfer comment: Stood from EOB without difficulty. Managing lines and IV pole. ?  ? ?Ambulation/Gait ?Ambulation/Gait assistance: Supervision ?Gait Distance (Feet): 15 Feet (x2 bouts) ?Assistive device: IV Pole ?Gait Pattern/deviations: Step-through pattern, Decreased stride length ?Gait velocity: decreased ?  ?  ?General Gait Details: Slow, steady gait holding onto IV pole for support. Mainly walking on heel on RLE. ? ?Stairs ?  ?  ?  ?  ?  ? ?Wheelchair Mobility ?  ? ?Modified Rankin (Stroke Patients Only) ?  ? ?  ? ?Balance Overall balance assessment: Needs assistance ?Sitting-balance support: Feet supported, No upper extremity supported ?Sitting balance-Leahy Scale: Good ?  ?  ?Standing balance support: During functional activity ?Standing balance-Leahy Scale: Fair ?Standing balance comment: Able to walk without UE support, managing IV pole for convenience. ?  ?  ?  ?  ?  ?  ?  ?  ?  ?  ?  ?   ? ? ? ?Pertinent Vitals/Pain Pain Assessment ?Pain Assessment: No/denies pain  ? ? ?Home Living Family/patient expects to be discharged to:: Private residence ?Living Arrangements: Alone ?  ?Type of Home:  Apartment ?Home Access: Elevator ?  ?  ?  ?Home Layout: One level ?Home Equipment: None ?   ?  ?Prior Function Prior Level of Function : Independent/Modified Independent ?  ?  ?  ?  ?  ?  ?Mobility Comments: Independent, does not work. Hx of falls. ?ADLs Comments: independent ?  ? ? ?Hand Dominance  ?   ? ?  ?Extremity/Trunk Assessment  ? Upper Extremity  Assessment ?Upper Extremity Assessment: Defer to OT evaluation ?  ? ?Lower Extremity Assessment ?Lower Extremity Assessment: RLE deficits/detail;LLE deficits/detail ?RLE Sensation: history of peripheral neuropathy;decreased light touch ?LLE Sensation: history of peripheral neuropathy;decreased light touch ?  ? ?Cervical / Trunk Assessment ?Cervical / Trunk Assessment: Normal  ?Communication  ? Communication: No difficulties  ?Cognition Arousal/Alertness: Awake/alert ?Behavior During Therapy: [facility] for tasks assessed/performed ?Overall Cognitive Status: Within Functional Limits for tasks assessed ?  ?  ?  ?  ?  ?  ?  ?  ?  ?  ?  ?  ?  ?  ?  ?  ?  ?  ?  ? ?  ?General Comments General comments (skin integrity, edema, etc.): VSS on RA. ? ?  ?Exercises    ? ?Assessment/Plan  ?  ?PT Assessment Patient needs continued PT services  ?PT Problem List Impaired sensation;Decreased balance;Decreased skin integrity;Decreased strength;Decreased mobility ? ?   ?  ?PT Treatment Interventions Therapeutic exercise;Gait training;Patient/family education;Therapeutic activities;Functional mobility training;Balance training   ? ?PT Goals (Current goals can be found in the Care Plan section)  ?Acute Rehab PT Goals ?Patient Stated Goal: to go home ?PT Goal Formulation: With patient ?Time For Goal Achievement: 04/17/22 ?Potential to Achieve Goals: Good ? ?  ?Frequency Min 2X/week ?  ? ? ?Co-evaluation   ?  ?  ?  ?  ? ? ?  ?AM-PAC PT "6 Clicks" Mobility  ?Outcome Measure Help needed turning from your back to your side while in a flat bed without using bedrails?: None ?Help needed moving from lying on your back to sitting on the side of a flat bed without using bedrails?: None ?Help needed moving to and from a bed to a chair (including a wheelchair)?: None ?Help needed standing up from a chair using your arms (e.g., wheelchair or bedside chair)?: None ?Help needed to walk in hospital room?: A Little ?Help needed climbing 3-5 steps with a  railing? : A Little ?6 Click Score: 22 ? ?  ?End of Session   ?Activity Tolerance: Patient tolerated treatment well ?Patient left: in bed;with call bell/phone within reach ?Nurse Communication: Mobility status ?PT Visit Diagnosis: Difficulty in walking, not elsewhere classified (R26.2) ?  ? ?Time: [id number] ?PT Time Calculation (min) (ACUTE ONLY): 18 min ? ? ?Charges:   PT Evaluation ?$PT Eval Moderate Complexity: 1 Mod ?  ?  ?   ? ? ?Marisa Severin, PT, DPT ?Acute Rehabilitation Services ?Secure chat preferred ?Office [phone] ? ? ? ? ?[place] ?04/03/2022, 9:11 AM ? ?

## 2022-04-03 NOTE — Progress Notes (Signed)
ANTICOAGULATION CONSULT NOTE  ?Pharmacy Consult for heparin ?Indication: atrial fibrillation ? ?Patient Measurements: ?Height: 5\' 9"  (175.3 cm) ?Weight: 92.4 kg (203 lb 11.3 oz) ?IBW/kg (Calculated) : 66.2 ?Heparin Dosing Weight: 86.4 kg ? ?Vital Signs: ?Temp: 98 ?F (36.7 ?C) (05/09 1128) ?Temp Source: Oral (05/09 1128) ?BP: 127/67 (05/09 1128) ?Pulse Rate: 76 (05/09 1128) ? ?Labs: ?Recent Labs  ?  03/31/22 ?1828 03/31/22 ?1828 04/01/22 ?0335 04/01/22 ?1055 04/02/22 ?0156 04/03/22 ?0109 04/03/22 ?1054  ?HGB  --    < > 10.6*  --  10.9* 10.1*  --   ?HCT  --   --  32.5*  --  34.1* 30.6*  --   ?PLT  --   --  193  --  192 190  --   ?APTT 39*  --   --   --   --   --   --   ?HEPARINUNFRC <0.10*  --  0.13*   < > 0.33 0.20* 0.51  ?CREATININE  --   --  0.98  --  1.00 0.96  --   ? < > = values in this interval not displayed.  ? ?Estimated Creatinine Clearance: 72.6 mL/min (by C-G formula based on SCr of 0.96 mg/dL). ? ?Assessment: ?61 y.o. female admitted on 5/4 for diabetic foot infection and PMH significant for Afib on PTA Eliquis. Unsure of last known dose, per patient report 3-4 days before admission. Pharmacy consulted to dose IV heparin. ? ?5/9 AM update:  ?Heparin level therapeutic at 0.51 ? ?Goal of Therapy:  ?Heparin level 0.3-0.7 units/mL ?Monitor platelets by anticoagulation protocol: Yes ?  ?Plan:  ?Continue heparin at 1950 units/hr ?Check heparin level with am labs ? ?7/9, PharmD, FCCM ?Clinical Pharmacist ?Please see AMION for all Pharmacists' Contact Phone Numbers ?04/03/2022, 12:25 PM  ? ? ? ? ? ? ?

## 2022-04-03 NOTE — Progress Notes (Signed)
[facility] ? ?Subjective  -no complaints ? ? ?Objective ?131/62 ?68 ?98 ?F (36.7 ?C) (Oral) ?15 ?99% ? ?Intake/Output Summary (Last 24 hours) at 04/03/2022 0757 ?Last data filed at 04/02/2022 1642 ?Gross per 24 hour  ?Intake --  ?Output 600 ml  ?Net -600 ml  ? ? ? ? ? ?Laboratory ?Lab Results: ?Recent Labs  ?  04/02/22 ?0156 04/03/22 ?0109  ?WBC 8.8 9.4  ?HGB 10.9* 10.1*  ?HCT 34.1* 30.6*  ?PLT 192 190  ? ?BMET ?Recent Labs  ?  04/02/22 ?0156 04/03/22 ?0109  ?NA 138 136  ?K 3.7 4.1  ?CL 111 110  ?CO2 23 20*  ?GLUCOSE 168* 304*  ?BUN 21 24*  ?CREATININE 1.00 0.96  ?CALCIUM 8.8* 8.5*  ? ? ?COAG ?No results found for: INR, PROTIME ?No results found for: PTT ? ?Assessment/Planning: ? ?Postop day 1 status post bilateral common iliac artery stenting in the setting of CLI with tissue loss and occluded right common iliac stent.  Bilateral groins look good today.  Palpable femoral pulses bilaterally.  She is optimized from a vascular standpoint.  She was seen by orthopedics in the ED and will defer to them for soft tissue management.  Aspirin and statin.  Plavix after amputation. ? ?Cephus Shelling ?04/03/2022 ?7:57 AM ?-- ? ? ?

## 2022-04-03 NOTE — Evaluation (Signed)
Occupational Therapy Evaluation and Discharge Summary ?Patient Details ?Name: Kerry Randall ?MRN: [id number] ?DOB: 23-Feb-1961 ?Today's Date: 04/03/2022 ? ? ?History of Present Illness Pt is a 61 y/o F presenting to ED from podiatrist on 5/4 with R diabetic food ulcer now s/p bilateral common iliac artery stenting 5/8. Awaiting possible amputation? PMH includes A fib, TMA, DM, hypercholesteremia, HTN, MDD, and vitamin D deficiency.  ? ?Clinical Impression ?  ?Pt admitted with the above diagnosis and has the deficits listed below. Pt is at baseline with most adls and is not in need of skilled OT here or at home.  Pt has been using an office chair to roll around her house when in pain which is not safe.  Pt would benefit from a rollator to use in place of this office chair when needing a place to sit and move. Pt's kitchen is very small therefore rollator would be better than a w/c and pt can use it to get her mail on a more regular basis. Now pt can only get mail when someone brings it to her bc mailbox is so far away.  This would allow her safety in her own home and give her the ability to get her mail.  No post acute OT needs at this time.  If it is decided that this pt needs an amputation, please reorder OT services. ?  ?   ? ?Recommendations for follow up therapy are one component of a multi-disciplinary discharge planning process, led by the attending physician.  Recommendations may be updated based on patient status, additional functional criteria and insurance authorization.  ? ?Follow Up Recommendations ? No OT follow up  ?  ?Assistance Recommended at Discharge Intermittent Supervision/Assistance  ?Patient can return home with the following Assist for transportation ? ?  ?Functional Status Assessment ? Patient has not had a recent decline in their functional status  ?Equipment Recommendations ? Tub/shower seat;Other (comment) (rollator)  ?  ?Recommendations for Other Services   ? ? ?  ?Precautions / Restrictions  Precautions ?Precautions: Fall ?Precaution Comments: Pt has fallen twice in last month. Once was off her couch when she fell asleep ?Restrictions ?Weight Bearing Restrictions: No  ? ?  ? ?Mobility Bed Mobility ?Overal bed mobility: Needs Assistance ?Bed Mobility: Supine to Sit, Sit to Supine ?  ?  ?Supine to sit: Independent ?Sit to supine: Independent ?  ?General bed mobility comments: No assist needed. ?  ? ?Transfers ?Overall transfer level: Modified independent ?Equipment used: None ?  ?  ?  ?  ?  ?  ?  ?General transfer comment: Stood from EOB without difficulty. Managing lines and IV pole. ?  ? ?  ?Balance   ?  ?  ?  ?  ?  ?  ?  ?  ?  ?  ?  ?  ?  ?  ?  ?  ?  ?  ?   ? ?ADL either performed or assessed with clinical judgement  ? ?ADL Overall ADL's : At baseline ?  ?  ?  ?  ?  ?  ?  ?  ?  ?  ?  ?  ?  ?  ?  ?  ?  ?  ?  ?General ADL Comments: Pt is only limited by IVs in B UEs when walking.  ? ? ? ?Vision Baseline Vision/History: 0 No visual deficits ?Ability to See in Adequate Light: 0 Adequate ?Patient Visual Report: No change from baseline ?Vision Assessment?: No apparent  visual deficits  ?   ?Perception   ?  ?Praxis   ?  ? ?Pertinent Vitals/Pain Pain Assessment ?Pain Assessment: 0-10 ?Pain Score: 4  ?Pain Location: leg ?Pain Descriptors / Indicators: Sore ?Pain Intervention(s): Monitored during session  ? ? ? ?Hand Dominance Right ?  ?Extremity/Trunk Assessment Upper Extremity Assessment ?Upper Extremity Assessment: Overall WFL for tasks assessed ?  ?Lower Extremity Assessment ?Lower Extremity Assessment: Defer to PT evaluation ?RLE Sensation: history of peripheral neuropathy;decreased light touch ?LLE Sensation: history of peripheral neuropathy;decreased light touch ?  ?Cervical / Trunk Assessment ?Cervical / Trunk Assessment: Normal ?  ?Communication Communication ?Communication: No difficulties ?  ?Cognition Arousal/Alertness: Awake/alert ?Behavior During Therapy: [facility] for tasks assessed/performed ?Overall  Cognitive Status: Within Functional Limits for tasks assessed ?  ?  ?  ?  ?  ?  ?  ?  ?  ?  ?  ?  ?  ?  ?  ?  ?  ?  ?  ?General Comments  VSS on RA. ? ?  ?Exercises   ?  ?Shoulder Instructions    ? ? ?Home Living Family/patient expects to be discharged to:: Private residence ?Living Arrangements: Alone ?Available Help at Discharge: Friend(s);Other (Comment) (friend checks on her once a day.) ?Type of Home: Apartment ?Home Access: Elevator ?  ?  ?Home Layout: One level ?  ?  ?Bathroom Shower/Tub: Tub/shower unit ?  ?Bathroom Toilet: Standard ?  ?  ?Home Equipment: None ?  ?Additional Comments: needs shower chair. Leans on wall to bathe and keep balance. ?  ? ?  ?Prior Functioning/Environment Prior Level of Function : Independent/Modified Independent ?  ?  ?  ?  ?  ?  ?Mobility Comments: Independent, does not work. Hx of falls. ?ADLs Comments: independent. Has used office chair to roll around lately.  May benefit from rollator for safer option. ?  ? ?  ?  ?OT Problem List: Impaired balance (sitting and/or standing);Pain ?  ?   ?OT Treatment/Interventions:    ?  ?OT Goals(Current goals can be found in the care plan section) Acute Rehab OT Goals ?Patient Stated Goal: to go home ?OT Goal Formulation: All assessment and education complete, DC therapy  ?OT Frequency:   ?  ? ?Co-evaluation   ?  ?  ?  ?  ? ?  ?AM-PAC OT "6 Clicks" Daily Activity     ?Outcome Measure Help from another person eating meals?: None ?Help from another person taking care of personal grooming?: None ?Help from another person toileting, which includes using toliet, bedpan, or urinal?: None ?Help from another person bathing (including washing, rinsing, drying)?: None ?Help from another person to put on and taking off regular upper body clothing?: None ?Help from another person to put on and taking off regular lower body clothing?: None ?6 Click Score: 24 ?  ?End of Session Nurse Communication: Other (comment) (need for rollator and BSC for  shower) ? ?Activity Tolerance: Patient tolerated treatment well ?Patient left: in bed;with call bell/phone within reach ? ?OT Visit Diagnosis: Unsteadiness on feet (R26.81)  ?              ?Time: 2951-8841 ?OT Time Calculation (min): 13 min ?Charges:  OT General Charges ?$OT Visit: 1 Visit ?OT Evaluation ?$OT Eval Low Complexity: 1 Low ? ?Hope Budds ?04/03/2022, 9:46 AM ?

## 2022-04-04 ENCOUNTER — Encounter (HOSPITAL_COMMUNITY): Payer: Self-pay | Admitting: Family Medicine

## 2022-04-04 ENCOUNTER — Encounter (HOSPITAL_COMMUNITY)
Admission: EM | Disposition: A | Discharge status: Skilled Nursing Facility | Payer: Self-pay | Source: Home / Self Care | Attending: Internal Medicine

## 2022-04-04 ENCOUNTER — Other Ambulatory Visit: Payer: Self-pay

## 2022-04-04 ENCOUNTER — Inpatient Hospital Stay (HOSPITAL_COMMUNITY): Payer: Medicare Other | Admitting: Anesthesiology

## 2022-04-04 DIAGNOSIS — E1152 Type 2 diabetes mellitus with diabetic peripheral angiopathy with gangrene: Secondary | ICD-10-CM

## 2022-04-04 DIAGNOSIS — I96 Gangrene, not elsewhere classified: Secondary | ICD-10-CM | POA: Diagnosis not present

## 2022-04-04 DIAGNOSIS — E43 Unspecified severe protein-calorie malnutrition: Secondary | ICD-10-CM | POA: Diagnosis not present

## 2022-04-04 DIAGNOSIS — I2510 Atherosclerotic heart disease of native coronary artery without angina pectoris: Secondary | ICD-10-CM

## 2022-04-04 DIAGNOSIS — I739 Peripheral vascular disease, unspecified: Secondary | ICD-10-CM | POA: Diagnosis not present

## 2022-04-04 DIAGNOSIS — N182 Chronic kidney disease, stage 2 (mild): Secondary | ICD-10-CM

## 2022-04-04 DIAGNOSIS — I129 Hypertensive chronic kidney disease with stage 1 through stage 4 chronic kidney disease, or unspecified chronic kidney disease: Secondary | ICD-10-CM

## 2022-04-04 DIAGNOSIS — E11628 Type 2 diabetes mellitus with other skin complications: Secondary | ICD-10-CM | POA: Diagnosis not present

## 2022-04-04 DIAGNOSIS — E1122 Type 2 diabetes mellitus with diabetic chronic kidney disease: Secondary | ICD-10-CM

## 2022-04-04 HISTORY — PX: AMPUTATION: SHX166

## 2022-04-04 LAB — COMPREHENSIVE METABOLIC PANEL
ALT: 11 U/L (ref 0–44)
AST: 11 U/L — ABNORMAL LOW (ref 15–41)
Albumin: 2.1 g/dL — ABNORMAL LOW (ref 3.5–5.0)
Alkaline Phosphatase: 64 U/L (ref 38–126)
Anion gap: 7 (ref 5–15)
BUN: 31 mg/dL — ABNORMAL HIGH (ref 8–23)
CO2: 20 mmol/L — ABNORMAL LOW (ref 22–32)
Calcium: 8.6 mg/dL — ABNORMAL LOW (ref 8.9–10.3)
Chloride: 112 mmol/L — ABNORMAL HIGH (ref 98–111)
Creatinine, Ser: 0.98 mg/dL (ref 0.44–1.00)
GFR, Estimated: >60 mL/min (ref >60)
Glucose, Bld: 206 mg/dL — ABNORMAL HIGH (ref 70–99)
Potassium: 3.8 mmol/L (ref 3.5–5.1)
Sodium: 139 mmol/L (ref 135–145)
Total Bilirubin: 0.5 mg/dL (ref 0.3–1.2)
Total Protein: 5.5 g/dL — ABNORMAL LOW (ref 6.5–8.1)

## 2022-04-04 LAB — GLUCOSE, CAPILLARY
Glucose-Capillary: 112 mg/dL — ABNORMAL HIGH (ref 70–99)
Glucose-Capillary: 118 mg/dL — ABNORMAL HIGH (ref 70–99)
Glucose-Capillary: 128 mg/dL — ABNORMAL HIGH (ref 70–99)
Glucose-Capillary: 196 mg/dL — ABNORMAL HIGH (ref 70–99)
Glucose-Capillary: 205 mg/dL — ABNORMAL HIGH (ref 70–99)
Glucose-Capillary: 216 mg/dL — ABNORMAL HIGH (ref 70–99)
Glucose-Capillary: 259 mg/dL — ABNORMAL HIGH (ref 70–99)
Glucose-Capillary: 79 mg/dL (ref 70–99)
Glucose-Capillary: 88 mg/dL (ref 70–99)

## 2022-04-04 LAB — CBC WITH DIFFERENTIAL/PLATELET
Abs Immature Granulocytes: 0.09 10*3/uL — ABNORMAL HIGH (ref 0.00–0.07)
Basophils Absolute: 0 10*3/uL (ref 0.0–0.1)
Basophils Relative: 0 %
Eosinophils Absolute: 0.3 10*3/uL (ref 0.0–0.5)
Eosinophils Relative: 3 %
HCT: 30 % — ABNORMAL LOW (ref 36.0–46.0)
Hemoglobin: 9.6 g/dL — ABNORMAL LOW (ref 12.0–15.0)
Immature Granulocytes: 1 %
Lymphocytes Relative: 27 %
Lymphs Abs: 2.7 10*3/uL (ref 0.7–4.0)
MCH: 28.8 pg (ref 26.0–34.0)
MCHC: 32 g/dL (ref 30.0–36.0)
MCV: 90.1 fL (ref 80.0–100.0)
Monocytes Absolute: 0.9 10*3/uL (ref 0.1–1.0)
Monocytes Relative: 9 %
Neutro Abs: 5.8 10*3/uL (ref 1.7–7.7)
Neutrophils Relative %: 60 %
Platelets: 172 10*3/uL (ref 150–400)
RBC: 3.33 MIL/uL — ABNORMAL LOW (ref 3.87–5.11)
RDW: 13.6 % (ref 11.5–15.5)
WBC: 9.7 10*3/uL (ref 4.0–10.5)
nRBC: 0 % (ref 0.0–0.2)

## 2022-04-04 LAB — BRAIN NATRIURETIC PEPTIDE: B Natriuretic Peptide: 236.9 pg/mL — ABNORMAL HIGH (ref 0.0–100.0)

## 2022-04-04 LAB — HEPARIN LEVEL (UNFRACTIONATED): Heparin Unfractionated: 0.4 IU/mL (ref 0.30–0.70)

## 2022-04-04 LAB — MAGNESIUM: Magnesium: 1.6 mg/dL — ABNORMAL LOW (ref 1.7–2.4)

## 2022-04-04 SURGERY — AMPUTATION BELOW KNEE
Anesthesia: Monitor Anesthesia Care | Site: Knee | Laterality: Right

## 2022-04-04 MED ORDER — JUVEN PO PACK
1.0000 | PACK | Freq: Two times a day (BID) | ORAL | Status: DC
  Administered 2022-04-04 – 2022-04-07 (×5): 1 via ORAL
  Filled 2022-04-04 (×4): qty 1

## 2022-04-04 MED ORDER — GUAIFENESIN-DM 100-10 MG/5ML PO SYRP: 15.0000 mL | ORAL_SOLUTION | ORAL | Status: DC | PRN

## 2022-04-04 MED ORDER — FENTANYL CITRATE (PF) 100 MCG/2ML IJ SOLN: 50.0000 ug | Freq: Once | INTRAMUSCULAR | Status: AC

## 2022-04-04 MED ORDER — INSULIN GLARGINE-YFGN 100 UNIT/ML ~~LOC~~ SOLN
8.0000 [IU] | Freq: Every day | SUBCUTANEOUS | Status: DC
  Administered 2022-04-04 – 2022-04-06 (×3): 8 [IU] via SUBCUTANEOUS
  Filled 2022-04-04 (×4): qty 0.08

## 2022-04-04 MED ORDER — DOCUSATE SODIUM 100 MG PO CAPS
100.0000 mg | ORAL_CAPSULE | Freq: Every day | ORAL | Status: DC
  Administered 2022-04-05: 100 mg via ORAL
  Filled 2022-04-04 (×4): qty 1

## 2022-04-04 MED ORDER — MIDAZOLAM HCL 2 MG/2ML IJ SOLN: 2.0000 mg | Freq: Once | INTRAMUSCULAR | Status: AC

## 2022-04-04 MED ORDER — HYDROMORPHONE HCL 2 MG PO TABS: 2.0000 mg | ORAL_TABLET | ORAL | Status: DC | PRN

## 2022-04-04 MED ORDER — SODIUM CHLORIDE 0.9 % IV SOLN: INTRAVENOUS | Status: DC

## 2022-04-04 MED ORDER — BUPIVACAINE LIPOSOME 1.3 % IJ SUSP
INTRAMUSCULAR | Status: DC | PRN
  Administered 2022-04-04: 10 mL

## 2022-04-04 MED ORDER — CHLORHEXIDINE GLUCONATE 0.12 % MT SOLN
15.0000 mL | Freq: Once | OROMUCOSAL | Status: AC
  Administered 2022-04-04: 15 mL via OROMUCOSAL
  Filled 2022-04-04: qty 15

## 2022-04-04 MED ORDER — ASCORBIC ACID 500 MG PO TABS
1000.0000 mg | ORAL_TABLET | Freq: Every day | ORAL | Status: DC
  Administered 2022-04-04 – 2022-04-07 (×4): 1000 mg via ORAL
  Filled 2022-04-04 (×4): qty 2

## 2022-04-04 MED ORDER — POTASSIUM CHLORIDE CRYS ER 20 MEQ PO TBCR: 20.0000 meq | EXTENDED_RELEASE_TABLET | Freq: Every day | ORAL | Status: DC | PRN

## 2022-04-04 MED ORDER — MAGNESIUM SULFATE 2 GM/50ML IV SOLN: 2.0000 g | Freq: Every day | INTRAVENOUS | Status: DC | PRN

## 2022-04-04 MED ORDER — ORAL CARE MOUTH RINSE: 15.0000 mL | Freq: Once | OROMUCOSAL | Status: AC

## 2022-04-04 MED ORDER — METOPROLOL TARTRATE 5 MG/5ML IV SOLN
5.0000 mg | INTRAVENOUS | Status: DC | PRN
Start: 2022-04-04 — End: 2022-04-07

## 2022-04-04 MED ORDER — MAGNESIUM SULFATE 2 GM/50ML IV SOLN
2.0000 g | Freq: Once | INTRAVENOUS | Status: AC
  Administered 2022-04-04: 2 g via INTRAVENOUS
  Filled 2022-04-04: qty 50

## 2022-04-04 MED ORDER — FENTANYL CITRATE (PF) 100 MCG/2ML IJ SOLN: 25.0000 ug | INTRAMUSCULAR | Status: DC | PRN

## 2022-04-04 MED ORDER — FENTANYL CITRATE (PF) 250 MCG/5ML IJ SOLN
INTRAMUSCULAR | Status: AC
  Filled 2022-04-04: qty 5

## 2022-04-04 MED ORDER — MAGNESIUM CITRATE PO SOLN
1.0000 | Freq: Once | ORAL | Status: DC | PRN
  Filled 2022-04-04: qty 296

## 2022-04-04 MED ORDER — PANTOPRAZOLE SODIUM 40 MG PO TBEC: 40.0000 mg | DELAYED_RELEASE_TABLET | Freq: Every day | ORAL | Status: DC

## 2022-04-04 MED ORDER — PHENOL 1.4 % MT LIQD: 1.0000 | OROMUCOSAL | Status: DC | PRN

## 2022-04-04 MED ORDER — HYDROCODONE-ACETAMINOPHEN 5-325 MG PO TABS
1.0000 | ORAL_TABLET | Freq: Four times a day (QID) | ORAL | Status: DC | PRN
  Administered 2022-04-05 – 2022-04-07 (×5): 2 via ORAL
  Filled 2022-04-04 (×5): qty 2

## 2022-04-04 MED ORDER — HYDROMORPHONE HCL 1 MG/ML IJ SOLN: 0.5000 mg | INTRAMUSCULAR | Status: DC | PRN

## 2022-04-04 MED ORDER — BISACODYL 5 MG PO TBEC: 5.0000 mg | DELAYED_RELEASE_TABLET | Freq: Every day | ORAL | Status: DC | PRN

## 2022-04-04 MED ORDER — HYDRALAZINE HCL 20 MG/ML IJ SOLN: 5.0000 mg | INTRAMUSCULAR | Status: DC | PRN

## 2022-04-04 MED ORDER — ONDANSETRON HCL 4 MG/2ML IJ SOLN: 4.0000 mg | Freq: Four times a day (QID) | INTRAMUSCULAR | Status: DC | PRN

## 2022-04-04 MED ORDER — AMISULPRIDE (ANTIEMETIC) 5 MG/2ML IV SOLN: 10.0000 mg | Freq: Once | INTRAVENOUS | Status: DC | PRN

## 2022-04-04 MED ORDER — 0.9 % SODIUM CHLORIDE (POUR BTL) OPTIME
TOPICAL | Status: DC | PRN
Start: 2022-04-04 — End: 2022-04-04
  Administered 2022-04-04: 1000 mL

## 2022-04-04 MED ORDER — LABETALOL HCL 5 MG/ML IV SOLN: 10.0000 mg | INTRAVENOUS | Status: DC | PRN

## 2022-04-04 MED ORDER — FENTANYL CITRATE (PF) 100 MCG/2ML IJ SOLN
INTRAMUSCULAR | Status: AC
  Administered 2022-04-04: 50 ug via INTRAVENOUS
  Filled 2022-04-04: qty 2

## 2022-04-04 MED ORDER — POLYETHYLENE GLYCOL 3350 17 G PO PACK: 17.0000 g | PACK | Freq: Every day | ORAL | Status: DC | PRN

## 2022-04-04 MED ORDER — ALUM & MAG HYDROXIDE-SIMETH 200-200-20 MG/5ML PO SUSP: 15.0000 mL | ORAL | Status: DC | PRN

## 2022-04-04 MED ORDER — MIDAZOLAM HCL 2 MG/2ML IJ SOLN
INTRAMUSCULAR | Status: AC
Start: 2022-04-04 — End: 2022-04-04
  Administered 2022-04-04: 2 mg via INTRAVENOUS
  Filled 2022-04-04: qty 2

## 2022-04-04 MED ORDER — DEXTROSE-NACL 5-0.45 % IV SOLN: INTRAVENOUS | Status: AC

## 2022-04-04 MED ORDER — ONDANSETRON HCL 4 MG/2ML IJ SOLN: 4.0000 mg | Freq: Once | INTRAMUSCULAR | Status: DC | PRN

## 2022-04-04 MED ORDER — LACTATED RINGERS IV SOLN: INTRAVENOUS | Status: DC

## 2022-04-04 MED ORDER — ZINC SULFATE 220 (50 ZN) MG PO CAPS
220.0000 mg | ORAL_CAPSULE | Freq: Every day | ORAL | Status: DC
  Administered 2022-04-04 – 2022-04-07 (×4): 220 mg via ORAL
  Filled 2022-04-04 (×4): qty 1

## 2022-04-04 MED ORDER — BUPIVACAINE HCL (PF) 0.5 % IJ SOLN
INTRAMUSCULAR | Status: DC | PRN
  Administered 2022-04-04 (×2): 15 mL via PERINEURAL

## 2022-04-04 MED ORDER — METOPROLOL TARTRATE 5 MG/5ML IV SOLN: 2.0000 mg | INTRAVENOUS | Status: DC | PRN

## 2022-04-04 MED ORDER — PROPOFOL 500 MG/50ML IV EMUL
INTRAVENOUS | Status: DC | PRN
  Administered 2022-04-04: 75 ug/kg/min via INTRAVENOUS

## 2022-04-04 SURGICAL SUPPLY — 39 items
BAG COUNTER SPONGE SURGICOUNT (BAG) IMPLANT
BLADE SAW RECIP 87.9 MT (BLADE) ×2 IMPLANT
BLADE SURG 21 STRL SS (BLADE) ×2 IMPLANT
BNDG COHESIVE 6X5 TAN STRL LF (GAUZE/BANDAGES/DRESSINGS) IMPLANT
CANISTER WOUND CARE 500ML ATS (WOUND CARE) ×2 IMPLANT
COVER SURGICAL LIGHT HANDLE (MISCELLANEOUS) ×2 IMPLANT
CUFF TOURN SGL QUICK 34 (TOURNIQUET CUFF) ×1
CUFF TRNQT CYL 34X4.125X (TOURNIQUET CUFF) ×1 IMPLANT
DRAPE INCISE IOBAN 66X45 STRL (DRAPES) ×2 IMPLANT
DRAPE U-SHAPE 47X51 STRL (DRAPES) ×2 IMPLANT
DRESSING PREVENA PLUS CUSTOM (GAUZE/BANDAGES/DRESSINGS) ×1 IMPLANT
DRSG PREVENA PLUS CUSTOM (GAUZE/BANDAGES/DRESSINGS) ×2
DURAPREP 26ML APPLICATOR (WOUND CARE) ×2 IMPLANT
ELECT REM PT RETURN 9FT ADLT (ELECTROSURGICAL) ×2
ELECTRODE REM PT RTRN 9FT ADLT (ELECTROSURGICAL) ×1 IMPLANT
GLOVE BIOGEL PI IND STRL 9 (GLOVE) ×1 IMPLANT
GLOVE BIOGEL PI INDICATOR 9 (GLOVE) ×1
GLOVE SURG ORTHO 9.0 STRL STRW (GLOVE) ×2 IMPLANT
GOWN STRL REUS W/ TWL XL LVL3 (GOWN DISPOSABLE) ×2 IMPLANT
GOWN STRL REUS W/TWL XL LVL3 (GOWN DISPOSABLE) ×2
GRAFT SKIN WND MICRO 38 (Tissue) ×1 IMPLANT
GRAFT SKIN WND OMEGA3 SB 7X10 (Tissue) ×1 IMPLANT
KIT BASIN OR (CUSTOM PROCEDURE TRAY) ×2 IMPLANT
KIT TURNOVER KIT B (KITS) ×2 IMPLANT
MANIFOLD NEPTUNE II (INSTRUMENTS) ×2 IMPLANT
NS IRRIG 1000ML POUR BTL (IV SOLUTION) ×2 IMPLANT
PACK ORTHO EXTREMITY (CUSTOM PROCEDURE TRAY) ×2 IMPLANT
PAD ARMBOARD 7.5X6 YLW CONV (MISCELLANEOUS) ×2 IMPLANT
PREVENA RESTOR ARTHOFORM 46X30 (CANNISTER) ×2 IMPLANT
SPONGE T-LAP 18X18 ~~LOC~~+RFID (SPONGE) IMPLANT
STAPLER VISISTAT 35W (STAPLE) IMPLANT
STOCKINETTE IMPERVIOUS LG (DRAPES) ×2 IMPLANT
SUT ETHILON 2 0 PSLX (SUTURE) IMPLANT
SUT SILK 2 0 (SUTURE) ×1
SUT SILK 2-0 18XBRD TIE 12 (SUTURE) ×1 IMPLANT
SUT VIC AB 1 CTX 27 (SUTURE) ×4 IMPLANT
TOWEL GREEN STERILE (TOWEL DISPOSABLE) ×2 IMPLANT
TUBE CONNECTING 12X1/4 (SUCTIONS) ×2 IMPLANT
YANKAUER SUCT BULB TIP NO VENT (SUCTIONS) ×2 IMPLANT

## 2022-04-04 NOTE — Anesthesia Postprocedure Evaluation (Signed)
Anesthesia Post Note ? ?Patient: Kerry Randall ? ?Procedure(s) Performed: RIGHT BELOW KNEE AMPUTATION (Right: Knee) ? ?  ? ?Patient location during evaluation: PACU ?Anesthesia Type: Regional ?Level of consciousness: awake and alert ?Pain management: pain level controlled ?Vital Signs Assessment: post-procedure vital signs reviewed and stable ?Respiratory status: spontaneous breathing, nonlabored ventilation and respiratory function stable ?Cardiovascular status: blood pressure returned to baseline and stable ?Postop Assessment: no apparent nausea or vomiting ?Anesthetic complications: no ? ? ?No notable events documented. ? ?Last Vitals:  ?Vitals:  ? 04/04/22 1339 04/04/22 1606  ?BP: 136/65 109/87  ?Pulse: 62 80  ?Resp: 13 18  ?Temp: 36.4 ?C (!) 36.4 ?C  ?SpO2: 96% 97%  ?  ?Last Pain:  ?Vitals:  ? 04/04/22 1606  ?TempSrc: Oral  ?PainSc:   ? ? ?  ?  ?  ?  ?  ?  ? ?Lucretia Kern ? ? ? ? ?

## 2022-04-04 NOTE — Progress Notes (Signed)
PHARMACIST LIPID MONITORING ? ? ?Kerry Randall is a 61 y.o. female admitted on 03/29/2022 with wound infection.  Pharmacy has been consulted to optimize lipid-lowering therapy with the indication of secondary prevention for clinical ASCVD. ? ?Recent Labs: ? ?Lipid Panel (last 6 months):   ?Lab Results  ?Component Value Date  ? CHOL 72 04/03/2022  ? TRIG 65 04/03/2022  ? HDL 36 (L) 04/03/2022  ? CHOLHDL 2.0 04/03/2022  ? VLDL 13 04/03/2022  ? LDLCALC 23 04/03/2022  ? ? ?Hepatic function panel (last 6 months):   ?Lab Results  ?Component Value Date  ? AST 11 (L) 04/04/2022  ? ALT 11 04/04/2022  ? ALKPHOS 64 04/04/2022  ? BILITOT 0.5 04/04/2022  ? ? ?SCr (since admission):   ?Serum creatinine: 0.98 mg/dL 16/10/96 0454 ?Estimated creatinine clearance: 71.1 mL/min ? ?Current therapy and lipid therapy tolerance ?Current lipid-lowering therapy: atorvastatin 40mg  ?Previous lipid-lowering therapies (if applicable): n/a ?Documented or reported allergies or intolerances to lipid-lowering therapies (if applicable): crestor ? ?Assessment:   ?Patient on high intensity statin with LDL below goal. No changes at this time.  ? ?Plan:   ? ?1.Statin intensity (high intensity recommended for all patients regardless of the LDL):  No statin changes. The patient is already on a high intensity statin. ? ?2.Add ezetimibe (if any one of the following):   Not indicated at this time. ? ?3.Refer to lipid clinic:   No ? ?4.Follow-up with:  Primary care provider - [facility] ? ?5.Follow-up labs after discharge:  No changes in lipid therapy, repeat a lipid panel in one year.    ? ?[place] PharmD., BCPS ?Clinical Pharmacist ?04/04/2022 8:51 AM ? ?

## 2022-04-04 NOTE — Anesthesia Procedure Notes (Signed)
Anesthesia Regional Block: Popliteal block  ? ?Pre-Anesthetic Checklist: , timeout performed,  Correct Patient, Correct Site, Correct Laterality,  Correct Procedure, Correct Position, site marked,  Risks and benefits discussed,  Surgical consent,  Pre-op evaluation,  At surgeon's request and post-op pain management ? ?Laterality: Right ? ?Prep: chloraprep     ?  ?Needles:  ?Injection technique: Single-shot ? ?Needle Type: Echogenic Stimulator Needle   ? ? ?Needle Length: 10cm  ?Needle Gauge: 20  ? ? ? ?Additional Needles: ? ? ?Procedures:,,,, ultrasound used (permanent image in chart),,    ?Narrative:  ?Start time: 04/04/2022 11:24 AM ?End time: 04/04/2022 11:26 AM ? ?Performed by: Personally  ?Anesthesiologist: Lucretia Kern, MD ? ?Additional Notes: ?Standard monitors applied. Skin prepped. Good needle visualization with ultrasound. Injection made in 5cc increments with no resistance to injection. Patient tolerated the procedure well. ? ? ? ? ?

## 2022-04-04 NOTE — Progress Notes (Signed)
? ?  Inpatient Rehabilitation Admissions Coordinator  ? ?[employer] payor trends do not support approval for [facility] for this diagnosis. Recommend other rehab venues to be pursued. We will signoff. ? ?Ottie Glazier, RN, MSN ?Rehab Admissions Coordinator ?([phone] ?04/04/2022 3:59 PM ? ?

## 2022-04-04 NOTE — Anesthesia Procedure Notes (Signed)
Anesthesia Regional Block: Adductor canal block  ? ?Pre-Anesthetic Checklist: , timeout performed,  Correct Patient, Correct Site, Correct Laterality,  Correct Procedure, Correct Position, site marked,  Risks and benefits discussed,  Surgical consent,  Pre-op evaluation,  At surgeon's request and post-op pain management ? ?Laterality: Right ? ?Prep: chloraprep     ?  ?Needles:  ?Injection technique: Single-shot ? ?Needle Type: Echogenic Stimulator Needle   ? ? ?Needle Length: 10cm  ?Needle Gauge: 20  ? ? ? ?Additional Needles: ? ? ?Procedures:,,,, ultrasound used (permanent image in chart),,    ?Narrative:  ?Start time: 04/04/2022 11:21 AM ?End time: 04/04/2022 11:24 AM ?Injection made incrementally with aspirations every 5 mL. ? ?Performed by: Personally  ?Anesthesiologist: Lidia Collum, MD ? ?Additional Notes: ?Standard monitors applied. Skin prepped. Good needle visualization with ultrasound. Injection made in 5cc increments with no resistance to injection. Patient tolerated the procedure well. ? ? ? ? ?

## 2022-04-04 NOTE — Progress Notes (Signed)
?      ?                 PROGRESS NOTE ? ?      ?PATIENT DETAILS ?Name: Kerry Randall ?Age: 61 y.o. ?Sex: female ?Date of Birth: 1961/02/10 ?Admit Date: 03/29/2022 ?Admitting Physician Vianne Bulls, MD ?OX:[id number], [place] ? ?Brief Summary: ?Patient is a 61 y.o.  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  See below for further details. ? ?Significant events: ?5/4>> admit to [facility] for necrotic right TMA stump. ? 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 ?5/8 >> bilateral common iliac artery stenting   ? ?Significant microbiology data: ?5/4>> COVID/influenza PCR: Negative ?5/4>> blood culture: No growth ? ?Procedures: ?None ? ?Consults: ?Orthopedics, vascular surgery ? ?Subjective:  Patient in bed, appears comfortable, denies any headache, no fever, no chest pain or pressure, no shortness of breath , no abdominal pain. No new focal weakness. ? ? ? ?Objective: ?Vitals: ?Blood pressure 113/70, pulse 71, temperature 98 ?F (36.7 ?C), temperature source Oral, resp. rate 17, height 5\' 9"  (1.753 m), weight 92.4 kg, SpO2 100 %.  ? ?Exam: ? ?Awake Alert, No new F.N deficits, Normal affect ?Kerry Randall.AT,PERRAL ?Supple Neck, No JVD,   ?Symmetrical Chest wall movement, Good air movement bilaterally, CTAB ?RRR,No Gallops, Rubs or new Murmurs,  ?+ve B.Sounds, Abd Soft, No tenderness,   ?Right TMA stump under bandage. ? ? ?Assessment/Plan: ? ?Necrotic/nonhealing right TMA stump/critical right limb ischemia:  Vascular surgery/orthopedics following - for now Continue empiric IV antibiotics she is s/p  bilateral common iliac artery stenting by vascular surgeon Dr. Standley Dakins on 04/02/2022, Dr. Lucia Gaskins orthopedics is following the patient and contemplating right BKA on 04/04/2022, patient  agreeable for blood transfusion during the perioperative period. ? ?PAF: Rate controlled-continue metoprolol-Eliquis on hold for possible angiogram Monday-in the interim-we will start IV heparin.  CHA2DS2-VASc of at least 4.  As needed IV Lopressor added. ? ?PAD-s/p right common iliac artery stent June 2022-angioplasty of distal right SFA: Now status post Bilateral common iliac artery stenting on 04/02/2022, she is currently on aspirin and statin for secondary prevention.  Note she will also go back on Eliquis after surgical procedures are done ? ?CAD s/p PCI to RCA 2016: No anginal symptoms-continue aspirin statin and anticoagulation as above.  No acute issues. ? ?Peripheral neuropathy: Continue Neurontin/Cymbalta ? ?CKD stage IIIa: Creatinine close to baseline-follow periodically. ? ?Normocytic anemia: Mild-likely due to combination of acute illness/CKD-follow CBC periodically. ? ?DM-2 (A1c 7.0 on 5/5): CBGs relatively stable-continue 5 units of Semglee and SSI.  Follow and adjust.  Include D5W drip while she is n.p.o. for surgery on 04/04/2022 with every 2 hour Accu-Cheks. ? ?Recent Labs  ?  04/03/22 ?2353 04/04/22 ?0408 04/04/22 ?0741  ?GLUCAP 239* 205* 128*  ? ? ?Pressure Ulcer: ?Pressure Injury 03/30/22 Coccyx Stage 2 -  Partial thickness loss of dermis presenting as a shallow open injury with a red, pink wound bed without slough. (Active)  ?03/30/22 2200  ?Location: Coccyx  ?Location Orientation:   ?Staging: Stage 2 -  Partial thickness loss of dermis presenting as a shallow open injury with a red, pink wound bed without slough.  ?Wound Description (Comments):   ?Present on Admission: Yes  ?Dressing Type Foam - Lift dressing to assess site every  shift 04/03/22 1246  ? ?Obesity: ?Estimated body mass index is 30.08 kg/m? as calculated from the following: ?  Height as of this encounter: 5\' 9"  (1.753 m). ?  Weight as of this encounter: 92.4 kg.  ? ?Code status: ?  Code Status: Full Code  ? ?DVT Prophylaxis ?Place  and maintain sequential compression device Start: 03/29/22 2235 ?Starting IV heparin 5/6. ?  ?Family Communication: She prefers to update family herself-I have asked her to let us know if she changes her mind or if her family members have questions. ? ? ?Disposition Plan: ?Status is: Inpatient ?Remains inpatient appropriate because: Necrotic right TMA stump/critical limb ischemia-not stable for discharge. ?  ?Planned Discharge Destination:Home health ? ? ?Diet: ?Diet Order   ? ?       ?  Diet NPO time specified  Diet effective ____       ?  ? ?  ?  ? ?  ?  ? ? ?MEDICATIONS: ?Scheduled Meds: ? acetaminophen  1,000 mg Oral Once  ? aspirin EC  81 mg Oral Daily  ? atorvastatin  40 mg Oral Daily  ? DULoxetine  60 mg Oral BID  ? feeding supplement  237 mL Oral BID BM  ? fesoterodine  8 mg Oral Daily  ? gabapentin  1,200 mg Oral BID  ? hydrOXYzine  75 mg Oral Daily  ? insulin aspart  0-9 Units Subcutaneous Q4H  ? insulin glargine-yfgn  8 Units Subcutaneous Daily  ? metoprolol succinate  25 mg Oral Daily  ? mirabegron ER  25 mg Oral Daily  ? nutrition supplement (JUVEN)  1 packet Oral BID BM  ? pantoprazole  40 mg Oral Daily  ? polyethylene glycol  17 g Oral Daily  ? povidone-iodine  2 application. Topical Once  ? sodium chloride flush  3 mL Intravenous Q12H  ? ?Continuous Infusions: ? sodium chloride    ? aztreonam 2 g (04/04/22 0651)  ? dextrose 5 % and 0.45% NaCl    ? heparin 1,950 Units/hr (04/04/22 0816)  ? metronidazole Stopped (04/04/22 0543)  ? tranexamic acid    ? vancomycin Stopped (04/04/22 0016)  ? ?PRN Meds:.sodium chloride, acetaminophen **OR** acetaminophen, acetaminophen, albuterol, fentaNYL (SUBLIMAZE) injection, hydrALAZINE, metoprolol tartrate, ondansetron (ZOFRAN) IV ? ? ?I have personally reviewed following labs and imaging studies ? ?LABORATORY DATA: ? ?Recent Labs  ?Lab 03/29/22 ?1427 03/30/22 ?0400 03/31/22 ?0031 04/01/22 ?0335 04/02/22 ?[id number] 04/03/22 ?0109 04/04/22 ?0244  ?WBC 13.6*   < > 13.0*  11.6* 8.8 9.4 9.7  ?HGB 11.9*   < > 11.1* 10.6* 10.9* 10.1* 9.6*  ?HCT 36.9   < > 34.3* 32.5* 34.1* 30.6* 30.0*  ?PLT 266   < > 222 193 192 190 172  ?MCV 90.2   < > 90.7 90.0 90.2 88.7 90.1  ?MCH 29.1   < > 29.4 29.4 28.8 29.3 28.8  ?MCHC 32.2   < > 32.4 32.6 32.0 33.0 32.0  ?RDW 14.0   < > 13.9 13.6 13.6 13.4 13.6  ?LYMPHSABS 2.5  --   --   --  1.7 0.8 2.7  ?MONOABS 1.1*  --   --   --  0.8 0.8 0.9  ?EOSABS 0.2  --   --   --  0.4 0.0 0.3  ?BASOSABS 0.1  --   --   --  0.0 0.0 0.0  ? < > = values in this interval not displayed.  ? ? ?Recent Labs  ?Lab 03/29/22 ?1427 03/29/22 ?2142 03/30/22 ?  0400 03/31/22 ?0031 04/01/22 ?0335 04/02/22 ?[id number] 04/03/22 ?0109 04/04/22 ?0244  ?NA 137  --  139 137 137 138 136 139  ?K 4.2  --  4.0 4.7 4.6 3.7 4.1 3.8  ?CL 110  --  110 108 108 111 110 112*  ?CO2 20*  --  19* 24 21* 23 20* 20*  ?GLUCOSE 296*  --  203* 225* 175* 168* 304* 206*  ?BUN 17  --  14 16 18 21  24* 31*  ?CREATININE 1.08*  --  0.92 1.05* 0.98 1.00 0.96 0.98  ?CALCIUM 8.6*  --  8.7* 8.7* 8.7* 8.8* 8.5* 8.6*  ?AST 9*  --   --   --   --  11* 10* 11*  ?ALT 10  --   --   --   --  9 11 11   ?ALKPHOS 89  --   --   --   --  84 76 64  ?BILITOT 0.5  --   --   --   --  0.2* 0.6 0.5  ?ALBUMIN 2.6*  --   --   --   --  2.2* 2.1* 2.1*  ?MG  --   --   --   --   --  1.8 1.7 1.6*  ?CRP  --  17.7* 18.0* 16.2*  --   --   --   --   ?LATICACIDVEN 1.5  --   --   --   --   --   --   --   ?HGBA1C  --   --  7.0*  --   --   --   --   --   ?BNP  --   --   --   --   --  243.6* 474.1* 236.9*  ? ?RADIOLOGY STUDIES/RESULTS: ?PERIPHERAL VASCULAR CATHETERIZATION ? ?Result Date: 04/02/2022 ?DATE OF SERVICE: 04/02/2022  PATIENT:  Kerry Randall  61 y.o. female  PRE-OPERATIVE DIAGNOSIS:  Atherosclerosis of native arteries of right lower extremity causing ulceration  POST-OPERATIVE DIAGNOSIS:  Occlusion of right common iliac artery stent  PROCEDURE:  1) ultrasound guided bilateral common femoral artery access 2) Aortogram 3) Bilateral common iliac artery stenting  (7x47mm VBX x2) 4) Right lower extremity angiogram (116mL total contrast) 5) Conscious sedation (89 minutes)  SURGEON:  Yevonne Aline. Stanford Breed, MD  ASSISTANT: none  ANESTHESIA:   local and IV sedation  ESTIMATED BLOOD LO

## 2022-04-04 NOTE — Transfer of Care (Signed)
Immediate Anesthesia Transfer of Care Note ? ?Patient: Kerry Randall ? ?Procedure(s) Performed: RIGHT BELOW KNEE AMPUTATION (Right: Knee) ? ?Patient Location: PACU ? ?Anesthesia Type:MAC and Regional ? ?Level of Consciousness: drowsy ? ?Airway & Oxygen Therapy: Patient Spontanous Breathing and Patient connected to nasal cannula oxygen ? ?Post-op Assessment: Report given to RN and Post -op Vital signs reviewed and stable ? ?Post vital signs: Reviewed and stable ? ?Last Vitals:  ?Vitals Value Taken Time  ?BP 124/54 04/04/22 1300  ?Temp 36.8 ?C 04/04/22 1300  ?Pulse 97 04/04/22 1300  ?Resp 19 04/04/22 1300  ?SpO2 93 % 04/04/22 1300  ? ? ?Last Pain:  ?Vitals:  ? 04/04/22 1140  ?TempSrc:   ?PainSc: 0-No pain  ?   ? ?Patients Stated Pain Goal: 7 (04/02/22 0221) ? ?Complications: No notable events documented. ?

## 2022-04-04 NOTE — Progress Notes (Signed)
Pt back from OR. Right BKA with vac placement , pt currently in no pain  ? ? ? 04/04/22 1339  ?Vitals  ?Temp 97.6 ?F (36.4 ?C)  ?Temp Source Oral  ?BP 136/65  ?MAP (mmHg) 80  ?BP Location Right Arm  ?BP Method Automatic  ?Patient Position (if appropriate) Lying  ?Pulse Rate 62  ?Pulse Rate Source Monitor  ?ECG Heart Rate 62  ?Resp 13  ?Level of Consciousness  ?Level of Consciousness Alert  ?Oxygen Therapy  ?SpO2 96 %  ?O2 Device Room Air  ?O2 Flow Rate (L/min) 0 L/min  ?Pain Assessment  ?Pain Scale 0-10  ?Pain Score 0  ? ? ?

## 2022-04-04 NOTE — Progress Notes (Signed)
ANTICOAGULATION CONSULT NOTE  ?Pharmacy Consult for heparin ?Indication: atrial fibrillation ? ?Patient Measurements: ?Height: 5\' 9"  (175.3 cm) ?Weight: 92.4 kg (203 lb 11.3 oz) ?IBW/kg (Calculated) : 66.2 ?Heparin Dosing Weight: 86.4 kg ? ?Vital Signs: ?Temp: 98 ?F (36.7 ?C) (05/10 11-09-1982) ?Temp Source: Oral (05/10 11-09-1982) ?BP: 113/70 (05/10 11-09-1982) ?Pulse Rate: 71 (05/10 0803) ? ?Labs: ?Recent Labs  ?  04/02/22 ?0156 04/03/22 ?0109 04/03/22 ?1054 04/04/22 ?0244  ?HGB 10.9* 10.1*  --  9.6*  ?HCT 34.1* 30.6*  --  30.0*  ?PLT 192 190  --  172  ?HEPARINUNFRC 0.33 0.20* 0.51 0.40  ?CREATININE 1.00 0.96  --  0.98  ? ?Estimated Creatinine Clearance: 71.1 mL/min (by C-G formula based on SCr of 0.98 mg/dL). ? ?Assessment: ?61 y.o. female admitted on 5/4 for diabetic foot infection and PMH significant for Afib on PTA Eliquis. Unsure of last known dose, per patient report 3-4 days before admission. Pharmacy consulted to dose IV heparin. ? ?Heparin level at goal this morning on 1950 units/hr of heparin. No bleeding or IV issues noted. Ortho is planning transtibial amputation today.  ? ?Goal of Therapy:  ?Heparin level 0.3-0.7 units/mL ?Monitor platelets by anticoagulation protocol: Yes ?  ?Plan:  ?Continue heparin at 1950 units/hr ?Check heparin level with am labs ? ?7/4 PharmD., BCPS ?Clinical Pharmacist ?04/04/2022 8:43 AM ? ?

## 2022-04-04 NOTE — Consult Note (Signed)
? ?ORTHOPAEDIC CONSULTATION ? ?REQUESTING PHYSICIAN: Leroy Sea, MD ? ?Chief Complaint: Necrotic ulcer right foot. ? ?HPI: ?Kerry Randall is a 61 y.o. female who presents with diabetic insensate neuropathy protein caloric malnutrition with a gangrenous necrotic ulcer involving the plantar aspect of the right transmetatarsal amputation.  Patient is status post revascularization to the right common iliac artery to the right. ? ?Past Medical History:  ?Diagnosis Date  ? Atrial fibrillation (HCC)   ? Colon polyps   ? Diabetes mellitus without complication (HCC)   ? GERD (gastroesophageal reflux disease)   ? Heart disease, hyperkinetic   ? Hypercholesteremia   ? Hypertension   ? Kidney disease   ? Leg pain   ? Major depressive disorder, recurrent (HCC)   ? Palpitations   ? Vitamin D deficiency   ? ?Past Surgical History:  ?Procedure Laterality Date  ? ABDOMINAL AORTOGRAM W/LOWER EXTREMITY N/A 04/26/2021  ? Procedure: ABDOMINAL AORTOGRAM W/LOWER EXTREMITY;  Surgeon: Iran Ouch, MD;  Location: [facility];  Service: Cardiovascular;  Laterality: N/A;  ? ABDOMINAL AORTOGRAM W/LOWER EXTREMITY Right 04/02/2022  ? Procedure: ABDOMINAL AORTOGRAM W/LOWER EXTREMITY;  Surgeon: Leonie Douglas, MD;  Location: [facility];  Service: Vascular;  Laterality: Right;  ? AMPUTATION TOE Bilateral   ? small toe on left foot; all toes on right foot  ? PERIPHERAL VASCULAR BALLOON ANGIOPLASTY Right 04/26/2021  ? Procedure: PERIPHERAL VASCULAR BALLOON ANGIOPLASTY;  Surgeon: Iran Ouch, MD;  Location: [facility];  Service: Cardiovascular;  Laterality: Right;  superficial femoral  ? PERIPHERAL VASCULAR INTERVENTION Right 04/26/2021  ? Procedure: PERIPHERAL VASCULAR INTERVENTION;  Surgeon: Iran Ouch, MD;  Location: [facility];  Service: Cardiovascular;  Laterality: Right;  Iliac stent  ? PERIPHERAL VASCULAR INTERVENTION Bilateral 04/02/2022  ? Procedure: PERIPHERAL VASCULAR INTERVENTION;  Surgeon:  Leonie Douglas, MD;  Location: [facility];  Service: Vascular;  Laterality: Bilateral;  common iliac  ? TOTAL VAGINAL HYSTERECTOMY  12/1998  ? ?Social History  ? ?Socioeconomic History  ? Marital status: Single  ?  Spouse name: Not on file  ? Number of children: Not on file  ? Years of education: Not on file  ? Highest education level: Not on file  ?Occupational History  ? Not on file  ?Tobacco Use  ? Smoking status: Smoker, Current Status Unknown  ? Smokeless tobacco: Never  ?Substance and Sexual Activity  ? Alcohol use: Not on file  ? Drug use: Not on file  ? Sexual activity: Not on file  ?Other Topics Concern  ? Not on file  ?Social History Narrative  ? Not on file  ? ?Social Determinants of Health  ? ?Financial Resource Strain: Not on file  ?Food Insecurity: Not on file  ?Transportation Needs: Not on file  ?Physical Activity: Not on file  ?Stress: Not on file  ?Social Connections: Not on file  ? ?Family History  ?Problem Relation Age of Onset  ? Hypertension Mother   ? Diabetes type II Mother   ? Heart disease Mother   ? Throat cancer Father   ? Hypertension Sister   ? Depression Sister   ? Brain cancer Sister   ? Hypertension Maternal Aunt   ? Hyperlipidemia Maternal Aunt   ? Hypertension Maternal Uncle   ? Hyperlipidemia Maternal Uncle   ? Hypertension Paternal Aunt   ? Hyperlipidemia Paternal Aunt   ? Heart disease Paternal Aunt   ? Hypertension Paternal Uncle   ?  Hyperlipidemia Paternal Uncle   ? Diabetes type II Daughter   ? Depression Daughter   ? Anxiety disorder Daughter   ? Lupus Paternal Grandmother   ? ?- negative except otherwise stated in the family history section ?Allergies  ?Allergen Reactions  ? Erythromycin Hives  ? Iodinated Contrast Media Swelling  ? Levofloxacin Hives and Swelling  ? Oxycodone-Acetaminophen Anaphylaxis  ?  "Percocet" ?"Percocet" ?  ? Penicillins Anaphylaxis  ?  Hypotension ?  ? Rosuvastatin Rash, Hives and Other (See Comments)  ?  Muscle Cramps and weakness ?  ?  Sulfa Antibiotics Hives  ? ?Prior to Admission medications   ?Medication Sig Start Date End Date Taking? Authorizing Provider  ?albuterol (VENTOLIN HFA) 108 (90 Base) MCG/ACT inhaler Inhale 1-2 puffs into the lungs every 6 (six) hours as needed for wheezing or shortness of breath.   Yes [provider]  ?apixaban (ELIQUIS) 5 MG TABS tablet Take 5 mg by mouth 2 (two) times daily. 03/06/21  Yes [provider]  ?ascorbic acid (VITAMIN C) 500 MG tablet Take 500 mg by mouth in the morning. 06/27/17  Yes [provider]  ?B Complex-C (B-COMPLEX WITH VITAMIN C) tablet Take 1 tablet by mouth 3 (three) times a week. (on cleaning days)   Yes [provider]  ?Biotin 5000 MCG TABS Take 5,000 mcg by mouth in the morning.   Yes [provider]  ?dapagliflozin propanediol (FARXIGA) 10 MG TABS tablet Take 10 mg by mouth in the morning.   Yes [provider]  ?DULoxetine (CYMBALTA) 60 MG capsule Take 60 mg by mouth 2 (two) times daily. 08/31/20  Yes [provider]  ?ferrous sulfate 325 (65 FE) MG tablet Take 325 mg by mouth in the morning.   Yes [provider]  ?fesoterodine (TOVIAZ) 8 MG TB24 tablet Take 8 mg by mouth in the morning. 03/11/15  Yes [provider]  ?gabapentin (NEURONTIN) 600 MG tablet Take 1,200 mg by mouth 2 (two) times daily. 03/17/15  Yes [provider]  ?gemfibrozil (LOPID) 600 MG tablet Take 600 mg by mouth 2 (two) times daily. 10/14/20  Yes [provider]  ?Glucosamine-Chondroitin (COSAMIN DS PO) Take 2 tablets by mouth in the morning.   Yes [provider]  ?hydrOXYzine (ATARAX/VISTARIL) 25 MG tablet Take 75 mg by mouth in the morning. 02/20/21  Yes [provider]  ?insulin glargine (LANTUS) 100 UNIT/ML Solostar Pen Inject 17 Units into the skin daily. 03/04/20  Yes [provider]  ?lisinopril (ZESTRIL) 30 MG tablet Take 30 mg by mouth in the morning. 08/02/20  Yes [provider]  ?magnesium oxide (MAG-OX) 400 MG tablet Take 400 mg by mouth in the morning.   Yes [provider]  ?metoprolol succinate (TOPROL-XL) 50 MG 24 hr tablet Take 50 mg by mouth in the morning. 10/14/20  Yes [provider]  ?mirabegron ER (MYRBETRIQ) 25 MG TB24 tablet Take 25 mg by mouth in the morning. 09/20/20  Yes [provider]  ?nicotine (NICODERM CQ - DOSED IN MG/24 HOURS) 14 mg/24hr patch Place 1 patch (14 mg total) onto the skin daily. 04/28/21  Yes Barrett, Joline Salthonda G, PA-C  ?Omega-3 Fatty Acids (FISH OIL) 1000 MG CAPS Take 1,000 mg by mouth every other day. In the morning   Yes [provider]  ?pantoprazole (PROTONIX) 40 MG tablet Take 40 mg by mouth in the morning. 02/15/21  Yes [provider]  ?REPATHA SURECLICK 140 MG/ML SOAJ Inject 140  mg into the skin every 14 (fourteen) days. 03/27/21  Yes [provider]  ?triamcinolone ointment (KENALOG) 0.5 % Apply 1 application topically 2 (two) times daily as needed (sores/skin irritation).   Yes [provider]  ?VITAMIN A PO Take 3,000 mcg by mouth every other day. In the morning   Yes [provider]  ?Vitamin E 450 MG (1000 UT) CAPS Take 1,000 Units by mouth in the morning.   Yes [provider]  ?Brimonidine Tartrate (LUMIFY) 0.025 % SOLN Place 1 drop into both eyes daily as needed (tired eyes). ?Patient not taking: Reported on 03/29/2022    [provider]  ?Dulaglutide (TRULICITY) 3 MG/0.5ML SOPN Inject 3 mg into the skin every Tuesday.    [provider]  ?lipase/protease/amylase (CREON) 36000 UNITS CPEP capsule Take 36,000-72,000 Units by mouth See admin instructions. ?Patient not taking: Reported on 03/29/2022    [provider]  ?metFORMIN (GLUCOPHAGE-XR) 500 MG 24 hr tablet Take 1,000 mg by mouth 2 (two) times daily. ?Patient not taking: Reported on 03/29/2022 11/13/19   [provider]  ?nitroGLYCERIN (NITROSTAT) 0.4 MG SL tablet Place 0.4 mg  under the tongue every 5 (five) minutes x 3 doses as needed for chest pain. 06/20/20   [provider]  ?traMADol (ULTRAM) 50 MG tablet Take 1 tablet (50 mg total) by mouth every 6 (six) hours a

## 2022-04-04 NOTE — Progress Notes (Signed)
ANTICOAGULATION CONSULT NOTE  ?Pharmacy Consult for heparin ?Indication: atrial fibrillation ? ?Patient s/p amputation today 5/10. Ortho would like to hold off on restarting heparin until patient assessed tomorrow.  ? ? ?Sheppard Coil PharmD., BCPS ?Clinical Pharmacist ?04/04/2022 2:01 PM ? ?

## 2022-04-04 NOTE — Anesthesia Preprocedure Evaluation (Addendum)
Anesthesia Evaluation  ?Patient identified by MRN, date of birth, ID band ?Patient awake ? ? ? ?Reviewed: ?Allergy & Precautions, NPO status , Patient's Chart, lab work & pertinent test results ? ?History of Anesthesia Complications ?Negative for: history of anesthetic complications ? ?Airway ?Mallampati: II ? ?TM Distance: >3 FB ?Neck ROM: Full ? ? ? Dental ? ?(+) Edentulous Upper ?  ?Pulmonary ?neg pulmonary ROS,  ?  ?Pulmonary exam normal ? ? ? ? ? ? ? Cardiovascular ?hypertension, + CAD and + Peripheral Vascular Disease  ?Normal cardiovascular exam+ dysrhythmias Atrial Fibrillation  ? ? ?  ?Neuro/Psych ?negative neurological ROS ?   ? GI/Hepatic ?Neg liver ROS, GERD  ,  ?Endo/Other  ?negative endocrine ROSdiabetes, Insulin Dependent ? Renal/GU ?CRFRenal disease (CKD)  ?negative genitourinary ?  ?Musculoskeletal ?negative musculoskeletal ROS ?(+)  ? Abdominal ?  ?Peds ? Hematology ? ?(+) Blood dyscrasia, anemia ,   ?Anesthesia Other Findings ? ? Reproductive/Obstetrics ? ?  ? ? ? ? ? ? ? ? ? ? ? ? ? ?  ?  ? ? ? ? ? ? ? ?Anesthesia Physical ?Anesthesia Plan ? ?ASA: 3 ? ?Anesthesia Plan: MAC and Regional  ? ?Post-op Pain Management: Regional block* and Tylenol PO (pre-op)*  ? ?Induction: Intravenous ? ?PONV Risk Score and Plan: Propofol infusion, TIVA and Treatment may vary due to age or medical condition ? ?Airway Management Planned: Natural Airway, Nasal Cannula and Simple Face Mask ? ?Additional Equipment: None ? ?Intra-op Plan:  ? ?Post-operative Plan:  ? ?Informed Consent: I have reviewed the patients History and Physical, chart, labs and discussed the procedure including the risks, benefits and alternatives for the proposed anesthesia with the patient or authorized representative who has indicated his/her understanding and acceptance.  ? ? ? ? ? ?Plan Discussed with:  ? ?Anesthesia Plan Comments:   ? ? ? ? ? ? ?Anesthesia Quick Evaluation ? ?

## 2022-04-04 NOTE — Progress Notes (Signed)
Pharmacy Antibiotic Note ? ?Kerry Randall is a 61 y.o. female admitted on 03/29/2022 with  wound infection .  MRI with concern for chronic OM. Pharmacy has been consulted for Vancomycin and Aztreonam dosing. ? ?Patient remains afebrile. Renal function has slightly improved since admission, Cr today 0.98, eCrCl ~70 ml/min.  ? ?5/4 BCX with ng. ? ?Plan for amputation today. If antibiotics are to continue post-op will need to obtain levels.  ? ?Plan: ?Continue vancomycin 1250 mg IV Q24h (eAUC 489, Scr 0.98, Vd 0.5, goal AUC 400-550) ?Continue aztreonam 2 g IV q8h  ?Continue metronidazole 500 mg IV q12h ?Follow-up clinical status, renal function ?Follow-up cultures, LOT, narrow as able  ?No levels ordered at this time. Anticipate vancomycin DC'd after amputation  ? ?Temp (24hrs), Avg:97.9 ?F (36.6 ?C), Min:97.7 ?F (36.5 ?C), Max:98.2 ?F (36.8 ?C) ? ?Recent Labs  ?Lab 03/29/22 ?1427 03/30/22 ?0400 03/31/22 ?0031 04/01/22 ?0335 04/02/22 ?7793 04/03/22 ?0109 04/04/22 ?0244  ?WBC 13.6*   < > 13.0* 11.6* 8.8 9.4 9.7  ?CREATININE 1.08*   < > 1.05* 0.98 1.00 0.96 0.98  ?LATICACIDVEN 1.5  --   --   --   --   --   --   ? < > = values in this interval not displayed.  ? ?  ?Estimated Creatinine Clearance: 71.1 mL/min (by C-G formula based on SCr of 0.98 mg/dL).   ? ?Allergies  ?Allergen Reactions  ? Erythromycin Hives  ? Iodinated Contrast Media Swelling  ? Levofloxacin Hives and Swelling  ? Oxycodone-Acetaminophen Anaphylaxis  ?  "Percocet" ?"Percocet" ?  ? Penicillins Anaphylaxis  ?  Hypotension ?  ? Rosuvastatin Rash, Hives and Other (See Comments)  ?  Muscle Cramps and weakness ?  ? Sulfa Antibiotics Hives  ? ? ?Antimicrobials this admission: ?Vancomycin 5/4 >>  ?Aztreonam 5/4 >>  ?Flagyl 5/4 >>  ? ?Microbiology results: ?5/4 BCx: ng ? ? ?Thank you for allowing pharmacy to be a part of this patient?s care. ? ?Sheppard Coil PharmD., BCPS ?Clinical Pharmacist ?04/04/2022 8:48 AM ? ?

## 2022-04-04 NOTE — Op Note (Signed)
04/04/2022 ? ?1:13 PM ? ?PATIENT:  Kerry Randall   ? ?PRE-OPERATIVE DIAGNOSIS:  Gngrene Right Foot ? ?POST-OPERATIVE DIAGNOSIS:  Same ? ?PROCEDURE:  RIGHT BELOW KNEE AMPUTATION ?Application of biologic tissue graft Kerecis 70 cm? sheet and 38 cm? micro. ?Application of wound VAC customizable and Arnell Sieving form. ? ? ?SURGEON:  Newt Minion, MD ? ?ANESTHESIA:   General ? ?PREOPERATIVE INDICATIONS:  Kerry Randall is a  62 y.o. female with a diagnosis of Gngrene Right Foot who failed conservative measures and elected for surgical management.   ? ?The risks benefits and alternatives were discussed with the patient preoperatively including but not limited to the risks of infection, bleeding, nerve injury, cardiopulmonary complications, the need for revision surgery, among others, and the patient was willing to proceed. ? ?OPERATIVE IMPLANTS: Kerecis tissue graft 70 cm? sheet and 38 cm? micro powder ? ? ?OPERATIVE FINDINGS: Good petechial bleeding. ? ?OPERATIVE PROCEDURE: Patient was brought to the operating room after undergoing a regional anesthetic.  After adequate levels anesthesia were obtained a thigh tourniquet was placed and the lower extremity was prepped using DuraPrep draped into a sterile field. The foot was draped out of the sterile field with impervious stockinette.  A timeout was called and the tourniquet inflated.  A transverse skin incision was made 12 cm distal to the tibial tubercle, the incision curved proximally, and a large posterior flap was created.  The tibia was transected just proximal to the skin incision and beveled anteriorly.  The fibula was transected just proximal to the tibial incision.  The sciatic nerve was pulled cut and allowed to retract.  The vascular bundles were suture ligated with 2-0 silk.  The tourniquet was deflated and hemostasis obtained.   ? ?The micro powder was applied against the residual limb the bones were then covered with the Kerecis sheet and stapled in  place. ? ? ?The deep and superficial fascial layers were closed using #1 Vicryl.  The skin was closed using staples.  The Prevena customizable dressing was applied this was overwrapped with the arthroform sponge.  Charlie Pitter was used to secure the sponges and the circumferential compression was secured to the skin with Dermatac.  This was connected to the wound VAC pump and had a good suction fit this was covered with a stump shrinker and a limb protector.  Patient was taken to the PACU in stable condition. ? ? ?DISCHARGE PLANNING: ? ?Antibiotic duration: 24-hour antibiotics ? ?Weightbearing: Nonweightbearing on the operative extremity ? ?Pain medication: Opioid pathway ? ?Dressing care/ Wound VAC: Continue wound VAC with the Prevena plus pump at discharge for 1 week ? ?Ambulatory devices: Walker or kneeling scooter ? ?Discharge to: Discharge planning based on recommendations per physical therapy ? ?Follow-up: In the office 1 week after discharge. ? ? ? ? ? ? ? ? ?

## 2022-04-05 ENCOUNTER — Encounter (HOSPITAL_COMMUNITY): Payer: Self-pay | Admitting: Orthopedic Surgery

## 2022-04-05 DIAGNOSIS — I70234 Atherosclerosis of native arteries of right leg with ulceration of heel and midfoot: Secondary | ICD-10-CM

## 2022-04-05 DIAGNOSIS — T82858A Stenosis of vascular prosthetic devices, implants and grafts, initial encounter: Secondary | ICD-10-CM

## 2022-04-05 LAB — GLUCOSE, CAPILLARY
Glucose-Capillary: 110 mg/dL — ABNORMAL HIGH (ref 70–99)
Glucose-Capillary: 141 mg/dL — ABNORMAL HIGH (ref 70–99)
Glucose-Capillary: 129 mg/dL — ABNORMAL HIGH (ref 70–99)
Glucose-Capillary: 211 mg/dL — ABNORMAL HIGH (ref 70–99)
Glucose-Capillary: 117 mg/dL — ABNORMAL HIGH (ref 70–99)

## 2022-04-05 LAB — CBC WITH DIFFERENTIAL/PLATELET
Abs Immature Granulocytes: 0.07 10*3/uL (ref 0.00–0.07)
Basophils Absolute: 0 10*3/uL (ref 0.0–0.1)
Basophils Relative: 0 %
Eosinophils Absolute: 0.2 10*3/uL (ref 0.0–0.5)
Eosinophils Relative: 3 %
HCT: 32 % — ABNORMAL LOW (ref 36.0–46.0)
Hemoglobin: 10 g/dL — ABNORMAL LOW (ref 12.0–15.0)
Immature Granulocytes: 1 %
Lymphocytes Relative: 21 %
Lymphs Abs: 2 10*3/uL (ref 0.7–4.0)
MCH: 28.6 pg (ref 26.0–34.0)
MCHC: 31.3 g/dL (ref 30.0–36.0)
MCV: 91.4 fL (ref 80.0–100.0)
Monocytes Absolute: 0.8 10*3/uL (ref 0.1–1.0)
Monocytes Relative: 9 %
Neutro Abs: 6.2 10*3/uL (ref 1.7–7.7)
Neutrophils Relative %: 66 %
Platelets: 201 10*3/uL (ref 150–400)
RBC: 3.5 MIL/uL — ABNORMAL LOW (ref 3.87–5.11)
RDW: 13.8 % (ref 11.5–15.5)
WBC: 9.4 10*3/uL (ref 4.0–10.5)
nRBC: 0 % (ref 0.0–0.2)

## 2022-04-05 LAB — BRAIN NATRIURETIC PEPTIDE: B Natriuretic Peptide: 402.8 pg/mL — ABNORMAL HIGH (ref 0.0–100.0)

## 2022-04-05 LAB — COMPREHENSIVE METABOLIC PANEL
ALT: 12 U/L (ref 0–44)
AST: 15 U/L (ref 15–41)
Albumin: 2.2 g/dL — ABNORMAL LOW (ref 3.5–5.0)
Alkaline Phosphatase: 69 U/L (ref 38–126)
Anion gap: 7 (ref 5–15)
BUN: 29 mg/dL — ABNORMAL HIGH (ref 8–23)
CO2: 23 mmol/L (ref 22–32)
Calcium: 8.4 mg/dL — ABNORMAL LOW (ref 8.9–10.3)
Chloride: 110 mmol/L (ref 98–111)
Creatinine, Ser: 0.97 mg/dL (ref 0.44–1.00)
GFR, Estimated: >60 mL/min (ref >60)
Glucose, Bld: 144 mg/dL — ABNORMAL HIGH (ref 70–99)
Potassium: 3.9 mmol/L (ref 3.5–5.1)
Sodium: 140 mmol/L (ref 135–145)
Total Bilirubin: 0.3 mg/dL (ref 0.3–1.2)
Total Protein: 5.7 g/dL — ABNORMAL LOW (ref 6.5–8.1)

## 2022-04-05 LAB — MAGNESIUM: Magnesium: 1.8 mg/dL (ref 1.7–2.4)

## 2022-04-05 MED ORDER — APIXABAN 5 MG PO TABS
5.0000 mg | ORAL_TABLET | Freq: Two times a day (BID) | ORAL | Status: DC
Start: 2022-04-06 — End: 2022-04-07
  Administered 2022-04-06 – 2022-04-07 (×3): 5 mg via ORAL
  Filled 2022-04-05 (×3): qty 1

## 2022-04-05 MED ORDER — APIXABAN 5 MG PO TABS: 5.0000 mg | ORAL_TABLET | Freq: Two times a day (BID) | ORAL | Status: DC

## 2022-04-05 MED ORDER — INSULIN ASPART 100 UNIT/ML IJ SOLN: 0.0000 [IU] | Freq: Three times a day (TID) | INTRAMUSCULAR | Status: DC

## 2022-04-05 MED ORDER — HEPARIN (PORCINE) 25000 UT/250ML-% IV SOLN: 1950.0000 [IU]/h | INTRAVENOUS | Status: DC

## 2022-04-05 MED ORDER — INSULIN ASPART 100 UNIT/ML IJ SOLN
0.0000 [IU] | Freq: Three times a day (TID) | INTRAMUSCULAR | Status: DC
  Administered 2022-04-06: 2 [IU] via SUBCUTANEOUS
  Administered 2022-04-06: 7 [IU] via SUBCUTANEOUS
  Administered 2022-04-06: 1 [IU] via SUBCUTANEOUS
  Administered 2022-04-06: 3 [IU] via SUBCUTANEOUS

## 2022-04-05 NOTE — Progress Notes (Signed)
?      ?                 PROGRESS NOTE ? ?      ?PATIENT DETAILS ?Name: Kerry Randall ?Age: 61 y.o. ?Sex: female ?Date of Birth: October 15, 1961 ?Admit Date: 03/29/2022 ?Admitting Physician Vianne Bulls, MD ?[id number], [place] ? ?Brief Summary: ?Patient is a 61 y.o.  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  See below for further details. ? ?Significant events: ?5/4>> admit to [facility] for necrotic right TMA stump. ? 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 ?5/8 >> bilateral common iliac artery stenting   ?5/10 >> R. BKA - Dr Sharol Given ? ?Significant microbiology data: ?5/4>> COVID/influenza PCR: Negative ?5/4>> blood culture: No growth ? ?Procedures: ?None ? ?Consults: ?Orthopedics, vascular surgery ? ?Subjective:  Patient in bed, appears comfortable, denies any headache, no fever, no chest pain or pressure, no shortness of breath , no abdominal pain. No new focal weakness. ? ? ? ?Objective: ?Vitals: ?Blood pressure (!) 118/57, pulse 70, temperature 98 ?F (36.7 ?C), temperature source Oral, resp. rate 18, height 5\' 9"  (1.753 m), weight 89.4 kg, SpO2 97 %.  ? ?Exam: ? ?Awake Alert, No new F.N deficits, Normal affect ?[place].AT,PERRAL ?Supple Neck, No JVD,   ?Symmetrical Chest wall movement, Good air movement bilaterally, CTAB ?RRR,No Gallops, Rubs or new Murmurs,  ?+ve B.Sounds, Abd Soft, No tenderness,   ?Right BKA stump under bandage with wound VAC in place. ? ? ?Assessment/Plan: ? ?Necrotic/nonhealing right TMA stump/critical right limb ischemia:  Vascular surgery/orthopedics following - for now Continue empiric IV antibiotics she is s/p  bilateral common iliac artery stenting by vascular surgeon Dr. Standley Dakins on 04/02/2022, seen by Dr. Sharol Given underwent right BKA on 04/04/2022,  patient agreeable for blood transfusion during the perioperative period.  Currently has postop dressing with wound VAC, will likely go to SNF with outpatient follow-up with Dr. Sharol Given.  Stop antibiotics on 04/06/2022.  Is discussed with Dr. Sharol Given. ? ?PAF: Rate controlled-continue metoprolol-Eliquis on hold for possible angiogram Monday-in the interim-we will start IV heparin.  CHA2DS2-VASc of at least 4.  As needed IV Lopressor added. ? ?PAD-s/p right common iliac artery stent June 2022-angioplasty of distal right SFA: Now status post Bilateral common iliac artery stenting on 04/02/2022, she is currently on aspirin and statin for secondary prevention.  We will resume Eliquis on 04/06/2022. ? ?CAD s/p PCI to RCA 2016: No anginal symptoms-continue aspirin statin and anticoagulation as above.  No acute issues. ? ?Peripheral neuropathy: Continue Neurontin/Cymbalta ? ?CKD stage IIIa: Creatinine close to baseline-follow periodically. ? ?Normocytic anemia: Mild-likely due to combination of acute illness/CKD-follow CBC periodically. ? ?DM-2 (A1c 7.0 on 5/5): CBGs relatively stable-continue 5 units of Semglee and SSI.  Follow and adjust.  Include D5W drip while she is n.p.o. for surgery on 04/04/2022 with every 2 hour Accu-Cheks. ? ?Recent Labs  ?  04/04/22 ?[id number] 04/05/22 ?[id number] 04/05/22 ?0801  ?GLUCAP 196* 110* 141*  ? ? ?Pressure Ulcer: ?  ? ?Obesity: ?Estimated body mass index is 29.09 kg/m? as calculated from the following: ?  Height as of this encounter: 5\' 9"  (1.753 m). ?  Weight as of this encounter: 89.4 kg.  ? ?Code status: ?  Code Status: Full Code  ? ?DVT Prophylaxis ?SCD's Start:  04/04/22 1414 ?Place and maintain sequential compression device Start: 03/29/22 2235 ?Starting IV heparin 5/6. ?  ?Family Communication: She prefers to update family herself-I have asked her to let us know if she changes her mind or if her family members have questions. ? ? ?Disposition Plan: ?Status is: Inpatient ?Remains inpatient appropriate  because: Necrotic right TMA stump/critical limb ischemia-not stable for discharge. ?  ?Planned Discharge Destination:Home health ? ? ?Diet: ?Diet Order   ? ?       ?  Diet Carb Modified Fluid consistency: Thin; Room service appropriate? Yes  Diet effective now       ?  ? ?  ?  ? ?  ?  ? ? ?MEDICATIONS: ?Scheduled Meds: ? vitamin C  1,000 mg Oral Daily  ? aspirin EC  81 mg Oral Daily  ? atorvastatin  40 mg Oral Daily  ? docusate sodium  100 mg Oral Daily  ? DULoxetine  60 mg Oral BID  ? feeding supplement  237 mL Oral BID BM  ? fesoterodine  8 mg Oral Daily  ? gabapentin  1,200 mg Oral BID  ? hydrOXYzine  75 mg Oral Daily  ? insulin aspart  0-9 Units Subcutaneous Q4H  ? insulin glargine-yfgn  8 Units Subcutaneous Daily  ? metoprolol succinate  25 mg Oral Daily  ? mirabegron ER  25 mg Oral Daily  ? nutrition supplement (JUVEN)  1 packet Oral BID BM  ? pantoprazole  40 mg Oral Daily  ? polyethylene glycol  17 g Oral Daily  ? sodium chloride flush  3 mL Intravenous Q12H  ? zinc sulfate  220 mg Oral Daily  ? ?Continuous Infusions: ? sodium chloride    ? aztreonam 2 g (04/05/22 0556)  ? heparin    ? magnesium sulfate bolus IVPB    ? metronidazole 500 mg (04/05/22 0445)  ? vancomycin 1,250 mg (04/04/22 2340)  ? ?PRN Meds:.sodium chloride, acetaminophen **OR** acetaminophen, acetaminophen, albuterol, alum & mag hydroxide-simeth, bisacodyl, guaiFENesin-dextromethorphan, hydrALAZINE, HYDROcodone-acetaminophen, HYDROmorphone (DILAUDID) injection, labetalol, magnesium sulfate bolus IVPB, metoprolol tartrate, metoprolol tartrate, ondansetron ? ? ?I have personally reviewed following labs and imaging studies ? ?LABORATORY DATA: ? ?Recent Labs  ?Lab 03/29/22 ?1427 03/30/22 ?0400 04/01/22 ?0335 04/02/22 ?0156 04/03/22 ?0109 04/04/22 ?0244 04/05/22 ?0129  ?WBC 13.6*   < > 11.6* 8.8 9.4 9.7 9.4  ?HGB 11.9*   < > 10.6* 10.9* 10.1* 9.6* 10.0*  ?HCT 36.9   < > 32.5* 34.1* 30.6* 30.0* 32.0*  ?PLT 266   < > 193 192 190 172 201  ?MCV 90.2    < > 90.0 90.2 88.7 90.1 91.4  ?MCH 29.1   < > 29.4 28.8 29.3 28.8 28.6  ?MCHC 32.2   < > 32.6 32.0 33.0 32.0 31.3  ?RDW 14.0   < > 13.6 13.6 13.4 13.6 13.8  ?LYMPHSABS 2.5  --   --  1.7 0.8 2.7 2.0  ?MONOABS 1.1*  --   --  0.8 0.8 0.9 0.8  ?EOSABS 0.2  --   --  0.4 0.0 0.3 0.2  ?BASOSABS 0.1  --   --  0.0 0.0 0.0 0.0  ? < > = values in this interval not displayed.  ? ? ?Recent Labs  ?Lab 03/29/22 ?1427 03/29/22 ?2142 03/30/22 ?0400 03/31/22 ?0031 04/01/22 ?0335 04/02/22 ?[id number] 04/03/22 ?0109 04/04/22 ?[id number] 04/05/22 ?0129  ?NA 137  --  139 137 137 138 136 139 140  ?K 4.2  --  4.0 4.7 4.6 3.7 4.1  3.8 3.9  ?CL 110  --  110 108 108 111 110 112* 110  ?CO2 20*  --  19* 24 21* 23 20* 20* 23  ?GLUCOSE 296*  --  203* 225* 175* 168* 304* 206* 144*  ?BUN 17  --  14 16 18 21  24* 31* 29*  ?CREATININE 1.08*  --  0.92 1.05* 0.98 1.00 0.96 0.98 0.97  ?CALCIUM 8.6*  --  8.7* 8.7* 8.7* 8.8* 8.5* 8.6* 8.4*  ?AST 9*  --   --   --   --  11* 10* 11* 15  ?ALT 10  --   --   --   --  9 11 11 12   ?ALKPHOS 89  --   --   --   --  84 76 64 69  ?BILITOT 0.5  --   --   --   --  0.2* 0.6 0.5 0.3  ?ALBUMIN 2.6*  --   --   --   --  2.2* 2.1* 2.1* 2.2*  ?MG  --   --   --   --   --  1.8 1.7 1.6* 1.8  ?CRP  --  17.7* 18.0* 16.2*  --   --   --   --   --   ?LATICACIDVEN 1.5  --   --   --   --   --   --   --   --   ?HGBA1C  --   --  7.0*  --   --   --   --   --   --   ?BNP  --   --   --   --   --  243.6* 474.1* 236.9* 402.8*  ? ?RADIOLOGY STUDIES/RESULTS: ?No results found. ? ? LOS: 7 days  ? ?Signature ? ?Lala Lund M.D on 04/05/2022 at 10:37 AM   -  To page go to [email or web address]  ? ? ?

## 2022-04-05 NOTE — Care Management Important Message (Signed)
Important Message ? ?Patient Details  ?Name: Kerry Randall ?MRN: [id number] ?Date of Birth: 1961/09/05 ? ? ?Medicare Important Message Given:  Yes ? ? ? ? ?Shelda Altes ?04/05/2022, 10:14 AM ?

## 2022-04-05 NOTE — Progress Notes (Signed)
ANTICOAGULATION CONSULT NOTE  ?Pharmacy Consult for heparin ?Indication: atrial fibrillation ? ?Patient Measurements: ?Height: 5\' 9"  (175.3 cm) ?Weight: 89.4 kg (197 lb) ?IBW/kg (Calculated) : 66.2 ?Heparin Dosing Weight: 86.4 kg ? ?Vital Signs: ?Temp: 98 ?F (36.7 ?C) (05/11 [id number]) ?Temp Source: Oral (05/11 0758) ?BP: 118/57 (05/11 0758) ?Pulse Rate: 70 (05/11 0758) ? ?Labs: ?Recent Labs  ?  04/03/22 ?0109 04/03/22 ?1054 04/04/22 ?0244 04/05/22 ?0129  ?HGB 10.1*  --  9.6* 10.0*  ?HCT 30.6*  --  30.0* 32.0*  ?PLT 190  --  172 201  ?HEPARINUNFRC 0.20* 0.51 0.40  --   ?CREATININE 0.96  --  0.98 0.97  ? ?Estimated Creatinine Clearance: 70.8 mL/min (by C-G formula based on SCr of 0.97 mg/dL). ? ?Assessment: ?61 y.o. female admitted on 5/4 for diabetic foot infection and PMH significant for Afib on PTA Eliquis. Unsure of last known dose, per patient report 3-4 days before admission. Pharmacy consulted to dose IV heparin. ? ?Heparin level at goal prior to surgery yesterday on 1950 units/hr of heparin. No bleeding issues noted overnight. Will resume heparin this am. ? ?Goal of Therapy:  ?Heparin level 0.3-0.7 units/mL ?Monitor platelets by anticoagulation protocol: Yes ?  ?Plan:  ?Restart heparin at 1950 units/hr ?Check heparin level with am labs ? ?Erin Hearing PharmD., BCPS ?Clinical Pharmacist ?04/05/2022 10:09 AM ? ?

## 2022-04-05 NOTE — Evaluation (Signed)
Occupational Therapy Evaluation ?Patient Details ?Name: Kerry Randall ?MRN: [id number] ?DOB: [birth year] ?Today's Date: 04/05/2022 ? ? ?History of Present Illness Pt is a 61 y/o F presenting to ED from podiatrist on 5/4 with R diabetic food ulcer now s/p bilateral common iliac artery stenting 5/8. 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  ? ?Clinical Impression ?  ?Pt admitted with the above diagnosis with recent R BKA and has the deficits listed below. Pt would benefit from cont OT to increase independence with basic adls, adl transfers and home management so pt can d/c home to her apartment eventually alone.  Pt lives alone and does not have anyone that can stay with her or assist. Feel SNF is a great option for this pt to become independent and return home.  Will see acutely to start this process.  ?  ?   ? ?Recommendations for follow up therapy are one component of a multi-disciplinary discharge planning process, led by the attending physician.  Recommendations may be updated based on patient status, additional functional criteria and insurance authorization.  ? ?Follow Up Recommendations ? Skilled nursing-short term rehab (<3 hours/day)  ?  ?Assistance Recommended at Discharge Frequent or constant Supervision/Assistance  ?Patient can return home with the following A little help with walking and/or transfers;A little help with bathing/dressing/bathroom;Assistance with cooking/housework;Assist for transportation;Help with stairs or ramp for entrance ? ?  ?Functional Status Assessment ? Patient has had a recent decline in their functional status and demonstrates the ability to make significant improvements in function in a reasonable and predictable amount of time.  ?Equipment Recommendations ? Tub/shower bench;BSC/3in1  ?  ?Recommendations for Other Services   ? ? ?  ?Precautions / Restrictions Precautions ?Precautions: Fall ?Precaution Comments: wound  vac, ?Required Braces or Orthoses: Other Brace ?Other Brace: limb protector ?Restrictions ?Weight Bearing Restrictions: Yes ?RLE Weight Bearing: Non weight bearing  ? ?  ? ?Mobility Bed Mobility ?  ?  ?  ?  ?  ?  ?  ?General bed mobility comments: in chair on arrival after PT ?  ? ?Transfers ?Overall transfer level: Needs assistance ?Equipment used: Rolling walker (2 wheels) ?Transfers: Sit to/from Stand, Bed to chair/wheelchair/BSC ?Sit to Stand: Min guard ?Stand pivot transfers: Min assist ?  ?Step pivot transfers: Min assist ?  ?  ?General transfer comment: min guard for sit>stand, vc for hand placement for power up, good self steadying, once in upright able to hop forward x2 and then pivoting on L foot to sit in chair ?  ? ?  ?Balance Overall balance assessment: Needs assistance ?Sitting-balance support: Feet supported, No upper extremity supported ?Sitting balance-Leahy Scale: Good ?  ?  ?Standing balance support: During functional activity ?Standing balance-Leahy Scale: Poor ?Standing balance comment: requires B UE support to maintain balance ?  ?  ?  ?  ?  ?  ?  ?  ?  ?  ?  ?   ? ?ADL either performed or assessed with clinical judgement  ? ?ADL Overall ADL's : Needs assistance/impaired ?Eating/Feeding: Independent;Sitting ?  ?Grooming: Wash/dry hands;Wash/dry face;Oral care;Set up;Sitting ?  ?Upper Body Bathing: Set up;Sitting ?  ?Lower Body Bathing: Minimal assistance;Sit to/from stand;Cueing for compensatory techniques ?  ?Upper Body Dressing : Set up;Sitting ?  ?Lower Body Dressing: Moderate assistance;Sit to/from stand;Cueing for compensatory techniques ?Lower Body Dressing Details (indicate cue type and reason): assist with limb protector and sit to stand to pull pants up. ?  Toilet Transfer: Minimal assistance;Stand-pivot;Rolling walker (2 wheels);BSC/3in1 ?  ?Toileting- Clothing Manipulation and Hygiene: Minimal assistance;Sit to/from stand;Cueing for compensatory techniques ?  ?  ?Tub/Shower Transfer  Details (indicate cue type and reason): will need to learn tub bench transfers ?Functional mobility during ADLs: Minimal assistance;Rolling walker (2 wheels) ?General ADL Comments: Pt with new LE amputation and so far doing well with mobility but much more limited.  Pt lives alone and will not be able to go straight home alone and does not have much asisst outside of someone that calls to check on her 1x a day.  ? ? ? ?Vision Baseline Vision/History: 0 No visual deficits ?Ability to See in Adequate Light: 0 Adequate ?Patient Visual Report: No change from baseline ?Vision Assessment?: No apparent visual deficits  ?   ?Perception   ?  ?Praxis   ?  ? ?Pertinent Vitals/Pain Pain Assessment ?Pain Assessment: No/denies pain  ? ? ? ?Hand Dominance Right ?  ?Extremity/Trunk Assessment Upper Extremity Assessment ?Upper Extremity Assessment: Overall WFL for tasks assessed ?  ?Lower Extremity Assessment ?Lower Extremity Assessment: Defer to PT evaluation ?  ?Cervical / Trunk Assessment ?Cervical / Trunk Assessment: Normal ?  ?Communication Communication ?Communication: No difficulties ?  ?Cognition Arousal/Alertness: Awake/alert ?Behavior During Therapy: [facility] for tasks assessed/performed ?Overall Cognitive Status: Within Functional Limits for tasks assessed ?  ?  ?  ?  ?  ?  ?  ?  ?  ?  ?  ?  ?  ?  ?  ?  ?  ?  ?  ?General Comments  Pt limited with adl transfers at this time due to  new amputation. ? ?  ?Exercises   ?  ?Shoulder Instructions    ? ? ?Home Living Family/patient expects to be discharged to:: Private residence ?Living Arrangements: Alone ?Available Help at Discharge: Friend(s);Other (Comment) ?Type of Home: Apartment ?Home Access: Elevator ?  ?  ?Home Layout: One level ?  ?  ?Bathroom Shower/Tub: Tub/shower unit ?  ?Bathroom Toilet: Standard ?  ?  ?Home Equipment: None ?  ?Additional Comments: Now will need shower bench after rehab and possilby a 3:1 ?  ? ?  ?Prior Functioning/Environment Prior Level of Function :  Independent/Modified Independent ?  ?  ?  ?  ?  ?  ?Mobility Comments: Independent, does not work. Hx of falls. ?ADLs Comments: Pt was mod I prior to admission ?  ? ?  ?  ?OT Problem List: Impaired balance (sitting and/or standing);Decreased knowledge of use of DME or AE;Decreased knowledge of precautions ?  ?   ?OT Treatment/Interventions: Self-care/ADL training;DME and/or AE instruction;Therapeutic activities;Balance training  ?  ?OT Goals(Current goals can be found in the care plan section) Acute Rehab OT Goals ?Patient Stated Goal: to be independent again ?OT Goal Formulation: With patient ?Time For Goal Achievement: 04/19/22 ?Potential to Achieve Goals: Good ?ADL Goals ?Pt Will Perform Grooming: with supervision;standing ?Pt Will Perform Lower Body Bathing: with supervision;sit to/from stand ?Pt Will Perform Lower Body Dressing: with supervision;sit to/from stand ?Pt Will Transfer to Toilet: with supervision;ambulating;stand pivot transfer ?Pt Will Perform Tub/Shower Transfer: Tub transfer;with supervision;tub bench;ambulating;rolling walker  ?OT Frequency: Min 2X/week ?  ? ?Co-evaluation   ?  ?  ?  ?  ? ?  ?AM-PAC OT "6 Clicks" Daily Activity     ?Outcome Measure Help from another person eating meals?: None ?Help from another person taking care of personal grooming?: None ?Help from another person toileting, which includes using toliet, bedpan, or  urinal?: A Little ?Help from another person bathing (including washing, rinsing, drying)?: A Little ?Help from another person to put on and taking off regular upper body clothing?: None ?Help from another person to put on and taking off regular lower body clothing?: A Lot ?6 Click Score: 20 ?  ?End of Session Equipment Utilized During Treatment: Rolling walker (2 wheels) ?Nurse Communication: Mobility status ? ?Activity Tolerance: Patient tolerated treatment well ?Patient left: in chair;with call bell/phone within reach;with chair alarm set ? ?OT Visit Diagnosis:  Unsteadiness on feet (R26.81)  ?              ?Time: [id number] ?OT Time Calculation (min): 32 min ?Charges:  OT General Charges ?$OT Visit: 1 Visit ?OT Evaluation ?$OT Eval Low Complexity: 1 Low ?OT Treatments ?$

## 2022-04-05 NOTE — Progress Notes (Addendum)
[facility] [place] ? ?Subjective  - Comfortable ? ? ?Objective ?113/69 ?81 ?98.4 ?F (36.9 ?C) (Oral) ?17 ?97% ? ?Intake/Output Summary (Last 24 hours) at 04/05/2022 0735 ?Last data filed at 04/04/2022 2340 ?Gross per 24 hour  ?Intake 569.99 ml  ?Output 50 ml  ?Net 519.99 ml  ? ? ?Right BKA dressing with vac in place no drainage ?Left LE M/S intact DP/PT signal monophasic by doppler ?General no acute distress ? ?Assessment/Planning: ?PAD s/p B common iliac artery stenting ?S/P right BKA by Dr. Lajoyce Corners ? ?I will arrange f/u for sent surveillance as an OP ?VVVS optimized arterial inflow call if we are needed.  ? ?Kerry Randall ?04/05/2022 ?7:35 AM ?-- ? ?Laboratory ?Lab Results: ?Recent Labs  ?  04/04/22 ?0244 04/05/22 ?0129  ?WBC 9.7 9.4  ?HGB 9.6* 10.0*  ?HCT 30.0* 32.0*  ?PLT 172 201  ? ?BMET ?Recent Labs  ?  04/04/22 ?0244 04/05/22 ?0129  ?NA 139 140  ?K 3.8 3.9  ?CL 112* 110  ?CO2 20* 23  ?GLUCOSE 206* 144*  ?BUN 31* 29*  ?CREATININE 0.98 0.97  ?CALCIUM 8.6* 8.4*  ? ? ?COAG ?No results found for: INR, PROTIME ?No results found for: PTT ? ? ?I have seen and evaluated the patient. I agree with the PA note as documented above. S/p common iliac artery stenting on Monday for occluded right iliac stent and no femoral pulse needing major amputation.  Now optimized for wound healing and had BKA by Dr. Lajoyce Corners.  Discussed will arrange outpatient f/u in 4-6 weeks with iliac duplex for surveillance.  Aspirin and lipitor from our standpoint.  Also on eliquis for atrial fibrillation.  Call vascular with questions or concerns. ? ?Cephus Shelling, MD ?[facility] ?Office: [phone] ? ?

## 2022-04-05 NOTE — TOC Initial Note (Signed)
Transition of Care (TOC) - Initial/Assessment Note  ? ? ?Patient Details  ?Name: Kerry Randall ?MRN: [id number] ?Date of Birth: 1961/08/07 ? ?Transition of Care (TOC) CM/SW Contact:    ?Vinie Sill, LCSW ?Phone Number: ?04/05/2022, 2:51 PM ? ?Clinical Narrative:                 ? ? ?CSW met with patient at bedside, CSW introduced self and explained role. Patient's daughter, Lenna Sciara included in conversation via face time. CSW discussed recommendation of short term rehab at [facility] before discharging home. Patient states she lives home alone and is agreeable to short term rehab at [facility]. She states she wants to get stronger so she can safely return home. No preferred Snf at this time. CSW explained the SNF process.  ? ?CSW will provide bed offers once available ?CSW will continue to follow and assist with discharge planning.  ? ?Thurmond Butts, MSW, LCSW ?Clinical Social Worker ? ?  ? ? ?Expected Discharge Plan: [place] ?Barriers to Discharge: Ship broker, Continued Medical Work up, SNF Pending bed offer ? ? ?Patient Goals and CMS Choice ?  ?  ?  ? ?Expected Discharge Plan and Services ?Expected Discharge Plan: [place] ?  ?  ?  ?Living arrangements for the past 2 months: [place] ?                ?  ?  ?  ?  ?  ?  ?  ?  ?  ?  ? ?Prior Living Arrangements/Services ?Living arrangements for the past 2 months: [place] ?Lives with:: Self ?Patient language and need for interpreter reviewed:: No ?Do you feel safe going back to the place where you live?: No   wants SNF  ?Need for Family Participation in Patient Care: Yes (Comment) ?Care giver support system in place?: Yes (comment) ?  ?Criminal Activity/Legal Involvement Pertinent to Current Situation/Hospitalization: No - Comment as needed ? ?Activities of Daily Living ?Home Assistive Devices/Equipment: None ?ADL Screening (condition at time of admission) ?Patient's cognitive ability adequate to safely complete  daily activities?: Yes ?Is the patient deaf or have difficulty hearing?: No ?Does the patient have difficulty seeing, even when wearing glasses/contacts?: No ?Does the patient have difficulty concentrating, remembering, or making decisions?: No ?Patient able to express need for assistance with ADLs?: Yes ?Does the patient have difficulty dressing or bathing?: No ?Independently performs ADLs?: Yes (appropriate for developmental age) ?Does the patient have difficulty walking or climbing stairs?: No ?Weakness of Legs: Right ?Weakness of Arms/Hands: None ? ?Permission Sought/Granted ?Permission sought to share information with : Family Supports ?Permission granted to share information with : Yes, Verbal Permission Granted ? Share Information with NAME: [place] ? Permission granted to share info w AGENCY: SNF ? Permission granted to share info w Relationship: daughter ? Permission granted to share info w Contact Information: [phone] ? ?Emotional Assessment ?  ?Attitude/Demeanor/Rapport: Engaged ?Affect (typically observed): Accepting, Pleasant, Appropriate ?Orientation: : Oriented to Self, Oriented to Place, Oriented to  Time, Oriented to Situation ?Alcohol / Substance Use: Not Applicable ?Psych Involvement: No (comment) ? ?Admission diagnosis:  Diabetic infection of right foot ([place]) [U82.800, L08.9] ?Gangrene of right foot ([place]) [I96] ?Patient Active Problem List  ? Diagnosis Date Noted  ? Severe protein-calorie malnutrition ([place])   ? Diabetic infection of right foot ([place]) 03/29/2022  ? Stage 3a chronic kidney disease (CKD) ([place]) 03/29/2022  ? Atrial fibrillation ([place])   ? Insulin-requiring or dependent  type II diabetes mellitus ([place])   ? PAD (peripheral artery disease) ([place])   ? Major depressive disorder, recurrent ([place])   ? CAD (coronary artery disease)   ? Gangrene of right foot ([place])   ? Critical lower limb ischemia ([place]) 04/26/2021  ? ?PCP:  [facility] [place]:   ?[place] ?[place] [address] ?Phone: [phone] Fax: [phone] ? ? ? ? ?Social Determinants of Health (SDOH) Interventions ?  ? ?Readmission Risk Interventions ?   ? View : No data to display.  ?  ?  ?  ? ? ? ?

## 2022-04-05 NOTE — Progress Notes (Addendum)
Physical Therapy Re-Evaluation and Treatment ?Patient Details ?Name: Kerry Randall ?MRN: [id number] ?DOB: [birth year] ?Today's Date: 04/05/2022 ? ? ?History of Present Illness Pt is a 61 y/o F presenting to ED from podiatrist on 5/4 with R diabetic food ulcer now s/p bilateral common iliac artery stenting 5/8. s/p 5/10 R BKAPMH includes A fib, TMA, DM, hypercholesteremia, HTN, MDD, and vitamin D deficiency. ? ?  ?PT Comments  ? ? Pt s/p R BKA and mobility is limited by decreased strength, balance and endurance. Pt reports pain with movement is better since sx. Pt is currently min guard for bed mobility, and min A for stand pivot transfers with RW. Pt provided information on need for R hip and knee ROM for proper prosthetic placement once limb healed. Performed seated therex. OT in room at end of session. PT recommending short bout of SNF level rehab prior to return home. Goals updated to reflect s/p surgery. PT will continue to follow acutely. ?  ?Recommendations for follow up therapy are one component of a multi-disciplinary discharge planning process, led by the attending physician.  Recommendations may be updated based on patient status, additional functional criteria and insurance authorization. ? ?Follow Up Recommendations ? Skilled nursing-short term rehab (<3 hours/day) ?  ?  ?Assistance Recommended at Discharge Frequent or constant Supervision/Assistance  ?Patient can return home with the following Assist for transportation;A lot of help with walking and/or transfers;A lot of help with bathing/dressing/bathroom;Help with stairs or ramp for entrance ?  ?Equipment Recommendations ? None recommended by PT  ?  ?Recommendations for Other Services   ? ? ?  ?Precautions / Restrictions Precautions ?Precautions: Fall ?Precaution Comments: wound vac, ?Required Braces or Orthoses: Other Brace (limb protector) ?Restrictions ?Weight Bearing Restrictions: Yes ?RLE Weight Bearing: Non weight bearing  ?  ? ?Mobility ? Bed  Mobility ?Overal bed mobility: Needs Assistance ?Bed Mobility: Supine to Sit ?  ?  ?Supine to sit: Modified independent (Device/Increase time), HOB elevated ?  ?  ?General bed mobility comments: No assist needed. ?  ? ?Transfers ?Overall transfer level: Needs assistance ?Equipment used: None, Rolling walker (2 wheels) ?Transfers: Sit to/from Stand, Bed to chair/wheelchair/BSC ?Sit to Stand: Min guard ?  ?Step pivot transfers: Min assist ?  ?  ?  ?General transfer comment: min guard for sit>stand, vc for hand placement for power up, good self steadying, once in upright able to hop forward x2 and then pivoting on L foot to sit in chair ?  ? ?Ambulation/Gait ?  ?  ?  ?  ?Gait velocity: limited by UE strength ?  ?  ?  ? ? ?  ? ? ?  ?Balance Overall balance assessment: Needs assistance ?Sitting-balance support: Feet supported, No upper extremity supported ?Sitting balance-Leahy Scale: Good ?  ?  ?Standing balance support: During functional activity ?Standing balance-Leahy Scale: Poor ?Standing balance comment: requires B UE support to maintain balance ?  ?  ?  ?  ?  ?  ?  ?  ?  ?  ?  ?  ? ?  ?Cognition Arousal/Alertness: Awake/alert ?Behavior During Therapy: [facility] for tasks assessed/performed ?Overall Cognitive Status: Within Functional Limits for tasks assessed ?  ?  ?  ?  ?  ?  ?  ?  ?  ?  ?  ?  ?  ?  ?  ?  ?  ?  ?  ? ?  ?Exercises Amputee Exercises ?Quad Sets: AROM, Left, 5 reps, Seated ?Gluteal Sets: AROM, Both, 10 reps,  Seated ?Hip ABduction/ADduction: AROM, Left, 5 reps, Seated ?Knee Flexion: AROM, Left, 5 reps, Seated ?Chair Push Up: AROM, Both, 10 reps, Seated ? ?  ?General Comments General comments (skin integrity, edema, etc.): VSS on RA, wound vac in place and working ?  ?  ? ?Pertinent Vitals/Pain Pain Assessment ?Pain Assessment: Faces ?Faces Pain Scale: Hurts a little bit ?Pain Location: leg ?Pain Descriptors / Indicators: Sore, Guarding ?Pain Intervention(s): Limited activity within patient's tolerance,  Monitored during session, Repositioned  ? ? ? ?PT Goals (current goals can now be found in the care plan section) Acute Rehab PT Goals ?Patient Stated Goal: to go home ?PT Goal Formulation: With patient ?Time For Goal Achievement: 04/17/22 ?Potential to Achieve Goals: Good ?Progress towards PT goals: Progressing toward goals ? ?  ?Frequency ? ? ? Min 3X/week ? ? ? ?  ?PT Plan Discharge plan needs to be updated  ? ? ?   ?AM-PAC PT "6 Clicks" Mobility   ?Outcome Measure ? Help needed turning from your back to your side while in a flat bed without using bedrails?: None ?Help needed moving from lying on your back to sitting on the side of a flat bed without using bedrails?: A Little ?Help needed moving to and from a bed to a chair (including a wheelchair)?: A Little ?Help needed standing up from a chair using your arms (e.g., wheelchair or bedside chair)?: A Little ?Help needed to walk in hospital room?: Total ?Help needed climbing 3-5 steps with a railing? : Total ?6 Click Score: 15 ? ?  ?End of Session Equipment Utilized During Treatment: Gait belt ?Activity Tolerance: Patient tolerated treatment well ?Patient left: in bed;with call bell/phone within reach ?Nurse Communication: Mobility status ?PT Visit Diagnosis: Difficulty in walking, not elsewhere classified (R26.2) ?  ? ? ?Time: [id number] ?PT Time Calculation (min) (ACUTE ONLY): 37 min ? ?Charges:  Re-Evaluation ?                  $Therapeutic Exercise: 8-22 mins          ?          ? ?Christian Treadway B. Migdalia Dk PT, DPT ?Acute Rehabilitation Services ?Please use secure chat or  ?Call Office [phone] ? ? ? ?[place] ?04/05/2022, 9:59 AM ? ?

## 2022-04-05 NOTE — Progress Notes (Signed)
Patient ID: Kerry Randall, female   DOB: 06/19/1961, 61 y.o.   MRN: [id number] ?Patient is a 61 year old woman who is postoperative day 1 transtibial amputation.  There is no drainage in the wound VAC canister.  Patient states she has no pain and states she feels better with the resolution of the infection.  Patient can stop antibiotics after today. ?

## 2022-04-05 NOTE — [place] FL2 (Signed)
?[place] MEDICAID FL2 LEVEL OF CARE SCREENING TOOL  ?  ? ?IDENTIFICATION  ?Patient Name: ?Kerry Randall Birthdate: 02/10/1961 Sex: female Admission Date (Current Location): ?03/29/2022  ?[place] and [place] Number: ? [place] ?  Facility and Address:  ?[facility] [place], [place] [address] [place], [place] [address] ?     Provider Number: ?[id number]  ?Attending Physician Name and Address:  ?Thurnell Lose, MD ? Relative Name and Phone Number:  ?  ?   ?Current Level of Care: ?Hospital Recommended Level of Care: ?[place] Prior Approval Number: ?  ? ?Date Approved/Denied: ?  PASRR Number: ?[id number] ? ?Discharge Plan: ?SNF ?  ? ?Current Diagnoses: ?Patient Active Problem List  ? Diagnosis Date Noted  ? Severe protein-calorie malnutrition ([place])   ? Diabetic infection of right foot ([place]) 03/29/2022  ? Stage 3a chronic kidney disease (CKD) ([place]) 03/29/2022  ? Atrial fibrillation ([place])   ? Insulin-requiring or dependent type II diabetes mellitus ([place])   ? PAD (peripheral artery disease) ([place])   ? Major depressive disorder, recurrent ([place])   ? CAD (coronary artery disease)   ? Gangrene of right foot ([place])   ? Critical lower limb ischemia ([place]) 04/26/2021  ? ? ?Orientation RESPIRATION BLADDER Height & Weight   ?  ?Self, Time, Situation, Place ? Normal Continent Weight: 197 lb (89.4 kg) ?Height:  5\' 9"  (175.3 cm)  ?BEHAVIORAL SYMPTOMS/MOOD NEUROLOGICAL BOWEL NUTRITION STATUS  ?    Continent Diet (please see discharge summary)  ?AMBULATORY STATUS COMMUNICATION OF NEEDS Skin   ?Limited Assist Verbally Surgical wounds (non pressure wound mid sacrum,nedagative pressue wound therapy pretibial proximal, right) ?  ?  ?  ?    ?     ?     ? ? ?Personal Care Assistance Level of Assistance  ?Bathing, Feeding, Dressing Bathing Assistance: Limited assistance ?Feeding assistance: Independent ?Dressing Assistance: Limited assistance ?   ? ?Functional Limitations Info  ?Sight, Hearing, Speech Sight Info:  Impaired ?Hearing Info: Adequate ?Speech Info: Adequate  ? ? ?SPECIAL CARE FACTORS FREQUENCY  ?PT (By licensed PT), OT (By licensed OT)   ?  ?PT Frequency: 5x per week ?OT Frequency: 5x per week ?  ?  ?  ?   ? ? ?Contractures Contractures Info: Not present  ? ? ?Additional Factors Info  ?Code Status, Allergies Code Status Info: FULL ?Allergies Info: Erythromycin,Iodinated Contrast ,Levofloxacin,Oxycodone acetaminophen,Penicillins,Rosuvastatin,Sulfa Antibiotics ?  ?  ?  ?   ? ?Current Medications (04/05/2022):  This is the current hospital active medication list ?Current Facility-Administered Medications  ?Medication Dose Route Frequency Provider Last Rate Last Admin  ? 0.9 %  sodium chloride infusion  250 mL Intravenous PRN Newt Minion, MD      ? acetaminophen (TYLENOL) tablet 650 mg  650 mg Oral Q6H PRN Newt Minion, MD   650 mg at 04/01/22 0955  ? Or  ? acetaminophen (TYLENOL) suppository 650 mg  650 mg Rectal Q6H PRN Newt Minion, MD      ? acetaminophen (TYLENOL) tablet 650 mg  650 mg Oral Q4H PRN Newt Minion, MD      ? albuterol (PROVENTIL) (2.5 MG/3ML) 0.083% nebulizer solution 3 mL  3 mL Inhalation Q6H PRN Newt Minion, MD      ? alum & mag hydroxide-simeth (MAALOX/MYLANTA) 200-200-20 MG/5ML suspension 15-30 mL  15-30 mL Oral Q2H PRN Newt Minion, MD      ? Derrill Memo ON 04/06/2022] apixaban (ELIQUIS) tablet 5  mg  5 mg Oral BID Lyndee Leo, RPH      ? ascorbic acid (VITAMIN C) tablet 1,000 mg  1,000 mg Oral Daily Newt Minion, MD   1,000 mg at 04/05/22 [id number]  ? aspirin EC tablet 81 mg  81 mg Oral Daily Newt Minion, MD   81 mg at 04/05/22 [id number]  ? atorvastatin (LIPITOR) tablet 40 mg  40 mg Oral Daily Newt Minion, MD   40 mg at 04/05/22 [id number]  ? aztreonam (AZACTAM) 2 g in sodium chloride 0.9 % 100 mL IVPB  2 g Intravenous Q8H Lyndee Leo, RPH 200 mL/hr at 04/05/22 1304 2 g at 04/05/22 1304  ? bisacodyl (DULCOLAX) EC tablet 5 mg  5 mg Oral Daily PRN Newt Minion, MD      ? docusate sodium  (COLACE) capsule 100 mg  100 mg Oral Daily Newt Minion, MD   100 mg at 04/05/22 0943  ? DULoxetine (CYMBALTA) DR capsule 60 mg  60 mg Oral BID Newt Minion, MD   60 mg at 04/05/22 0943  ? feeding supplement (ENSURE ENLIVE / ENSURE PLUS) liquid 237 mL  237 mL Oral BID BM Newt Minion, MD   237 mL at 04/03/22 1447  ? fesoterodine (TOVIAZ) tablet 8 mg  8 mg Oral Daily Newt Minion, MD   8 mg at 04/05/22 [id number]  ? gabapentin (NEURONTIN) capsule 1,200 mg  1,200 mg Oral BID Newt Minion, MD   1,200 mg at 04/05/22 [id number]  ? guaiFENesin-dextromethorphan (ROBITUSSIN DM) 100-10 MG/5ML syrup 15 mL  15 mL Oral Q4H PRN Newt Minion, MD      ? hydrALAZINE (APRESOLINE) injection 5 mg  5 mg Intravenous Q20 Min PRN Newt Minion, MD      ? HYDROcodone-acetaminophen (NORCO/VICODIN) 5-325 MG per tablet 1-2 tablet  1-2 tablet Oral Q6H PRN Newt Minion, MD   2 tablet at 04/05/22 1157  ? HYDROmorphone (DILAUDID) injection 0.5-1 mg  0.5-1 mg Intravenous Q4H PRN Newt Minion, MD      ? hydrOXYzine (ATARAX) tablet 75 mg  75 mg Oral Daily Newt Minion, MD   75 mg at 04/05/22 [id number]  ? insulin aspart (novoLOG) injection 0-9 Units  0-9 Units Subcutaneous Q4H Newt Minion, MD   1 Units at 04/05/22 1254  ? insulin glargine-yfgn (SEMGLEE) injection 8 Units  8 Units Subcutaneous Daily Newt Minion, MD   8 Units at 04/05/22 0941  ? labetalol (NORMODYNE) injection 10 mg  10 mg Intravenous Q10 min PRN Newt Minion, MD      ? magnesium sulfate IVPB 2 g 50 mL  2 g Intravenous Daily PRN Newt Minion, MD      ? metoprolol succinate (TOPROL-XL) 24 hr tablet 25 mg  25 mg Oral Daily Newt Minion, MD   25 mg at 04/05/22 0943  ? metoprolol tartrate (LOPRESSOR) injection 2-5 mg  2-5 mg Intravenous Q2H PRN Newt Minion, MD      ? metoprolol tartrate (LOPRESSOR) injection 5 mg  5 mg Intravenous Q4H PRN Newt Minion, MD      ? metroNIDAZOLE (FLAGYL) IVPB 500 mg  500 mg Intravenous Q12H Lyndee Leo, RPH 100 mL/hr at 04/05/22 0445  500 mg at 04/05/22 0445  ? mirabegron ER (MYRBETRIQ) tablet 25 mg  25 mg Oral Daily Newt Minion, MD   25 mg at 04/05/22 [id number]  ? nutrition supplement (JUVEN) (  JUVEN) powder packet 1 packet  1 packet Oral BID BM Newt Minion, MD   1 packet at 04/05/22 [phone]  ? ondansetron (ZOFRAN) injection 4 mg  4 mg Intravenous Q6H PRN Newt Minion, MD      ? pantoprazole (PROTONIX) EC tablet 40 mg  40 mg Oral Daily Newt Minion, MD   40 mg at 04/05/22 [id number]  ? polyethylene glycol (MIRALAX / GLYCOLAX) packet 17 g  17 g Oral Daily Newt Minion, MD   17 g at 04/05/22 0941  ? sodium chloride flush (NS) 0.9 % injection 3 mL  3 mL Intravenous Q12H Newt Minion, MD   3 mL at 04/05/22 0946  ? vancomycin (VANCOREADY) IVPB 1250 mg/250 mL  1,250 mg Intravenous Q24H Lyndee Leo, RPH 166.7 mL/hr at 04/04/22 2340 1,250 mg at 04/04/22 2340  ? zinc sulfate capsule 220 mg  220 mg Oral Daily Newt Minion, MD   220 mg at 04/05/22 [id number]  ? ? ? ?Discharge Medications: ?Please see discharge summary for a list of discharge medications. ? ?Relevant Imaging Results: ? ?Relevant Lab Results: ? ? ?Additional Information ?SSN [id number]  patient states she has received covid vaccines ? ?Vinie Sill, LCSW ? ? ? ? ?

## 2022-04-06 LAB — CBC
HCT: 30.7 % — ABNORMAL LOW (ref 36.0–46.0)
Hemoglobin: 9.9 g/dL — ABNORMAL LOW (ref 12.0–15.0)
MCH: 29.2 pg (ref 26.0–34.0)
MCHC: 32.2 g/dL (ref 30.0–36.0)
MCV: 90.6 fL (ref 80.0–100.0)
Platelets: 168 10*3/uL (ref 150–400)
RBC: 3.39 MIL/uL — ABNORMAL LOW (ref 3.87–5.11)
RDW: 13.9 % (ref 11.5–15.5)
WBC: 8.6 10*3/uL (ref 4.0–10.5)
nRBC: 0 % (ref 0.0–0.2)

## 2022-04-06 LAB — COMPREHENSIVE METABOLIC PANEL
ALT: 11 U/L (ref 0–44)
AST: 14 U/L — ABNORMAL LOW (ref 15–41)
Albumin: 2.2 g/dL — ABNORMAL LOW (ref 3.5–5.0)
Alkaline Phosphatase: 59 U/L (ref 38–126)
Anion gap: 8 (ref 5–15)
BUN: 24 mg/dL — ABNORMAL HIGH (ref 8–23)
CO2: 19 mmol/L — ABNORMAL LOW (ref 22–32)
Calcium: 8 mg/dL — ABNORMAL LOW (ref 8.9–10.3)
Chloride: 107 mmol/L (ref 98–111)
Creatinine, Ser: 0.95 mg/dL (ref 0.44–1.00)
GFR, Estimated: >60 mL/min (ref >60)
Glucose, Bld: 139 mg/dL — ABNORMAL HIGH (ref 70–99)
Potassium: 3.9 mmol/L (ref 3.5–5.1)
Sodium: 134 mmol/L — ABNORMAL LOW (ref 135–145)
Total Bilirubin: 0.4 mg/dL (ref 0.3–1.2)
Total Protein: 5.5 g/dL — ABNORMAL LOW (ref 6.5–8.1)

## 2022-04-06 LAB — GLUCOSE, CAPILLARY
Glucose-Capillary: 125 mg/dL — ABNORMAL HIGH (ref 70–99)
Glucose-Capillary: 301 mg/dL — ABNORMAL HIGH (ref 70–99)
Glucose-Capillary: 198 mg/dL — ABNORMAL HIGH (ref 70–99)
Glucose-Capillary: 248 mg/dL — ABNORMAL HIGH (ref 70–99)

## 2022-04-06 LAB — MAGNESIUM: Magnesium: 1.7 mg/dL (ref 1.7–2.4)

## 2022-04-06 MED ORDER — HYDROCODONE-ACETAMINOPHEN 5-325 MG PO TABS: 1.0000 | ORAL_TABLET | Freq: Four times a day (QID) | ORAL | 0 refills | Status: AC | PRN

## 2022-04-06 MED ORDER — LACTATED RINGERS IV BOLUS
500.0000 mL | Freq: Once | INTRAVENOUS | Status: AC
  Administered 2022-04-06: 500 mL via INTRAVENOUS

## 2022-04-06 NOTE — TOC Progression Note (Signed)
Transition of Care (TOC) - Progression Note  ? ? ?Patient Details  ?Name: Kerry Randall ?MRN: [id number] ?Date of Birth: 1961/01/04 ? ?Transition of Care (TOC) CM/SW Contact  ?Vinie Sill, LCSW ?Phone Number: ?04/06/2022, 4:54 PM ? ?Clinical Narrative:    ? ?CSW informed patient and her daughter of bed offers. Their first choice was [facility]. CSW contacted [facility] and was advised they will not have availability until Monday or Tuesday.  ? ?CSW updated patient/family, their next SNF choice was [facility]. [facility] confirmed availability.  ? ?Insurance Josem Kaufmann was started and approved reference # [id number] from 05/13-05/16. CSW sent message to SNF, anticipated discharge tomorrow.  ? ?TOC will continue to follow and assist with discharge planning. ? ?Thurmond Butts, MSW, LCSW ?Clinical Social Worker ? ? ? ? ? ?Expected Discharge Plan: [place] ?Barriers to Discharge: Continued Medical Work up ? ?Expected Discharge Plan and Services ?Expected Discharge Plan: [place] ?  ?  ?  ?Living arrangements for the past 2 months: [place] ?                ?  ?  ?  ?  ?  ?  ?  ?  ?  ?  ? ? ?Social Determinants of Health (SDOH) Interventions ?  ? ?Readmission Risk Interventions ?   ? View : No data to display.  ?  ?  ?  ? ? ?

## 2022-04-06 NOTE — Progress Notes (Signed)
Physical Therapy Treatment ?Patient Details ?Name: Kerry Randall ?MRN: [id number] ?DOB: Jun 27, 1961 ?Today's Date: 04/06/2022 ? ? ?History of Present Illness Pt is a 61 y/o F presenting to ED from podiatrist on 5/4 with R diabetic food ulcer now s/p bilateral common iliac artery stenting 5/8. 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 ? ?  ?PT Comments  ? ? Pt admitted with above diagnosis. Pt was able to ambulate to door with min assist of 2 with chair follow but fatigues quickly.  Pt progressing slowly and will need therapy prior to d/c home.  Pt currently with functional limitations due to balance and endurance deficits. Pt will benefit from skilled PT to increase their independence and safety with mobility to allow discharge to the venue listed below.      ?Recommendations for follow up therapy are one component of a multi-disciplinary discharge planning process, led by the attending physician.  Recommendations may be updated based on patient status, additional functional criteria and insurance authorization. ? ?Follow Up Recommendations ? Skilled nursing-short term rehab (<3 hours/day) ?  ?  ?Assistance Recommended at Discharge Frequent or constant Supervision/Assistance  ?Patient can return home with the following Assist for transportation;A lot of help with walking and/or transfers;A lot of help with bathing/dressing/bathroom;Help with stairs or ramp for entrance ?  ?Equipment Recommendations ? None recommended by PT  ?  ?Recommendations for Other Services   ? ? ?  ?Precautions / Restrictions Precautions ?Precautions: Fall ?Precaution Comments: wound vac, ?Required Braces or Orthoses: Other Brace ?Other Brace: limb protector ?Restrictions ?Weight Bearing Restrictions: Yes ?RLE Weight Bearing: Non weight bearing  ?  ? ?Mobility ? Bed Mobility ?Overal bed mobility: Needs Assistance ?Bed Mobility: Supine to Sit ?  ?  ?Supine to sit: Min guard ?  ?  ?General  bed mobility comments: No assist to EOB. Did need some time to come to EOB ?  ? ?Transfers ?Overall transfer level: Needs assistance ?Equipment used: Rolling walker (2 wheels) ?Transfers: Sit to/from Stand, Bed to chair/wheelchair/BSC ?Sit to Stand: Min guard, Min assist, From elevated surface, +2 safety/equipment ?  ?  ?  ?  ?  ?General transfer comment: min guard for sit>stand, vc for hand placement for power up, good self steadying ?  ? ?Ambulation/Gait ?Ambulation/Gait assistance: Min guard, Min assist, +2 safety/equipment ?Gait Distance (Feet): 25 Feet ?Assistive device: Rolling walker (2 wheels) ?  ?  ?  ?  ?General Gait Details: Slow, steady gait with good step length hopping.  Notable fatigue at 25 feet with pt having more difficulty pushing on UEs to lift body for stepping. ? ? ?Stairs ?  ?  ?  ?  ?  ? ? ?Wheelchair Mobility ?  ? ?Modified Rankin (Stroke Patients Only) ?  ? ? ?  ?Balance Overall balance assessment: Needs assistance ?Sitting-balance support: Feet supported, No upper extremity supported ?Sitting balance-Leahy Scale: Good ?  ?  ?Standing balance support: During functional activity ?Standing balance-Leahy Scale: Poor ?Standing balance comment: requires B UE support to maintain balance ?  ?  ?  ?  ?  ?  ?  ?  ?  ?  ?  ?  ? ?  ?Cognition Arousal/Alertness: Awake/alert ?Behavior During Therapy: [facility] for tasks assessed/performed ?Overall Cognitive Status: Within Functional Limits for tasks assessed ?  ?  ?  ?  ?  ?  ?  ?  ?  ?  ?  ?  ?  ?  ?  ?  ?  ?  ?  ? ?  ?  Exercises Amputee Exercises ?Quad Sets: AROM, 5 reps, Both, Supine ?Gluteal Sets: AROM, Both, 10 reps, Seated ?Hip ABduction/ADduction: AROM, Seated, Both, 10 reps ?Hip Flexion/Marching: AROM, Both, 5 reps, Supine ?Straight Leg Raises: AROM, Both, 10 reps, Seated ?Other Exercises ?Other Exercises: Removed limb guard and repositioned after exercises as it had slid down and wasnt in the correct place. ? ?  ?General Comments   ?  ?  ? ?Pertinent  Vitals/Pain Pain Assessment ?Pain Assessment: No/denies pain  ? ? ?Home Living   ?  ?  ?  ?  ?  ?  ?  ?  ?  ?   ?  ?Prior Function    ?  ?  ?   ? ?PT Goals (current goals can now be found in the care plan section) Acute Rehab PT Goals ?Patient Stated Goal: to go home ?Progress towards PT goals: Progressing toward goals ? ?  ?Frequency ? ? ? Min 3X/week ? ? ? ?  ?PT Plan Current plan remains appropriate  ? ? ?Co-evaluation   ?  ?  ?  ?  ? ?  ?AM-PAC PT "6 Clicks" Mobility   ?Outcome Measure ? Help needed turning from your back to your side while in a flat bed without using bedrails?: None ?Help needed moving from lying on your back to sitting on the side of a flat bed without using bedrails?: A Little ?Help needed moving to and from a bed to a chair (including a wheelchair)?: A Little ?Help needed standing up from a chair using your arms (e.g., wheelchair or bedside chair)?: A Little ?Help needed to walk in hospital room?: Total ?Help needed climbing 3-5 steps with a railing? : Total ?6 Click Score: 15 ? ?  ?End of Session Equipment Utilized During Treatment: Gait belt ?Activity Tolerance: Patient tolerated treatment well ?Patient left: with call bell/phone within reach;in chair;with chair alarm set ?Nurse Communication: Mobility status ?PT Visit Diagnosis: Difficulty in walking, not elsewhere classified (R26.2) ?  ? ? ?Time: 1012-1030 ?PT Time Calculation (min) (ACUTE ONLY): 18 min ? ?Charges:  $Gait Training: 8-22 mins          ?          ? ?Ayaansh Smail M,PT ?Acute Rehab Services ?[phone] ?[phone] (pager)  ? ? ?Jarielys Girardot F Wyeth Hoffer ?04/06/2022, 1:16 PM ? ?

## 2022-04-06 NOTE — Progress Notes (Signed)
?      ?                 PROGRESS NOTE ? ?      ?PATIENT DETAILS ?Name: Kerry Randall ?Age: 61 y.o. ?Sex: female ?Date of Birth: [birth year] ?Admit Date: 03/29/2022 ?Admitting Physician Vianne Bulls, MD ?[id number], [place] ? ?Brief Summary: ?Patient is a 61 y.o.  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  See below for further details. ? ?Significant events: ?5/4>> admit to [facility] for necrotic right TMA stump. ? 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 ?5/8 >> bilateral common iliac artery stenting   ?5/10 >> R. BKA - Dr Sharol Given ? ?Significant microbiology data: ?5/4>> COVID/influenza PCR: Negative ?5/4>> blood culture: No growth ? ?Procedures: ?None ? ?Consults: ?Orthopedics, vascular surgery ? ?Subjective:  Patient in bed, appears comfortable, denies any headache, no fever, no chest pain or pressure, no shortness of breath , no abdominal pain. No new focal weakness. ? ? ?Objective: ?Vitals: ?Blood pressure (!) 98/53, pulse 78, temperature 98.9 ?F (37.2 ?C), temperature source Oral, resp. rate 17, height 5\' 9"  (1.753 m), weight 89.4 kg, SpO2 98 %.  ? ?Exam: ? ?Awake Alert, No new F.N deficits, Normal affect ?[place].AT,PERRAL ?Supple Neck, No JVD,   ?Symmetrical Chest wall movement, Good air movement bilaterally, CTAB ?RRR,No Gallops, Rubs or new Murmurs,  ?+ve B.Sounds, Abd Soft, No tenderness,   ?Right BKA stump under bandage with wound VAC in place. ? ? ?Assessment/Plan: ? ?Necrotic/nonhealing right TMA stump/critical right limb ischemia:  Vascular surgery/orthopedics following - for now Continue empiric IV antibiotics she is s/p  bilateral common iliac artery stenting by vascular surgeon Dr. Standley Dakins on 04/02/2022, seen by Dr. Sharol Given underwent right BKA on 04/04/2022,  patient agreeable for blood transfusion during the perioperative period.  Currently has postop dressing with wound VAC, will likely go to SNF, case discussed with Dr. Sharol Given stop antibiotics on 04/06/2022, SNF likely on 04/07/2022 ? ?PAF: Rate controlled-continue metoprolol-Eliquis on hold for possible angiogram Monday-in the interim-we will start IV heparin.  CHA2DS2-VASc of at least 4.  As needed IV Lopressor added. ? ?PAD-s/p right common iliac artery stent June 2022-angioplasty of distal right SFA: Now status post Bilateral common iliac artery stenting on 04/02/2022, she is currently on aspirin and statin for secondary prevention.  Eliquis resumed on 04/06/2022. ? ?CAD s/p PCI to RCA 2016: No anginal symptoms-continue aspirin statin and anticoagulation as above.  No acute issues. ? ?Peripheral neuropathy: Continue Neurontin/Cymbalta ? ?Hypertension.  Blood pressure slightly soft on 04/06/2022, gentle IV bolus, monitor on low-dose beta-blocker.   ? ?CKD stage IIIa: Creatinine close to baseline-follow periodically. ? ?Normocytic anemia: Mild-likely due to combination of acute illness/CKD-follow CBC periodically. ? ?DM-2 (A1c 7.0 on 5/5): CBGs relatively stable-continue 5 units of Semglee and SSI.  Follow and adjust.  Include D5W drip while she is n.p.o. for surgery on 04/04/2022 with every 2 hour Accu-Cheks. ? ?Recent Labs  ?  04/05/22 ?[id number] 04/05/22 ?2113 04/06/22 ?0617  ?GLUCAP 211* 117* 125*  ? ? ?Pressure Ulcer: ?  ? ?Obesity: ?Estimated body mass index is 29.09 kg/m? as calculated from the following: ?  Height as of this encounter: 5\' 9"  (1.753 m). ?  Weight as of this encounter: 89.4 kg.  ? ?Code status: ?  Code Status: Full Code  ? ?DVT Prophylaxis ?SCD's Start: 04/04/22 1414 ?Place and maintain sequential compression device Start: 03/29/22 2235 ?Starting IV heparin 5/6. ?apixaban (ELIQUIS) tablet 5 mg   ?Family Communication: She prefers to update family herself-I have asked her to let us know if she changes her  mind or if her family members have questions. ? ? ?Disposition Plan: ?Status is: Inpatient ?Remains inpatient appropriate because: Necrotic right TMA stump/critical limb ischemia-not stable for discharge. ?  ?Planned Discharge Destination:SNF likely on 04/07/2022 ? ? ?Diet: ?Diet Order   ? ?       ?  Diet Carb Modified Fluid consistency: Thin; Room service appropriate? Yes  Diet effective now       ?  ? ?  ?  ? ?  ?  ? ? ?MEDICATIONS: ?Scheduled Meds: ? apixaban  5 mg Oral BID  ? vitamin C  1,000 mg Oral Daily  ? aspirin EC  81 mg Oral Daily  ? atorvastatin  40 mg Oral Daily  ? docusate sodium  100 mg Oral Daily  ? DULoxetine  60 mg Oral BID  ? feeding supplement  237 mL Oral BID BM  ? fesoterodine  8 mg Oral Daily  ? gabapentin  1,200 mg Oral BID  ? hydrOXYzine  75 mg Oral Daily  ? insulin aspart  0-9 Units Subcutaneous TID WC & HS  ? insulin glargine-yfgn  8 Units Subcutaneous Daily  ? metoprolol succinate  25 mg Oral Daily  ? mirabegron ER  25 mg Oral Daily  ? nutrition supplement (JUVEN)  1 packet Oral BID BM  ? pantoprazole  40 mg Oral Daily  ? polyethylene glycol  17 g Oral Daily  ? sodium chloride flush  3 mL Intravenous Q12H  ? zinc sulfate  220 mg Oral Daily  ? ?Continuous Infusions: ? sodium chloride    ? lactated ringers    ? magnesium sulfate bolus IVPB    ? ?PRN Meds:.sodium chloride, acetaminophen **OR** acetaminophen, acetaminophen, albuterol, alum & mag hydroxide-simeth, bisacodyl, guaiFENesin-dextromethorphan, hydrALAZINE, HYDROcodone-acetaminophen, labetalol, magnesium sulfate bolus IVPB, metoprolol tartrate, metoprolol tartrate, ondansetron ? ? ?I have personally reviewed following labs and imaging studies ? ?LABORATORY DATA: ? ?Recent Labs  ?Lab 04/02/22 ?0156 04/03/22 ?0109 04/04/22 ?0244 04/05/22 ?0129 04/06/22 ?0158  ?WBC 8.8 9.4 9.7 9.4 8.6  ?HGB 10.9* 10.1* 9.6* 10.0* 9.9*  ?HCT 34.1* 30.6* 30.0* 32.0* 30.7*  ?PLT 192 190 172 201 168  ?MCV 90.2 88.7 90.1 91.4 90.6  ?MCH 28.8 29.3 28.8 28.6  29.2  ?MCHC 32.0 33.0 32.0 31.3 32.2  ?RDW 13.6 13.4 13.6 13.8 13.9  ?LYMPHSABS 1.7 0.8 2.7 2.0  --   ?MONOABS 0.8 0.8 0.9 0.8  --   ?EOSABS 0.4 0.0 0.3 0.2  --   ?BASOSABS 0.0 0.0 0.0 0.0  --   ? ? ?Recent Labs  ?Lab 03/31/22 ?0031 04/01/22 ?0335 04/02/22 ?[id number] 04/03/22 ?0109 04/04/22 ?[id number] 04/05/22 ?0129 04/06/22 ?0158  ?NA 137   < > 138 136 139 140 134*  ?K 4.7   < > 3.7 4.1 3.8 3.9 3.9  ?CL 108   < > 111 110 112* 110 107  ?CO2 24   < > 23 20* 20* 23 19*  ?GLUCOSE 225*   < > 168* 304* 206* 144* 139*  ?BUN 16   < > 21 24* 31* 29* 24*  ?CREATININE 1.05*   < > 1.00 0.96 0.98 0.97 0.95  ?CALCIUM 8.7*   < > 8.8* 8.5* 8.6*  8.4* 8.0*  ?AST  --   --  11* 10* 11* 15 14*  ?ALT  --   --  9 11 11 12 11   ?ALKPHOS  --   --  84 76 64 69 59  ?BILITOT  --   --  0.2* 0.6 0.5 0.3 0.4  ?ALBUMIN  --   --  2.2* 2.1* 2.1* 2.2* 2.2*  ?MG  --   --  1.8 1.7 1.6* 1.8 1.7  ?CRP 16.2*  --   --   --   --   --   --   ?BNP  --   --  243.6* 474.1* 236.9* 402.8*  --   ? < > = values in this interval not displayed.  ? ?RADIOLOGY STUDIES/RESULTS: ?No results found. ? ? LOS: 8 days  ? ?Signature ? ?Lala Lund M.D on 04/06/2022 at 8:46 AM   -  To page go to [email or web address]  ? ? ?

## 2022-04-06 NOTE — Progress Notes (Signed)
Occupational Therapy Treatment ?Patient Details ?Name: Kerry Randall ?MRN: [id number] ?DOB: 1961-01-10 ?Today's Date: 04/06/2022 ? ? ?History of present illness Pt is a 61 y/o F presenting to ED from podiatrist on 5/4 with R diabetic food ulcer now s/p bilateral common iliac artery stenting 5/8. 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 ?  ?OT comments ? Patient continues to make incremental progress towards goals in skilled OT session. Patient's session limited in scope due to fatigue and minimal participation. OT walking by patients room with patient observed to be sleeping on the very edge of the bed, with legs nearly off the edge of the bed. Patient easily aroused and engaging in bed mobility and sitting up EOB at end of session. Patient declining transfers, working on scooting in the bed, and declining ADLs stating that she was comfortable where she was and was waiting for maintenance to fix her TV. RN notified at end of session of patient's position, OT will continue to follow.   ? ?Recommendations for follow up therapy are one component of a multi-disciplinary discharge planning process, led by the attending physician.  Recommendations may be updated based on patient status, additional functional criteria and insurance authorization. ?   ?Follow Up Recommendations ? Skilled nursing-short term rehab (<3 hours/day)  ?  ?Assistance Recommended at Discharge Frequent or constant Supervision/Assistance  ?Patient can return home with the following ? A little help with walking and/or transfers;A little help with bathing/dressing/bathroom;Assistance with cooking/housework;Assist for transportation;Help with stairs or ramp for entrance ?  ?Equipment Recommendations ? Tub/shower bench;BSC/3in1  ?  ?Recommendations for Other Services   ? ?  ?Precautions / Restrictions Precautions ?Precautions: Fall ?Precaution Comments: wound vac, ?Required Braces or Orthoses: Other  Brace ?Other Brace: limb protector ?Restrictions ?Weight Bearing Restrictions: Yes ?RLE Weight Bearing: Non weight bearing  ? ? ?  ? ?Mobility Bed Mobility ?Overal bed mobility: Needs Assistance ?Bed Mobility: Supine to Sit ?  ?  ?Supine to sit: Min guard ?  ?  ?General bed mobility comments: Sleeping on the very edge of the bed, but easily aroused and engaging in bed mobility and sitting up EOB at end of session ?  ? ?Transfers ?  ?  ?  ?  ?  ?  ?  ?  ?  ?General transfer comment: Declining this session ?  ?  ?Balance Overall balance assessment: Needs assistance ?Sitting-balance support: Feet supported, No upper extremity supported ?Sitting balance-Leahy Scale: Good ?  ?  ?  ?  ?  ?  ?  ?  ?  ?  ?  ?  ?  ?  ?  ?  ?   ? ?ADL either performed or assessed with clinical judgement  ? ?ADL Overall ADL's : Needs assistance/impaired ?  ?  ?  ?  ?  ?  ?  ?  ?  ?  ?  ?  ?  ?  ?  ?  ?  ?  ?Functional mobility during ADLs: Minimal assistance;Rolling walker (2 wheels) ?General ADL Comments: Minimal session due to participation ?  ? ?Extremity/Trunk Assessment   ?  ?  ?  ?  ?  ? ?Vision   ?  ?  ?Perception   ?  ?Praxis   ?  ? ?Cognition Arousal/Alertness: Awake/alert ?Behavior During Therapy: [facility] for tasks assessed/performed ?Overall Cognitive Status: Within Functional Limits for tasks assessed ?  ?  ?  ?  ?  ?  ?  ?  ?  ?  ?  ?  ?  ?  ?  ?  ?  General Comments: Sleeping on the very edge of the bed, but easily aroused and engaging in bed mobility and sitting up EOB at end of session ?  ?  ?   ?Exercises   ? ?  ?Shoulder Instructions   ? ? ?  ?General Comments    ? ? ?Pertinent Vitals/ Pain       Pain Assessment ?Pain Assessment: Faces ?Faces Pain Scale: Hurts a little bit ?Pain Descriptors / Indicators: Sore, Guarding ?Pain Intervention(s): Limited activity within patient's tolerance, Monitored during session, Repositioned ? ?Home Living   ?  ?  ?  ?  ?  ?  ?  ?  ?  ?  ?  ?  ?  ?  ?  ?  ?  ?  ? ?  ?Prior Functioning/Environment     ?  ?  ?  ?   ? ?Frequency ? Min 2X/week  ? ? ? ? ?  ?Progress Toward Goals ? ?OT Goals(current goals can now be found in the care plan section) ? Progress towards OT goals: Progressing toward goals ? ?Acute Rehab OT Goals ?Patient Stated Goal: to get my TV to work ?OT Goal Formulation: With patient ?Time For Goal Achievement: 04/19/22 ?Potential to Achieve Goals: Fair  ?Plan Discharge plan remains appropriate   ? ?Co-evaluation ? ? ?   ?  ?  ?  ?  ? ?  ?AM-PAC OT "6 Clicks" Daily Activity     ?Outcome Measure ? ? Help from another person eating meals?: None ?Help from another person taking care of personal grooming?: None ?Help from another person toileting, which includes using toliet, bedpan, or urinal?: A Little ?Help from another person bathing (including washing, rinsing, drying)?: A Little ?Help from another person to put on and taking off regular upper body clothing?: None ?Help from another person to put on and taking off regular lower body clothing?: A Lot ?6 Click Score: 20 ? ?  ?End of Session   ? ?OT Visit Diagnosis: Unsteadiness on feet (R26.81) ?  ?Activity Tolerance Patient limited by fatigue ?  ?Patient Left in bed;with call bell/phone within reach ?  ?Nurse Communication Mobility status;Other (comment) (Sleeping on very edge of bed therefore OT intervening and repositioning) ?  ? ?   ? ?Time: 1610-9604 ?OT Time Calculation (min): 8 min ? ?Charges: OT General Charges ?$OT Visit: 1 Visit ?OT Treatments ?$Self Care/Home Management : 8-22 mins ? ?Kerry Randall, OTR/L ?Acute Rehabilitation Services ?[phone] ?[phone]  ? ?Kerry Randall ?04/06/2022, 2:49 PM ?

## 2022-04-07 LAB — GLUCOSE, CAPILLARY
Glucose-Capillary: 164 mg/dL — ABNORMAL HIGH (ref 70–99)
Glucose-Capillary: 241 mg/dL — ABNORMAL HIGH (ref 70–99)

## 2022-04-07 MED ORDER — INSULIN ASPART 100 UNIT/ML IJ SOLN
0.0000 [IU] | Freq: Three times a day (TID) | INTRAMUSCULAR | Status: DC
  Administered 2022-04-07: 5 [IU] via SUBCUTANEOUS
  Administered 2022-04-07: 3 [IU] via SUBCUTANEOUS

## 2022-04-07 MED ORDER — INSULIN LISPRO (1 UNIT DIAL) 100 UNIT/ML (KWIKPEN): PEN_INJECTOR | SUBCUTANEOUS | 0 refills | Status: AC

## 2022-04-07 MED ORDER — ATORVASTATIN CALCIUM 40 MG PO TABS
40.0000 mg | ORAL_TABLET | Freq: Every day | ORAL | Status: AC
Start: 2022-04-07 — End: ?

## 2022-04-07 MED ORDER — INSULIN ASPART 100 UNIT/ML IJ SOLN: 0.0000 [IU] | Freq: Every day | INTRAMUSCULAR | Status: DC

## 2022-04-07 MED ORDER — INSULIN GLARGINE-YFGN 100 UNIT/ML ~~LOC~~ SOLN
12.0000 [IU] | Freq: Every day | SUBCUTANEOUS | Status: DC
  Administered 2022-04-07: 12 [IU] via SUBCUTANEOUS
  Filled 2022-04-07: qty 0.12

## 2022-04-07 MED ORDER — ASPIRIN 81 MG PO TBEC: 81.0000 mg | DELAYED_RELEASE_TABLET | Freq: Every day | ORAL | 11 refills | Status: AC

## 2022-04-07 MED ORDER — METOPROLOL SUCCINATE ER 50 MG PO TB24
50.0000 mg | ORAL_TABLET | Freq: Every day | ORAL | Status: DC
  Administered 2022-04-07: 50 mg via ORAL
  Filled 2022-04-07: qty 1

## 2022-04-07 NOTE — TOC Transition Note (Signed)
Transition of Care (TOC) - CM/SW Discharge Note ? ? ?Patient Details  ?Name: Kerry Randall ?MRN: [id number] ?Date of Birth: Sep 12, 1961 ? ?Transition of Care (TOC) CM/SW Contact:  ?Patrice Paradise, LCSW ?Phone [phone] ?04/07/2022, 11:00 AM ? ? ?Clinical Narrative:    ? ?Patient will DC [facility] ?Anticipated DC date: 04/07/2022 ?Family notified:?Melissa ?Transport by: Sharin Mons ?  ?Per MD patient ready for [facility] to [facility]. RN, patient, patient's family, and facility notified of DC. Discharge Summary sent to facility. RN given number for report [phone] room 304. DC packet on chart. Ambulance transport requested for patient.  ? ?CSW signing off. ?  ?Judd Lien, LCSW ?[phone]  ? ?Final next level of care: Skilled Nursing Facility ?Barriers to Discharge: Barriers Resolved ? ? ?Patient Goals and CMS Choice ?  ?  ?  ? ?Discharge Placement ?  ?           ?Patient chooses bed at: [facility] ?Patient to be transferred to facility by: [facility] ?Name of family member notified: Melissa ?Patient and family notified of of transfer: 04/07/22 ? ?Discharge Plan and Services ?  ?  ?           ?  ?  ?  ?  ?  ?  ?  ?  ?  ?  ? ?Social Determinants of Health (SDOH) Interventions ?  ? ? ?Readmission Risk Interventions ?   ? View : No data to display.  ?  ?  ?  ? ? ? ? ? ?

## 2022-04-07 NOTE — Progress Notes (Signed)
?  CSW was alerted that pt's phone was left by [facility]. CSW attempted to call pt's daughter to ascertain  if she could come get pt's phone however CSW was unable to reach her. ?

## 2022-04-07 NOTE — Progress Notes (Signed)
Patient ID: Kerry Randall, female   DOB: 1961-05-19, 61 y.o.   MRN: [id number] ?Patient is status post transtibial amputation.  I changed her over from the hospital back to the [facility] wound VAC.  This has a good suction fit.  Plan for discharge to skilled nursing with the portable pump.  Patient will discontinue the Praveena at 1 week and start wearing the stump shrinker's.  I will follow-up in 1 week. ?

## 2022-04-07 NOTE — Discharge Summary (Signed)
?                                                                                ? Kerry Randall [id number] DOB: September 28, 1961 DOA: 03/29/2022 ? ?PCP: [facility] [place] ? ?Admit date: 03/29/2022  Discharge date: 04/07/2022 ? ?Admitted From: Home   Disposition:  SNF ? ? ?Recommendations for Outpatient Follow-up:  ? ?Follow up with PCP in 1-2 weeks ? ?PCP Please obtain BMP/CBC, 2 view CXR in 1week,  (see Discharge instructions)  ? ?PCP Please follow up on the following pending results:  ? ? ?Home Health: None   ?Equipment/Devices: None  ?Consultations: Ortho, VVS ?Discharge Condition: Stable    ?CODE STATUS: Full    ?Diet Recommendation: Heart Healthy Low Carb ?  ? ?Chief Complaint  ?Patient presents with  ? Wound Check  ?  ? ?Brief history of present illness from the day of admission and additional interim summary   ? ?61 y.o.  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  See below for further details. ?  ?Significant events: ?5/4>> admit to [facility] for necrotic right TMA stump. ?  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 ?5/8 >> bilateral common iliac artery stenting   ?5/10 >> R. BKA - Dr Sharol Given ?  ?Significant microbiology data: ?5/4>> COVID/influenza PCR: Negative ?5/4>> blood culture: No growth ?  ?Procedures: ?None ?  ?Consults: ?Orthopedics, vascular surgery ? ?                                                               Hospital Course  ? ?Necrotic/nonhealing right TMA stump/critical right limb ischemia:  Vascular surgery/orthopedics following - for now Continue empiric IV antibiotics she is s/p  bilateral common iliac artery stenting by vascular surgeon Dr. Standley Dakins on  04/02/2022, seen by Dr. Sharol Given underwent right BKA on 04/04/2022, patient agreeable for blood transfusion during the perioperative period.  Currently has postop dressing with wound VAC, will likely go to SNF, case discussed with Dr. Sharol Given stop antibiotics on 04/06/2022, SNF likely on 04/07/2022, post discharge follow-up with Dr. Sharol Given within a week and vascular surgeon within 2 weeks ?  ?PAF: Rate controlled-continue metoprolol-Eliquis, stable no acute issues.  Mali vas 2 score of at least 4. ?  ?PAD-s/p right common iliac artery stent June 2022-angioplasty of distal right SFA: Now status post Bilateral common iliac artery stenting on 04/02/2022, she is currently on aspirin and statin for secondary prevention.  Eliquis resumed on 04/06/2022. ?  ?CAD s/p PCI to RCA 2016: No anginal symptoms-continue aspirin statin and anticoagulation as above.  No acute issues. ?  ?Peripheral neuropathy: Continue Neurontin/Cymbalta ?  ?Hypertension.  Blood pressure [place] stable only on beta-blocker.  ACE inhibitor discontinued for now as blood pressure too low for a  second medication. ?  ?CKD stage IIIa: Creatinine close to baseline-follow periodically at [facility] ?  ?Normocytic anemia: Mild-likely due to combination of acute illness/CKD-follow CBC periodically at SNF. ?  ?DM-2 (A1c 7.0 on 5/5): Continue home regimen, check CBGs q. ACH S at SNF and adjust insulin dose as needed. ? ? ?Discharge diagnosis   ? ? ?Principal Problem: ?  Diabetic infection of right foot ([place]) ?Active Problems: ?  Stage 3a chronic kidney disease (CKD) ([place]) ?  Atrial fibrillation ([place]) ?  Insulin-requiring or dependent type II diabetes mellitus ([place]) ?  PAD (peripheral artery disease) ([place]) ?  Major depressive disorder, recurrent ([place]) ?  CAD (coronary artery disease) ?  Severe protein-calorie malnutrition ([place]) ? ? ? ?Discharge instructions   ? ?Discharge Instructions   ? ? Discharge instructions   Complete by: As directed ?  ? Follow with Primary MD [place] in 7 days  ? ?Get CBC, CMP, 2 view Chest X ray -  checked next visit within 1 week by Primary MD or SNF MD   ? ?Activity: As tolerated with Full fall precautions use walker/cane & assistance as needed ? ?Disposition SNF ? ?Diet: Heart Healthy Low Carb.  Check CBGs q. ACH S and adjust Lantus dose as needed. ? ?Special Instructions: If you have smoked or chewed Tobacco  in the last 2 yrs please stop smoking, stop any regular Alcohol  and or any Recreational drug use. ? ?On your next visit with your primary care physician please Get Medicines reviewed and adjusted. ? ?Please request your Prim.MD to go over all Hospital Tests and Procedure/Radiological results at the follow up, please get all Hospital records sent to your Prim MD by signing hospital release before you go home. ? ?If you experience worsening of your admission symptoms, develop shortness of breath, life threatening emergency, suicidal or homicidal thoughts you must seek medical attention immediately by calling 911 or calling your MD immediately  if symptoms less severe. ? ?You Must read complete instructions/literature along with all the possible adverse reactions/side effects for all the Medicines you take and that have been prescribed to you. Take any new Medicines after you have completely understood and accpet all the possible adverse reactions/side effects.  ? Discharge wound care:   Complete by: As directed ?  ? Continue negative pressure wound care of the right stump with Praveena pump, follow-up with Dr. Sharol Given within a week of discharge for further wound care instructions and removal of the pump.  Dressing change at Dr. Jess Barters office within a week.  ? Increase activity slowly   Complete by: As directed ?  ? Negative Pressure Wound Therapy - Incisional   Complete by: As directed ?  ? ?  ? ? ?Discharge Medications  ? ?Allergies as of 04/07/2022   ? ?   Reactions  ? Erythromycin Hives  ? Iodinated Contrast Media Swelling  ? Levofloxacin Hives,  Swelling  ? Oxycodone-acetaminophen Anaphylaxis  ? "Percocet" ?"Percocet"  ? Penicillins Anaphylaxis  ? Hypotension  ? Rosuvastatin Rash, Hives, Other (See Comments)  ? Muscle Cramps and weakness  ? Sulfa Antibiotics Hives  ? ?  ? ?  ?Medication List  ?  ? ?STOP taking these medications   ? ?lipase/protease/amylase 36000 UNITS Cpep capsule ?Commonly known as: CREON ?  ?lisinopril 30 MG tablet ?Commonly known as: ZESTRIL ?  ?Lumify 0.025 % Soln ?Generic drug: Brimonidine Tartrate ?  ?metFORMIN 500 MG 24 hr tablet ?Commonly known as: GLUCOPHAGE-XR ?  ?  traMADol 50 MG tablet ?Commonly known as: ULTRAM ?  ? ?  ? ?TAKE these medications   ? ?albuterol 108 (90 Base) MCG/ACT inhaler ?Commonly known as: VENTOLIN HFA ?Inhale 1-2 puffs into the lungs every 6 (six) hours as needed for wheezing or shortness of breath. ?  ?ascorbic acid 500 MG tablet ?Commonly known as: VITAMIN C ?Take 500 mg by mouth in the morning. ?  ?aspirin 81 MG EC tablet ?Take 1 tablet (81 mg total) by mouth daily. Swallow whole. ?  ?atorvastatin 40 MG tablet ?Commonly known as: LIPITOR ?Take 1 tablet (40 mg total) by mouth daily. ?  ?B-complex with vitamin C tablet ?Take 1 tablet by mouth 3 (three) times a week. (on cleaning days) ?  ?Biotin 5000 MCG Tabs ?Take 5,000 mcg by mouth in the morning. ?  ?COSAMIN DS PO ?Take 2 tablets by mouth in the morning. ?  ?dapagliflozin propanediol 10 MG Tabs tablet ?Commonly known as: FARXIGA ?Take 10 mg by mouth in the morning. ?  ?DULoxetine 60 MG capsule ?Commonly known as: CYMBALTA ?Take 60 mg by mouth 2 (two) times daily. ?  ?Eliquis 5 MG Tabs tablet ?Generic drug: apixaban ?Take 5 mg by mouth 2 (two) times daily. ?  ?ferrous sulfate 325 (65 FE) MG tablet ?Take 325 mg by mouth in the morning. ?  ?Fish Oil 1000 MG Caps ?Take 1,000 mg by mouth every other day. In the morning ?  ?gabapentin 600 MG tablet ?Commonly known as: NEURONTIN ?Take 1,200 mg by mouth 2 (two) times daily. ?  ?gemfibrozil 600 MG tablet ?Commonly  known as: LOPID ?Take 600 mg by mouth 2 (two) times daily. ?  ?HYDROcodone-acetaminophen 5-325 MG tablet ?Commonly known as: NORCO/VICODIN ?Take 1 tablet by mouth every 6 (six) hours as needed for moderate pai

## 2022-04-07 NOTE — Progress Notes (Signed)
Mobility Specialist Progress Note ? ? 04/07/22 0924  ?Mobility  ?Activity Ambulated with assistance in room  ?Level of Assistance Standby assist, set-up cues, supervision of patient - no hands on  ?Assistive Device Front wheel walker  ?RLE Weight Bearing NWB  ?Distance Ambulated (ft) 48 ft  ?Activity Response Tolerated well  ?$Mobility charge 1 Mobility  ? ?Received on EOB having no complaints and agreeable. Requiring no physical assist throughout ambulation but L knee getting fatigued. Returned back to EOB w/o fault and call bell in reach. ? ?Kerry Randall ?Mobility Specialist ?Phone Number [phone] ? ?

## 2022-04-07 NOTE — Progress Notes (Signed)
Made multiple attempts to call and give report to [facility] with no response.    ?

## 2022-04-07 NOTE — Discharge Instructions (Signed)
Follow with Primary [facility] in 7 days  ? ?Get CBC, CMP, 2 view Chest X ray -  checked next visit within 1 week by Primary MD or SNF MD   ? ?Activity: As tolerated with Full fall precautions use walker/cane & assistance as needed ? ?Disposition SNF ? ?Diet: Heart Healthy Low Carb.  Check CBGs q. ACH S and adjust Lantus dose as needed. ? ?Special Instructions: If you have smoked or chewed Tobacco  in the last 2 yrs please stop smoking, stop any regular Alcohol  and or any Recreational drug use. ? ?On your next visit with your primary care physician please Get Medicines reviewed and adjusted. ? ?Please request your Prim.MD to go over all Hospital Tests and Procedure/Radiological results at the follow up, please get all Hospital records sent to your Prim MD by signing hospital release before you go home. ? ?If you experience worsening of your admission symptoms, develop shortness of breath, life threatening emergency, suicidal or homicidal thoughts you must seek medical attention immediately by calling 911 or calling your MD immediately  if symptoms less severe. ? ?You Must read complete instructions/literature along with all the possible adverse reactions/side effects for all the Medicines you take and that have been prescribed to you. Take any new Medicines after you have completely understood and accpet all the possible adverse reactions/side effects.  ? ?  ?

## 2022-04-07 NOTE — Progress Notes (Signed)
Pt discharged to [facility] facility via [facility] service. AVS sent with [facility] service.     ?

## 2022-04-07 NOTE — TOC Progression Note (Addendum)
Transition of Care (TOC) - Progression Note  ? ? ?Patient Details  ?Name: Kerry Randall ?MRN: [id number] ?Date of Birth: 1961-06-08 ? ?Transition of Care (TOC) CM/SW Contact  ?Patrice Paradise, LCSW ?Phone Number: [phone] ?04/07/2022, 10:28 AM ? ?Clinical Narrative:    ?CSW was alerted about pt's pending DC today. CSW spoke with Revonda Standard at facility and she informed CSW that family must bring pt's Repatha and Trulicity to facility. CSW attempted to call pt's daughter Efraim Kaufmann however had to leave message. ? ?10:59 Am: CSW spoke with Revonda Standard from facility and she informed CSW that she had spoken to daughter and she is aware of the medications needing to brought to facility however that will not hold up discharge today. ? ?TOC team will continue to assist with discharge planning needs.  ? ? ?Expected Discharge Plan: Skilled Nursing Facility ?Barriers to Discharge: Barriers Resolved ? ?Expected Discharge Plan and Services ?Expected Discharge Plan: Skilled Nursing Facility ?  ?  ?  ?Living arrangements for the past 2 months: Single Family Home ?Expected Discharge Date: 04/07/22               ?  ?  ?  ?  ?  ?  ?  ?  ?  ?  ? ? ?Social Determinants of Health (SDOH) Interventions ?  ? ?Readmission Risk Interventions ?   ? View : No data to display.  ?  ?  ?  ? ? ?

## 2022-04-09 ENCOUNTER — Encounter (HOSPITAL_COMMUNITY): Payer: Self-pay | Admitting: Orthopedic Surgery

## 2022-04-09 LAB — SURGICAL PATHOLOGY

## 2022-04-10 ENCOUNTER — Telehealth: Payer: Self-pay

## 2022-04-10 NOTE — Telephone Encounter (Signed)
SW [place], advised okay to take off wound vac today. She did report no drainage in canister. She will apply gauze and ace wrap over stump. Pt is wearing her limb protector ?

## 2022-04-10 NOTE — Telephone Encounter (Signed)
[place], wound care nurse with [facility] called stating that patient's wound vac is no longer working and wanted to know if she should remove it and apply a wet to dry or just leave it on until patient's appt.tomorrow?  CB# [phone] or [phone].  Please advise.  Thank you. ?

## 2022-04-11 ENCOUNTER — Ambulatory Visit (INDEPENDENT_AMBULATORY_CARE_PROVIDER_SITE_OTHER): Payer: Medicare Other | Admitting: Family

## 2022-04-11 ENCOUNTER — Encounter: Payer: Self-pay | Admitting: Family

## 2022-04-11 DIAGNOSIS — I96 Gangrene, not elsewhere classified: Secondary | ICD-10-CM

## 2022-04-11 NOTE — Progress Notes (Signed)
? ?Post-Op Visit Note ?  ?Patient: Kerry Randall           ?Date of Birth: 04-04-1961           ?MRN: [id number] ?Visit Date: 04/11/2022 ?PCP: [facility] [place] ? ?Chief Complaint:  ?Chief Complaint  ?Patient presents with  ? Right Leg - Routine Post Op  ?  04/04/22 right BKA with Kerecis   ? ? ?HPI:  ?HPI ?The patient is a 61 year old woman seen in follow-up for right below-knee amputation on May 10 that she did have Kerecis placement as well.  She is residing at the [facility] she has been having daily physical therapy she did unfortunately fall twice yesterday with direct impact on her residual limb. ? ?Ortho Exam ?On examination of the right residual limb her incision is well approximated there is no gaping there is scant bloody drainage no erythema minimal edema ? ?Visit Diagnoses: No diagnosis found. ? ?Plan: Begin daily dose of cleansing.  Dry dressing changes.  Continue shrinker.  Continue limb protector.  Follow-up in 2 weeks for suture removal. ? ?Follow-Up Instructions: No follow-ups on file.  ? ?Imaging: ?No results found. ? ?Orders:  ?No orders of the defined types were placed in this encounter. ? ?No orders of the defined types were placed in this encounter. ? ? ? ?PMFS History: ?Patient Active Problem List  ? Diagnosis Date Noted  ? Severe protein-calorie malnutrition ([place])   ? Diabetic infection of right foot ([place]) 03/29/2022  ? Stage 3a chronic kidney disease (CKD) ([place]) 03/29/2022  ? Atrial fibrillation ([place])   ? Insulin-requiring or dependent type II diabetes mellitus ([place])   ? PAD (peripheral artery disease) ([place])   ? Major depressive disorder, recurrent ([place])   ? CAD (coronary artery disease)   ? Gangrene of right foot ([place])   ? Critical lower limb ischemia ([place]) 04/26/2021  ? ?Past Medical History:  ?Diagnosis Date  ? Atrial fibrillation ([place])   ? Colon polyps   ? Diabetes mellitus without complication ([place])   ? GERD (gastroesophageal reflux disease)   ? Heart disease, hyperkinetic   ?  Hypercholesteremia   ? Hypertension   ? Kidney disease   ? Leg pain   ? Major depressive disorder, recurrent ([place])   ? Palpitations   ? Vitamin D deficiency   ?  ?Family History  ?Problem Relation Age of Onset  ? Hypertension Mother   ? Diabetes type II Mother   ? Heart disease Mother   ? Throat cancer Father   ? Hypertension Sister   ? Depression Sister   ? Brain cancer Sister   ? Hypertension Maternal Aunt   ? Hyperlipidemia Maternal Aunt   ? Hypertension Maternal Uncle   ? Hyperlipidemia Maternal Uncle   ? Hypertension Paternal Aunt   ? Hyperlipidemia Paternal Aunt   ? Heart disease Paternal Aunt   ? Hypertension Paternal Uncle   ? Hyperlipidemia Paternal Uncle   ? Diabetes type II Daughter   ? Depression Daughter   ? Anxiety disorder Daughter   ? Lupus Paternal Grandmother   ?  ?Past Surgical History:  ?Procedure Laterality Date  ? ABDOMINAL AORTOGRAM W/LOWER EXTREMITY N/A 04/26/2021  ? Procedure: ABDOMINAL AORTOGRAM W/LOWER EXTREMITY;  Surgeon: Wellington Hampshire, MD;  Location: [facility];  Service: Cardiovascular;  Laterality: N/A;  ? ABDOMINAL AORTOGRAM W/LOWER EXTREMITY Right 04/02/2022  ? Procedure: ABDOMINAL AORTOGRAM W/LOWER EXTREMITY;  Surgeon: Cherre Robins, MD;  Location: [facility];  Service: Vascular;  Laterality: Right;  ? AMPUTATION Right 04/04/2022  ? Procedure: RIGHT BELOW KNEE AMPUTATION;  Surgeon: Newt Minion, MD;  Location: [facility];  Service: Orthopedics;  Laterality: Right;  ? AMPUTATION TOE Bilateral   ? small toe on left foot; all toes on right foot  ? PERIPHERAL VASCULAR BALLOON ANGIOPLASTY Right 04/26/2021  ? Procedure: PERIPHERAL VASCULAR BALLOON ANGIOPLASTY;  Surgeon: Wellington Hampshire, MD;  Location: [facility];  Service: Cardiovascular;  Laterality: Right;  superficial femoral  ? PERIPHERAL VASCULAR INTERVENTION Right 04/26/2021  ? Procedure: PERIPHERAL VASCULAR INTERVENTION;  Surgeon: Wellington Hampshire, MD;  Location: [facility];  Service: Cardiovascular;   Laterality: Right;  Iliac stent  ? PERIPHERAL VASCULAR INTERVENTION Bilateral 04/02/2022  ? Procedure: PERIPHERAL VASCULAR INTERVENTION;  Surgeon: Cherre Robins, MD;  Location: [facility];  Service: Vascular;  Laterality: Bilateral;  common iliac  ? TOTAL VAGINAL HYSTERECTOMY  12/1998  ? ?Social History  ? ?Occupational History  ? Not on file  ?Tobacco Use  ? Smoking status: Smoker, Current Status Unknown  ? Smokeless tobacco: Never  ?Substance and Sexual Activity  ? Alcohol use: Not on file  ? Drug use: Not on file  ? Sexual activity: Not on file  ? ? ? ?

## 2022-04-25 ENCOUNTER — Encounter: Payer: Medicare Other | Admitting: Family

## 2022-05-01 ENCOUNTER — Other Ambulatory Visit: Payer: Self-pay | Admitting: *Deleted

## 2022-05-01 ENCOUNTER — Ambulatory Visit (INDEPENDENT_AMBULATORY_CARE_PROVIDER_SITE_OTHER): Payer: Medicare Other | Admitting: Family

## 2022-05-01 DIAGNOSIS — I70229 Atherosclerosis of native arteries of extremities with rest pain, unspecified extremity: Secondary | ICD-10-CM

## 2022-05-01 DIAGNOSIS — Z89511 Acquired absence of right leg below knee: Secondary | ICD-10-CM

## 2022-05-01 DIAGNOSIS — I739 Peripheral vascular disease, unspecified: Secondary | ICD-10-CM

## 2022-05-01 NOTE — Progress Notes (Signed)
Post-Op Visit Note   Patient: Kerry Randall           Date of Birth: 05/21/1961           MRN: [id number] Visit Date: 05/01/2022 [facility]CP: [facility]  Chief Complaint:  Chief Complaint  Patient presents with   Right Leg - Routine Post Op    HPI:  HPI The patient is a 61 year old woman seen status post right below-knee amputation May 10 she states that she has had 2 falls with direct impact on her limb she was not wearing her limb protector.  She states she has not been having home health physical therapy she has discharged home from skilled nursing  Patient is a new right transtibial  amputee.  Patient's current comorbidities are not expected to impact the ability to function with the prescribed prosthesis. Patient verbally communicates a strong desire to use a prosthesis. Patient currently requires mobility aids to ambulate without a prosthesis.  Expects not to use mobility aids with a new prosthesis.  Patient is a K3 level ambulator that spends a lot of time walking around on uneven terrain over obstacles, up and down stairs, and ambulates with a variable cadence.     Ortho Exam On examination of right residual limb this is well consolidated well-healed staples are in place these were harvested today there is scant serosanguineous drainage no erythema no edema Visit Diagnoses: No diagnosis found.  Plan: Given an order for her prosthesis set up she will follow-up in the office in 3 to 4 weeks continue daily Dial soap cleansing.  Shrinker with direct skin contact.  Follow-Up Instructions: No follow-ups on file.   Imaging: No results found.  Orders:  No orders of the defined types were placed in this encounter.  No orders of the defined types were placed in this encounter.    PMFS History: Patient Active Problem List   Diagnosis Date Noted   Severe protein-calorie malnutrition ([place])    Diabetic infection of right foot ([place]) 03/29/2022   Stage 3a chronic  kidney disease (CKD) ([place]) 03/29/2022   Atrial fibrillation (HCC)    Insulin-requiring or dependent type II diabetes mellitus (HCC)    PAD (peripheral artery disease) (HCC)    Major depressive disorder, recurrent (HCC)    CAD (coronary artery disease)    Gangrene of right foot ([place])    Critical lower limb ischemia ([place]) 04/26/2021   Past Medical History:  Diagnosis Date   Atrial fibrillation (HCC)    Colon polyps    Diabetes mellitus without complication (HCC)    GERD (gastroesophageal reflux disease)    Heart disease, hyperkinetic    Hypercholesteremia    Hypertension    Kidney disease    Leg pain    Major depressive disorder, recurrent ([place])    Palpitations    Vitamin D deficiency     Family History  Problem Relation Age of Onset   Hypertension Mother    Diabetes type II Mother    Heart disease Mother    Throat cancer Father    Hypertension Sister    Depression Sister    Brain cancer Sister    Hypertension Maternal Aunt    Hyperlipidemia Maternal Aunt    Hypertension Maternal Uncle    Hyperlipidemia Maternal Uncle    Hypertension Paternal Aunt    Hyperlipidemia Paternal Aunt    Heart disease Paternal Aunt    Hypertension Paternal Uncle    Hyperlipidemia Paternal Uncle    Diabetes type  II Daughter    Depression Daughter    Anxiety disorder Daughter    Lupus Paternal Grandmother     Past Surgical History:  Procedure Laterality Date   ABDOMINAL AORTOGRAM W/LOWER EXTREMITY N/A 04/26/2021   Procedure: ABDOMINAL AORTOGRAM W/LOWER EXTREMITY;  Surgeon: Wellington Hampshire, MD;  Location: [facility];  Service: Cardiovascular;  Laterality: N/A;   ABDOMINAL AORTOGRAM W/LOWER EXTREMITY Right 04/02/2022   Procedure: ABDOMINAL AORTOGRAM W/LOWER EXTREMITY;  Surgeon: Cherre Robins, MD;  Location: [facility];  Service: Vascular;  Laterality: Right;   AMPUTATION Right 04/04/2022   Procedure: RIGHT BELOW KNEE AMPUTATION;  Surgeon: Newt Minion, MD;  Location: [facility];   Service: Orthopedics;  Laterality: Right;   AMPUTATION TOE Bilateral    small toe on left foot; all toes on right foot   PERIPHERAL VASCULAR BALLOON ANGIOPLASTY Right 04/26/2021   Procedure: PERIPHERAL VASCULAR BALLOON ANGIOPLASTY;  Surgeon: Wellington Hampshire, MD;  Location: [facility];  Service: Cardiovascular;  Laterality: Right;  superficial femoral   PERIPHERAL VASCULAR INTERVENTION Right 04/26/2021   Procedure: PERIPHERAL VASCULAR INTERVENTION;  Surgeon: Wellington Hampshire, MD;  Location: [facility];  Service: Cardiovascular;  Laterality: Right;  Iliac stent   PERIPHERAL VASCULAR INTERVENTION Bilateral 04/02/2022   Procedure: PERIPHERAL VASCULAR INTERVENTION;  Surgeon: Cherre Robins, MD;  Location: [facility];  Service: Vascular;  Laterality: Bilateral;  common iliac   TOTAL VAGINAL HYSTERECTOMY  12/1998   Social History   Occupational History   Not on file  Tobacco Use   Smoking status: Smoker, Current Status Unknown   Smokeless tobacco: Never  Substance and Sexual Activity   Alcohol use: Not on file   Drug use: Not on file   Sexual activity: Not on file

## 2022-05-09 ENCOUNTER — Telehealth: Payer: Self-pay

## 2022-05-09 NOTE — Telephone Encounter (Signed)
Patient called and states that she feel stump is starting to become infected and has some concerns and would like to discuss. Patient is s/p R BKA preformed on 04/04/2022.

## 2022-05-09 NOTE — Telephone Encounter (Signed)
Is there any way that we can work out transportation through [facility] for this pt? I know tha tthey have made cuts but if we could work it out for her tom come in tomorrow it would be great. See message below for reference and let me know if you can help. Thank you so much!!  I called and sw pt and she declined appt for eval with Dr. Lajoyce Corners tomorrow. She states that she has no transportation and does not have any family support and no one that she can ask for a ride to the office she states that she has medical transportation but she needs 5 days in order to set this up. She states that she is having a lot of pain in the limb and that it is very red, draining and "doesn't look good" advised pt that if she has no way that she is able to come into the office then she will have to seek treatment in the ER. Pt states that she will not go now because the bus does not run at this time but that she will go tomorrow.

## 2022-05-10 ENCOUNTER — Ambulatory Visit (INDEPENDENT_AMBULATORY_CARE_PROVIDER_SITE_OTHER): Payer: Medicare Other | Admitting: Orthopedic Surgery

## 2022-05-10 DIAGNOSIS — Z89511 Acquired absence of right leg below knee: Secondary | ICD-10-CM

## 2022-05-10 MED ORDER — DOXYCYCLINE HYCLATE 100 MG PO TABS: 100.0000 mg | ORAL_TABLET | Freq: Two times a day (BID) | ORAL | 0 refills | Status: AC

## 2022-05-13 ENCOUNTER — Encounter: Payer: Self-pay | Admitting: Orthopedic Surgery

## 2022-05-13 NOTE — Progress Notes (Signed)
Office Visit Note   Patient: Kerry Randall           Date of Birth: [birth year]           MRN: [id number] Visit Date: 05/10/2022              Requested by: [facility] [address] [place],  [place] [address] PCP: [facility]  Chief Complaint  Patient presents with   Right Leg - Routine Post Op    04/04/22 right BKA with Kerecis       HPI: Patient is a 61 year old woman who presents in follow-up status post right below-knee amputation on May 10 with Kerecis tissue graft support.  Patient states she has been having increased pain redness and swelling.  Patient has had multiple falls directly on her leg.  Assessment & Plan: Visit Diagnoses:  1. History of right below knee amputation (HCC)     Plan: Prescription is provided for doxycycline continue Dial soap cleansing dry dressing and the shrinker.  Recommended she stop applying creams to the incision.  Follow-Up Instructions: No follow-ups on file.   Ortho Exam  Patient is alert, oriented, no adenopathy, well-dressed, normal affect, normal respiratory effort. Examination patient has maceration along the incision with drainage weeping edema dermatitis there is no cellulitis.  There are 2 open wounds 1 is 2 cm in diameter the other is 1 cm in diameter.  Imaging: No results found. No images are attached to the encounter.  Labs: Lab Results  Component Value Date   HGBA1C 7.0 (H) 03/30/2022   ESRSEDRATE 94 (H) 03/31/2022   ESRSEDRATE 85 (H) 03/30/2022   ESRSEDRATE 72 (H) 03/29/2022   CRP 16.2 (H) 03/31/2022   CRP 18.0 (H) 03/30/2022   CRP 17.7 (H) 03/29/2022   REPTSTATUS 04/03/2022 FINAL 03/29/2022   CULT  03/29/2022    NO GROWTH 5 DAYS Performed at [facility], [address] [place], [place] [address]      Lab Results  Component Value Date   ALBUMIN 2.2 (L) 04/06/2022   ALBUMIN 2.2 (L) 04/05/2022   ALBUMIN 2.1 (L) 04/04/2022   PREALBUMIN 13.5 (L) 03/29/2022    Lab Results   Component Value Date   MG 1.7 04/06/2022   MG 1.8 04/05/2022   MG 1.6 (L) 04/04/2022   No results found for: "VD25OH"  Lab Results  Component Value Date   PREALBUMIN 13.5 (L) 03/29/2022      Latest Ref Rng & Units 04/06/2022    1:58 AM 04/05/2022    1:29 AM 04/04/2022    2:44 AM  CBC EXTENDED  WBC 4.0 - 10.5 K/uL 8.6  9.4  9.7   RBC 3.87 - 5.11 MIL/uL 3.39  3.50  3.33   Hemoglobin 12.0 - 15.0 g/dL 9.9  63.8  9.6   HCT 75.6 - 46.0 % 30.7  32.0  30.0   Platelets 150 - 400 K/uL 168  201  172   NEUT# 1.7 - 7.7 K/uL  6.2  5.8   Lymph# 0.7 - 4.0 K/uL  2.0  2.7      There is no height or weight on file to calculate BMI.  Orders:  No orders of the defined types were placed in this encounter.  Meds ordered this encounter  Medications   doxycycline (VIBRA-TABS) 100 MG tablet    Sig: Take 1 tablet (100 mg total) by mouth 2 (two) times daily.    Dispense:  60 tablet  Refill:  0     Procedures: No procedures performed  Clinical Data: No additional findings.  ROS:  All other systems negative, except as noted in the HPI. Review of Systems  Objective: Vital Signs: There were no vitals taken for this visit.  Specialty Comments:  No specialty comments available.  PMFS History: Patient Active Problem List   Diagnosis Date Noted   Severe protein-calorie malnutrition (HCC)    Diabetic infection of right foot (HCC) 03/29/2022   Stage 3a chronic kidney disease (CKD) (HCC) 03/29/2022   Atrial fibrillation (HCC)    Insulin-requiring or dependent type II diabetes mellitus (HCC)    PAD (peripheral artery disease) (HCC)    Major depressive disorder, recurrent (HCC)    CAD (coronary artery disease)    Gangrene of right foot (HCC)    Critical lower limb ischemia (HCC) 04/26/2021   Past Medical History:  Diagnosis Date   Atrial fibrillation (HCC)    Colon polyps    Diabetes mellitus without complication (HCC)    GERD (gastroesophageal reflux disease)    Heart disease,  hyperkinetic    Hypercholesteremia    Hypertension    Kidney disease    Leg pain    Major depressive disorder, recurrent (HCC)    Palpitations    Vitamin D deficiency     Family History  Problem Relation Age of Onset   Hypertension Mother    Diabetes type II Mother    Heart disease Mother    Throat cancer Father    Hypertension Sister    Depression Sister    Brain cancer Sister    Hypertension Maternal Aunt    Hyperlipidemia Maternal Aunt    Hypertension Maternal Uncle    Hyperlipidemia Maternal Uncle    Hypertension Paternal Aunt    Hyperlipidemia Paternal Aunt    Heart disease Paternal Aunt    Hypertension Paternal Uncle    Hyperlipidemia Paternal Uncle    Diabetes type II Daughter    Depression Daughter    Anxiety disorder Daughter    Lupus Paternal Grandmother     Past Surgical History:  Procedure Laterality Date   ABDOMINAL AORTOGRAM W/LOWER EXTREMITY N/A 04/26/2021   Procedure: ABDOMINAL AORTOGRAM W/LOWER EXTREMITY;  Surgeon: Iran Ouch, MD;  [facility];  Service: Cardiovascular;  Laterality: N/A;   ABDOMINAL AORTOGRAM W/LOWER EXTREMITY Right 04/02/2022   Procedure: ABDOMINAL AORTOGRAM W/LOWER EXTREMITY;  Surgeon: Leonie Douglas, MD;  [facility];  Service: Vascular;  Laterality: Right;   AMPUTATION Right 04/04/2022   Procedure: RIGHT BELOW KNEE AMPUTATION;  Surgeon: Nadara Mustard, MD;  Location: [facility];  Service: Orthopedics;  Laterality: Right;   AMPUTATION TOE Bilateral    small toe on left foot; all toes on right foot   PERIPHERAL VASCULAR BALLOON ANGIOPLASTY Right 04/26/2021   Procedure: PERIPHERAL VASCULAR BALLOON ANGIOPLASTY;  Surgeon: Iran Ouch, MD;  [facility];  Service: Cardiovascular;  Laterality: Right;  superficial femoral   PERIPHERAL VASCULAR INTERVENTION Right 04/26/2021   Procedure: PERIPHERAL VASCULAR INTERVENTION;  Surgeon: Iran Ouch, MD;  [facility];  Service:  Cardiovascular;  Laterality: Right;  Iliac stent   PERIPHERAL VASCULAR INTERVENTION Bilateral 04/02/2022   Procedure: PERIPHERAL VASCULAR INTERVENTION;  Surgeon: Leonie Douglas, MD;  [facility];  Service: Vascular;  Laterality: Bilateral;  common iliac   TOTAL VAGINAL HYSTERECTOMY  12/1998   Social History   Occupational History   Not on  file  Tobacco Use   Smoking status: Smoker, Current Status Unknown   Smokeless tobacco: Never  Substance and Sexual Activity   Alcohol use: Not on file   Drug use: Not on file   Sexual activity: Not on file

## 2022-05-15 ENCOUNTER — Encounter: Payer: Medicare Other | Admitting: Vascular Surgery

## 2022-05-15 ENCOUNTER — Telehealth: Payer: Self-pay | Admitting: Orthopedic Surgery

## 2022-05-15 ENCOUNTER — Inpatient Hospital Stay (HOSPITAL_COMMUNITY): Admission: RE | Admit: 2022-05-15 | Payer: Medicare Other | Source: Ambulatory Visit

## 2022-05-15 NOTE — Telephone Encounter (Signed)
Pt called wondering if she was suppose to have antibiotics called in and she confirmed pham in file

## 2022-05-15 NOTE — Telephone Encounter (Signed)
Called [facility], they confirmed doxycycline sent on 05/10/22 is ready for pick up. I called pt on number on file and no answer and no VM set up.

## 2022-05-15 NOTE — Telephone Encounter (Signed)
Pt informed

## 2022-05-17 ENCOUNTER — Ambulatory Visit (INDEPENDENT_AMBULATORY_CARE_PROVIDER_SITE_OTHER): Payer: Medicare Other | Admitting: Orthopedic Surgery

## 2022-05-17 DIAGNOSIS — Z89511 Acquired absence of right leg below knee: Secondary | ICD-10-CM

## 2022-05-21 ENCOUNTER — Encounter: Payer: Self-pay | Admitting: Orthopedic Surgery

## 2022-05-22 ENCOUNTER — Encounter: Payer: Medicare Other | Admitting: Family

## 2022-05-24 ENCOUNTER — Encounter: Payer: Medicare Other | Admitting: Orthopedic Surgery

## 2022-05-28 ENCOUNTER — Ambulatory Visit (INDEPENDENT_AMBULATORY_CARE_PROVIDER_SITE_OTHER): Payer: Medicare Other | Admitting: Orthopedic Surgery

## 2022-05-28 DIAGNOSIS — Z89511 Acquired absence of right leg below knee: Secondary | ICD-10-CM

## 2022-05-30 ENCOUNTER — Encounter: Payer: Self-pay | Admitting: Orthopedic Surgery

## 2022-05-30 NOTE — Progress Notes (Signed)
Office Visit Note   Patient: Kerry Randall           Date of Birth: [birth year]           MRN: [id number] Visit Date: 05/28/2022              Requested by: [facility] [address] [place],  [place] [address] PCP: [facility]  Chief Complaint  Patient presents with   Right Leg - Routine Post Op    04/04/22 right BKA w/ kerecis      HPI: Patient is a 61 year old woman who is status post right below the knee amputation approximately 2 months ago.  Patient states that she fell sustaining a direct impact to the residual limb.  Assessment & Plan: Visit Diagnoses:  1. History of right below knee amputation (HCC)     Plan: Recommend that she get a smaller stump shrinker.  She has a follow-up appointment with [facility] for prosthetic fitting.  Follow-Up Instructions: Return in about 4 weeks (around 06/25/2022).   Ortho Exam  Patient is alert, oriented, no adenopathy, well-dressed, normal affect, normal respiratory effort. Examination there is a little swelling and well approximated wound edges.  There are a few superficial wounds with fibrinous tissue but no cellulitis no odor or drainage.  Imaging: No results found.   Labs: Lab Results  Component Value Date   HGBA1C 7.0 (H) 03/30/2022   ESRSEDRATE 94 (H) 03/31/2022   ESRSEDRATE 85 (H) 03/30/2022   ESRSEDRATE 72 (H) 03/29/2022   CRP 16.2 (H) 03/31/2022   CRP 18.0 (H) 03/30/2022   CRP 17.7 (H) 03/29/2022   REPTSTATUS 04/03/2022 FINAL 03/29/2022   CULT  03/29/2022    NO GROWTH 5 DAYS Performed at [facility], [address] [place], [place] [address]      Lab Results  Component Value Date   ALBUMIN 2.2 (L) 04/06/2022   ALBUMIN 2.2 (L) 04/05/2022   ALBUMIN 2.1 (L) 04/04/2022   PREALBUMIN 13.5 (L) 03/29/2022    Lab Results  Component Value Date   MG 1.7 04/06/2022   MG 1.8 04/05/2022   MG 1.6 (L) 04/04/2022   No results found for: "VD25OH"  Lab Results  Component Value Date    PREALBUMIN 13.5 (L) 03/29/2022      Latest Ref Rng & Units 04/06/2022    1:58 AM 04/05/2022    1:29 AM 04/04/2022    2:44 AM  CBC EXTENDED  WBC 4.0 - 10.5 K/uL 8.6  9.4  9.7   RBC 3.87 - 5.11 MIL/uL 3.39  3.50  3.33   Hemoglobin 12.0 - 15.0 g/dL 9.9  84.6  9.6   HCT 96.2 - 46.0 % 30.7  32.0  30.0   Platelets 150 - 400 K/uL 168  201  172   NEUT# 1.7 - 7.7 K/uL  6.2  5.8   Lymph# 0.7 - 4.0 K/uL  2.0  2.7      There is no height or weight on file to calculate BMI.  Orders:  No orders of the defined types were placed in this encounter.  No orders of the defined types were placed in this encounter.    Procedures: No procedures performed  Clinical Data: No additional findings.  ROS:  All other systems negative, except as noted in the HPI. Review of Systems  Objective: Vital Signs: There were no vitals taken for this visit.  Specialty Comments:  No specialty comments available.  PMFS History: Patient Active  Problem List   Diagnosis Date Noted   Severe protein-calorie malnutrition (HCC)    Diabetic infection of right foot (HCC) 03/29/2022   Stage 3a chronic kidney disease (CKD) (HCC) 03/29/2022   Atrial fibrillation (HCC)    Insulin-requiring or dependent type II diabetes mellitus (HCC)    PAD (peripheral artery disease) (HCC)    Major depressive disorder, recurrent (HCC)    CAD (coronary artery disease)    Gangrene of right foot (HCC)    Critical lower limb ischemia (HCC) 04/26/2021   Past Medical History:  Diagnosis Date   Atrial fibrillation (HCC)    Colon polyps    Diabetes mellitus without complication (HCC)    GERD (gastroesophageal reflux disease)    Heart disease, hyperkinetic    Hypercholesteremia    Hypertension    Kidney disease    Leg pain    Major depressive disorder, recurrent (HCC)    Palpitations    Vitamin D deficiency     Family History  Problem Relation Age of Onset   Hypertension Mother    Diabetes type II Mother    Heart disease  Mother    Throat cancer Father    Hypertension Sister    Depression Sister    Brain cancer Sister    Hypertension Maternal Aunt    Hyperlipidemia Maternal Aunt    Hypertension Maternal Uncle    Hyperlipidemia Maternal Uncle    Hypertension Paternal Aunt    Hyperlipidemia Paternal Aunt    Heart disease Paternal Aunt    Hypertension Paternal Uncle    Hyperlipidemia Paternal Uncle    Diabetes type II Daughter    Depression Daughter    Anxiety disorder Daughter    Lupus Paternal Grandmother     Past Surgical History:  Procedure Laterality Date   ABDOMINAL AORTOGRAM W/LOWER EXTREMITY N/A 04/26/2021   Procedure: ABDOMINAL AORTOGRAM W/LOWER EXTREMITY;  Surgeon: Iran Ouch, MD;  [facility];  Service: Cardiovascular;  Laterality: N/A;   ABDOMINAL AORTOGRAM W/LOWER EXTREMITY Right 04/02/2022   Procedure: ABDOMINAL AORTOGRAM W/LOWER EXTREMITY;  Surgeon: Leonie Douglas, MD;  [facility];  Service: Vascular;  Laterality: Right;   AMPUTATION Right 04/04/2022   Procedure: RIGHT BELOW KNEE AMPUTATION;  Surgeon: Nadara Mustard, MD;  Location: [facility] OR;  Service: Orthopedics;  Laterality: Right;   AMPUTATION TOE Bilateral    small toe on left foot; all toes on right foot   PERIPHERAL VASCULAR BALLOON ANGIOPLASTY Right 04/26/2021   Procedure: PERIPHERAL VASCULAR BALLOON ANGIOPLASTY;  Surgeon: Iran Ouch, MD;  [facility];  Service: Cardiovascular;  Laterality: Right;  superficial femoral   PERIPHERAL VASCULAR INTERVENTION Right 04/26/2021   Procedure: PERIPHERAL VASCULAR INTERVENTION;  Surgeon: Iran Ouch, MD;  [facility];  Service: Cardiovascular;  Laterality: Right;  Iliac stent   PERIPHERAL VASCULAR INTERVENTION Bilateral 04/02/2022   Procedure: PERIPHERAL VASCULAR INTERVENTION;  Surgeon: Leonie Douglas, MD;  [facility];  Service: Vascular;  Laterality: Bilateral;  common iliac   TOTAL VAGINAL  HYSTERECTOMY  12/1998   Social History   Occupational History   Not on file  Tobacco Use   Smoking status: Smoker, Current Status Unknown   Smokeless tobacco: Never  Substance and Sexual Activity   Alcohol use: Not on file   Drug use: Not on file   Sexual activity: Not on file

## 2022-06-25 ENCOUNTER — Ambulatory Visit (INDEPENDENT_AMBULATORY_CARE_PROVIDER_SITE_OTHER): Payer: Medicare Other | Admitting: Orthopedic Surgery

## 2022-06-25 ENCOUNTER — Encounter: Payer: Self-pay | Admitting: Orthopedic Surgery

## 2022-06-25 DIAGNOSIS — Z89511 Acquired absence of right leg below knee: Secondary | ICD-10-CM

## 2022-06-25 NOTE — Progress Notes (Signed)
Office Visit Note   Patient: Kerry Randall           Date of Birth: 1961-09-17           MRN: [id number] Visit Date: 06/25/2022              Requested by: Center, [facility] [address] [place],  [place] [address] PCP: Center, [facility]  Chief Complaint  Patient presents with   Right Leg - Routine Post Op    04/04/22 right BKA with kerecis placement      HPI: Patient is a 61 year old woman who presents 2-1/2 months status post right below-knee amputation with Kerecis tissue graft.  Assessment & Plan: Visit Diagnoses:  1. History of right below knee amputation (HCC)     Plan: We will set patient up for physical therapy for gait training.  Continue with her stump shrinker.  Follow-up with [facility] for prosthetic fitting this month.  Follow-Up Instructions: Return in about 2 months (around 08/25/2022).   Ortho Exam  Patient is alert, oriented, no adenopathy, well-dressed, normal affect, normal respiratory effort. Examination the right residual limb is well consolidated there were a few areas of scab that are resolving well.  No redness no cellulitis no swelling no signs of infection.  Patient is a new right transtibial  amputee.  Patient's current comorbidities are not expected to impact the ability to function with the prescribed prosthesis. Patient verbally communicates a strong desire to use a prosthesis. Patient currently requires mobility aids to ambulate without a prosthesis.  Expects not to use mobility aids with a new prosthesis.  Patient is a K3 level ambulator that spends a lot of time walking around on uneven terrain over obstacles, up and down stairs, and ambulates with a variable cadence.     Imaging: No results found.   Labs: Lab Results  Component Value Date   HGBA1C 7.0 (H) 03/30/2022   ESRSEDRATE 94 (H) 03/31/2022   ESRSEDRATE 85 (H) 03/30/2022   ESRSEDRATE 72 (H) 03/29/2022   CRP 16.2 (H) 03/31/2022   CRP 18.0 (H) 03/30/2022   CRP  17.7 (H) 03/29/2022   REPTSTATUS 04/03/2022 FINAL 03/29/2022   CULT  03/29/2022    NO GROWTH 5 DAYS Performed at [facility], [address] [place], [place] [address]      Lab Results  Component Value Date   ALBUMIN 2.2 (L) 04/06/2022   ALBUMIN 2.2 (L) 04/05/2022   ALBUMIN 2.1 (L) 04/04/2022   PREALBUMIN 13.5 (L) 03/29/2022    Lab Results  Component Value Date   MG 1.7 04/06/2022   MG 1.8 04/05/2022   MG 1.6 (L) 04/04/2022   No results found for: "VD25OH"  Lab Results  Component Value Date   PREALBUMIN 13.5 (L) 03/29/2022      Latest Ref Rng & Units 04/06/2022    1:58 AM 04/05/2022    1:29 AM 04/04/2022    2:44 AM  CBC EXTENDED  WBC 4.0 - 10.5 K/uL 8.6  9.4  9.7   RBC 3.87 - 5.11 MIL/uL 3.39  3.50  3.33   Hemoglobin 12.0 - 15.0 g/dL 9.9  80.9  9.6   HCT 98.3 - 46.0 % 30.7  32.0  30.0   Platelets 150 - 400 K/uL 168  201  172   NEUT# 1.7 - 7.7 K/uL  6.2  5.8   Lymph# 0.7 - 4.0 K/uL  2.0  2.7      There is no height or  weight on file to calculate BMI.  Orders:  No orders of the defined types were placed in this encounter.  No orders of the defined types were placed in this encounter.    Procedures: No procedures performed  Clinical Data: No additional findings.  ROS:  All other systems negative, except as noted in the HPI. Review of Systems  Objective: Vital Signs: There were no vitals taken for this visit.  Specialty Comments:  No specialty comments available.  PMFS History: Patient Active Problem List   Diagnosis Date Noted   Severe protein-calorie malnutrition ([place])    Diabetic infection of right foot ([place]) 03/29/2022   Stage 3a chronic kidney disease (CKD) ([place]) 03/29/2022   Atrial fibrillation ([place])    Insulin-requiring or dependent type II diabetes mellitus ([place])    PAD (peripheral artery disease) (HCC)    Major depressive disorder, recurrent (HCC)    CAD (coronary artery disease)    Gangrene of right foot ([place])    Critical  lower limb ischemia ([place]) 04/26/2021   Past Medical History:  Diagnosis Date   Atrial fibrillation (HCC)    Colon polyps    Diabetes mellitus without complication (HCC)    GERD (gastroesophageal reflux disease)    Heart disease, hyperkinetic    Hypercholesteremia    Hypertension    Kidney disease    Leg pain    Major depressive disorder, recurrent ([place])    Palpitations    Vitamin D deficiency     Family History  Problem Relation Age of Onset   Hypertension Mother    Diabetes type II Mother    Heart disease Mother    Throat cancer Father    Hypertension Sister    Depression Sister    Brain cancer Sister    Hypertension Maternal Aunt    Hyperlipidemia Maternal Aunt    Hypertension Maternal Uncle    Hyperlipidemia Maternal Uncle    Hypertension Paternal Aunt    Hyperlipidemia Paternal Aunt    Heart disease Paternal Aunt    Hypertension Paternal Uncle    Hyperlipidemia Paternal Uncle    Diabetes type II Daughter    Depression Daughter    Anxiety disorder Daughter    Lupus Paternal Grandmother     Past Surgical History:  Procedure Laterality Date   ABDOMINAL AORTOGRAM W/LOWER EXTREMITY N/A 04/26/2021   Procedure: ABDOMINAL AORTOGRAM W/LOWER EXTREMITY;  Surgeon: Wellington Hampshire, MD;  Location: [place] [facility];  Service: Cardiovascular;  Laterality: N/A;   ABDOMINAL AORTOGRAM W/LOWER EXTREMITY Right 04/02/2022   Procedure: ABDOMINAL AORTOGRAM W/LOWER EXTREMITY;  Surgeon: Cherre Robins, MD;  Location: [place] [facility];  Service: Vascular;  Laterality: Right;   AMPUTATION Right 04/04/2022   Procedure: RIGHT BELOW KNEE AMPUTATION;  Surgeon: Newt Minion, MD;  Location: [place];  Service: Orthopedics;  Laterality: Right;   AMPUTATION TOE Bilateral    small toe on left foot; all toes on right foot   PERIPHERAL VASCULAR BALLOON ANGIOPLASTY Right 04/26/2021   Procedure: PERIPHERAL VASCULAR BALLOON ANGIOPLASTY;  Surgeon: Wellington Hampshire, MD;  Location: [place] [facility];   Service: Cardiovascular;  Laterality: Right;  superficial femoral   PERIPHERAL VASCULAR INTERVENTION Right 04/26/2021   Procedure: PERIPHERAL VASCULAR INTERVENTION;  Surgeon: Wellington Hampshire, MD;  Location: [place] [facility];  Service: Cardiovascular;  Laterality: Right;  Iliac stent   PERIPHERAL VASCULAR INTERVENTION Bilateral 04/02/2022   Procedure: PERIPHERAL VASCULAR INTERVENTION;  Surgeon: Cherre Robins, MD;  Location: [place] [facility];  Service: Vascular;  Laterality: Bilateral;  common iliac   TOTAL VAGINAL HYSTERECTOMY  12/1998   Social History   Occupational History   Not on file  Tobacco Use   Smoking status: Smoker, Current Status Unknown   Smokeless tobacco: Never  Substance and Sexual Activity   Alcohol use: Not on file   Drug use: Not on file   Sexual activity: Not on file

## 2022-08-02 ENCOUNTER — Other Ambulatory Visit: Payer: Self-pay | Admitting: Cardiovascular Disease

## 2022-08-02 NOTE — Telephone Encounter (Signed)
Refill request

## 2022-08-06 ENCOUNTER — Encounter (HOSPITAL_COMMUNITY): Payer: Self-pay | Admitting: Physical Therapy

## 2022-08-06 ENCOUNTER — Ambulatory Visit (HOSPITAL_COMMUNITY): Payer: Medicare Other | Attending: Orthopedic Surgery | Admitting: Physical Therapy

## 2022-08-06 DIAGNOSIS — M6281 Muscle weakness (generalized): Secondary | ICD-10-CM | POA: Insufficient documentation

## 2022-08-06 DIAGNOSIS — R262 Difficulty in walking, not elsewhere classified: Secondary | ICD-10-CM | POA: Insufficient documentation

## 2022-08-06 DIAGNOSIS — Z89511 Acquired absence of right leg below knee: Secondary | ICD-10-CM | POA: Diagnosis present

## 2022-08-06 DIAGNOSIS — R2689 Other abnormalities of gait and mobility: Secondary | ICD-10-CM | POA: Diagnosis present

## 2022-08-06 NOTE — Therapy (Signed)
OUTPATIENT PHYSICAL THERAPY PROSTHETICS EVALUATION   Patient Name: Kerry Randall MRN: [id number] DOB:1961/10/14, 61 y.o., female Today's Date: 08/06/2022  PCP: Dr. Nancy Marus - [facility] REFERRING PROVIDER: Aldean Baker, MD   PT End of Session - 08/06/22 0843     Visit Number 1    Number of Visits 16    Date for PT Re-Evaluation 10/01/22    Authorization Type UHC Medicare ([place] Secondary)    Authorization Time Period No Auth, No visit limit    Progress Note Due on Visit 10    PT Start Time [id number]    PT Stop Time 1027    PT Time Calculation (min) 41 min    Equipment Utilized During Treatment Gait belt    Activity Tolerance Patient tolerated treatment well    Behavior During Therapy WFL for tasks assessed/performed             Past Medical History:  Diagnosis Date   Atrial fibrillation (HCC)    Colon polyps    Diabetes mellitus without complication (HCC)    GERD (gastroesophageal reflux disease)    Heart disease, hyperkinetic    Hypercholesteremia    Hypertension    Kidney disease    Leg pain    Major depressive disorder, recurrent (HCC)    Palpitations    Vitamin D deficiency    Past Surgical History:  Procedure Laterality Date   ABDOMINAL AORTOGRAM W/LOWER EXTREMITY N/A 04/26/2021   Procedure: ABDOMINAL AORTOGRAM W/LOWER EXTREMITY;  Surgeon: Iran Ouch, MD;  [facility];  Service: Cardiovascular;  Laterality: N/A;   ABDOMINAL AORTOGRAM W/LOWER EXTREMITY Right 04/02/2022   Procedure: ABDOMINAL AORTOGRAM W/LOWER EXTREMITY;  Surgeon: Leonie Douglas, MD;  Location: [facility];  Service: Vascular;  Laterality: Right;   AMPUTATION Right 04/04/2022   Procedure: RIGHT BELOW KNEE AMPUTATION;  Surgeon: Nadara Mustard, MD;  Location: [facility];  Service: Orthopedics;  Laterality: Right;   AMPUTATION TOE Bilateral    small toe on left foot; all toes on right foot   PERIPHERAL VASCULAR BALLOON ANGIOPLASTY Right 04/26/2021   Procedure:  PERIPHERAL VASCULAR BALLOON ANGIOPLASTY;  Surgeon: Iran Ouch, MD;  [facility];  Service: Cardiovascular;  Laterality: Right;  superficial femoral   PERIPHERAL VASCULAR INTERVENTION Right 04/26/2021   Procedure: PERIPHERAL VASCULAR INTERVENTION;  Surgeon: Iran Ouch, MD;  [facility];  Service: Cardiovascular;  Laterality: Right;  Iliac stent   PERIPHERAL VASCULAR INTERVENTION Bilateral 04/02/2022   Procedure: PERIPHERAL VASCULAR INTERVENTION;  Surgeon: Leonie Douglas, MD;  [facility];  Service: Vascular;  Laterality: Bilateral;  common iliac   TOTAL VAGINAL HYSTERECTOMY  12/1998   Patient Active Problem List   Diagnosis Date Noted   Severe protein-calorie malnutrition (HCC)    Diabetic infection of right foot (HCC) 03/29/2022   Stage 3a chronic kidney disease (CKD) (HCC) 03/29/2022   Atrial fibrillation (HCC)    Insulin-requiring or dependent type II diabetes mellitus (HCC)    PAD (peripheral artery disease) (HCC)    Major depressive disorder, recurrent (HCC)    CAD (coronary artery disease)    Gangrene of right foot (HCC)    Critical lower limb ischemia (HCC) 04/26/2021    ONSET DATE: 04/04/22 Rt BKA  REFERRING DIAG: Z89.511 (ICD-10-CM) - History of right below knee amputation   THERAPY DIAG:  Difficulty in walking, not elsewhere classified  Muscle weakness (generalized)  Other abnormalities of gait and mobility  Hx of right BKA ([place])  Rationale for Evaluation and Treatment Rehabilitation  SUBJECTIVE:   SUBJECTIVE STATEMENT: Pt reports she developed a LE infection and underwent Rt BKA on 04/04/22. After hospitalization, she underwent rehab at a SNF for about 2 weeks. No HHPT. Reports she has not been exercising. Received RLE prosthesis last Monday from [facility]. She reports she knows very little about the prosthesis, but was able to walk in // bars a few times while having it fitted. She has been instructed  to wear 2 hours each day AM/PM and has been working up to this. It has been sore adjusting to wearing this. Follows-up with [facility] Oct. 4th.  Pt accompanied by: self Has been receiving transportation services.    PAIN:  Are you having pain? Yes: NPRS scale: 3/10 Pain location: Rt medial knee Pain description: sore Aggravating factors: walking, wearing prosthesis Relieving factors: rest   PRECAUTIONS: None  WEIGHT BEARING RESTRICTIONS No  FALLS: Has patient fallen in last 6 months? Yes. Number of falls 6  LIVING ENVIRONMENT: Lives with: lives alone Lives in: House/apartment Home Access: Level entry Home layout: One level Stairs: No (there is one ramp to get to apartment - 73ft) Has following equipment at home: Single point cane, Walker - 2 wheeled, and Wheelchair (manual)  PLOF: Independent  PATIENT GOALS   Patient would like to be fully independent, walking again. Pick up grandchildren, shop, participate in family and community activities without the wheelchair.  OBJECTIVE:    COGNITION: Overall cognitive status: Within functional limits for tasks assessed   SENSATION: [facility] She does report phantom pains. MUSCLE LENGTH: Hamstrings: Right 35 deg from full ext;  Thomas test: Right 17 deg past extension;   LOWER EXTREMITY ROM:     Active  Right eval Left eval  Hip flexion    Hip extension    Hip abduction    Hip adduction    Hip internal rotation    Hip external rotation    Knee flexion 107 122  Knee extension -14 0  Ankle dorsiflexion    Ankle plantarflexion    Ankle inversion    Ankle eversion     (Blank rows = not tested)    LOWER EXTREMITY MMT:  MMT Right eval Left eval  Hip flexion 4+ 4+  Hip extension 4 4+  Hip abduction 4 5  Hip adduction 4+ 4+  Hip internal rotation    Hip external rotation    Knee flexion 5 5  Knee extension 4+ 5  Ankle dorsiflexion    Ankle plantarflexion    Ankle inversion    Ankle eversion    (Blank rows =  not tested)    TRANSFERS: Squat pivot transfer: Complete Independence (without AD)  RAMP Complete Independence    GAIT: Gait pattern: step to pattern, step through pattern, decreased stance time- Right, decreased stride length, Right steppage, Right foot flat, knee flexed in stance- Right, antalgic, and decreased trunk rotation Distance walked: 20' Assistive device utilized: Environmental consultant - 2 wheeled Level of assistance: CGA Comments: Demonstrates good alignment with prosthesis; Rt knee is flexed throughout gait cycle with some mild buckling noted and antalgic patterning.  FUNCTIONAL TESTs:  30 seconds chair stand test 10 times in 30 seconds, reliant on arm rests and Lt foot offset Timed up and go (TUG): 31.5 with RW    CURRENT PROSTHETIC WEAR ASSESSMENT: Patient is independent with: residual limb care, care of non-amputated limb, prosthetic cleaning, and proper wear schedule/adjustment Patient is dependent  with: correct ply sock adjustment Donning prosthesis: Complete Independence Doffing prosthesis: Modified independence Prosthetic wear tolerance: 2 hours/day, 7 days/week Prosthetic weight bearing tolerance: Not tested Edema: not tested Residual limb condition: scars healed, 2 areas of skin abrasion without overt redness distal residual tiba, and proximal to knee approx 10 cm 2 areas    PATIENT SURVEYS:  LEFS 24/80 = 30%    TODAY'S TREATMENT:   TREATMENT:  PATIENT EDUCATED ON FOLLOWING PROSTHETIC CARE: Prosthetic wear tolerance: 2 hours/ 2x/day, 7 days/week Prosthetic weight bearing tolerance: Reassess during future visits Other education  Skin check, Residual limb care, Correct ply sock adjustment, Proper doffing, and Proper wear schedule/adjustment Low load long duration stretching of Rt knee to increase extension.   PATIENT EDUCATION: Education details: Findings, POC, knee extension ROM, skin checks, ply socks (use not needed yet.) Person educated:  Patient Education method: Explanation Education comprehension: verbalized understanding   HOME EXERCISE PROGRAM:   ASSESSMENT:  CLINICAL IMPRESSION: Patient is a 61 y.o. female who was seen today for physical therapy evaluation and treatment for her Rt BKA which occurred surgically on 04/04/22. Patient presents with deficits including Rt BKA, reduced strength, limited range of motion, decreased endurance, limited activity tolerance, abnormalities of gait, impaired balance, decreased knowledge of AD use, and pain, contributing to impaired functional mobility with ADLs and IADLs. Patient is currently restricted in ADLs as indicated by objective and subjective functional outcome measures, as well as reported history and objective measures taken during this exam. Patient will benefit from skilled physical therapy intervention in order to improve function and reduce the impairments listed above.    OBJECTIVE IMPAIRMENTS Abnormal gait, decreased activity tolerance, decreased balance, decreased endurance, decreased knowledge of condition, decreased knowledge of use of DME, decreased mobility, difficulty walking, decreased ROM, decreased strength, impaired flexibility, improper body mechanics, and prosthetic dependency .   ACTIVITY LIMITATIONS carrying, lifting, bending, standing, squatting, stairs, transfers, and locomotion level  PARTICIPATION LIMITATIONS: driving, shopping, and community activity  PERSONAL FACTORS Time since onset of injury/illness/exacerbation and Transportation are also affecting patient's functional outcome.   REHAB POTENTIAL: Excellent  CLINICAL DECISION MAKING: Stable/uncomplicated  EVALUATION COMPLEXITY: Low   GOALS: Goals reviewed with patient? Yes  SHORT TERM GOALS: Target date: 09/03/22  Patient will be independent with initial HEP and self-management strategies to improve functional outcomes Baseline: Initiated Goal status: INITIAL    2. Patient will improve  30 second chair stand test to 12 stands in 30 seconds to show significant improvement in functional strength and transfers Baseline: 10x in 30 seconds Goal status: INITIAL     LONG TERM GOALS: Target date: 10/01/22  Patient will be independent with advanced HEP and self-management strategies to improve functional outcomes Baseline: n/a Goal status: INITIAL  2.  Patient will improve LEFS score by 9 points to indicate improvement in functional outcomes Baseline: 24/80 Goal status: INITIAL  3.  Patient will have X knee AROM 0-120 degrees to improve functional mobility and facilitate squatting to pick up items from floor. Baseline: 0-14-107 Goal status: INITIAL  4. Patient will have equal to or > 4+/5 MMT throughout 5/5 to improve ability to perform functional mobility, stair ambulation and ADLs.  Baseline: see above Goal status: INITIAL  5. Patient will be able to ambulate =/>5 minutes with LRAD to increase participation in family and community activities out of the house. Baseline: Assess next visit Goal status: INITIAL   6. Patient will improve TUG to <20 seconds with LRAD and prosthetic to show significant improvement  and independence with transfers and ambulation. Baseline: 31.5 seconds with RW Goal status: INITIAL   PLAN: PT FREQUENCY: 2x/week  PT DURATION: 8 weeks  PLANNED INTERVENTIONS: Therapeutic exercises, Therapeutic activity, Neuromuscular re-education, Balance training, Gait training, Patient/Family education, Self Care, Joint mobilization, Stair training, Orthotic/Fit training, Prosthetic training, DME instructions, Dry Needling, Electrical stimulation, Wheelchair mobility training, Cryotherapy, Moist heat, scar mobilization, Taping, Ultrasound, Biofeedback, Ionotophoresis 4mg /ml Dexamethasone, Manual therapy, and Re-evaluation  PLAN FOR NEXT SESSION: Prosthetic education, gait training parallel bars>RW>cane as able to progress. LE strength and ROM (Rt knee with fair  contracture.)   , PT 08/06/2022, 10:49 AM 10/06/2022, PT, DPT Physical Therapist Acute Rehabilitation Services [facility] Outpatient Rehabilitation Services [facility]

## 2022-08-09 ENCOUNTER — Ambulatory Visit (HOSPITAL_COMMUNITY): Payer: Medicare Other | Admitting: Physical Therapy

## 2022-08-09 DIAGNOSIS — R262 Difficulty in walking, not elsewhere classified: Secondary | ICD-10-CM

## 2022-08-09 DIAGNOSIS — Z89511 Acquired absence of right leg below knee: Secondary | ICD-10-CM

## 2022-08-09 DIAGNOSIS — M6281 Muscle weakness (generalized): Secondary | ICD-10-CM

## 2022-08-09 DIAGNOSIS — R2689 Other abnormalities of gait and mobility: Secondary | ICD-10-CM

## 2022-08-09 NOTE — Therapy (Signed)
OUTPATIENT PHYSICAL THERAPY PROSTHETICS TREATMENT   Patient Name: Kerry Randall MRN: [id number] DOB:03/21/1961, 61 y.o., female Today's Date: 08/09/2022  PCP: Dr. Brett Albino - [facility] REFERRING PROVIDER: Meridee Score, MD   PT End of Session - 08/09/22 1311     Visit Number 2    Number of Visits 16    Date for PT Re-Evaluation 10/01/22    Authorization Type UHC Medicare ([employer] [place] Secondary)    Authorization Time Period No Auth, No visit limit    Progress Note Due on Visit 10    PT Start Time 1305    PT Stop Time 1345    PT Time Calculation (min) 40 min    Equipment Utilized During Treatment Gait belt    Activity Tolerance Patient tolerated treatment well    Behavior During Therapy WFL for tasks assessed/performed             Past Medical History:  Diagnosis Date   Atrial fibrillation ([place])    Colon polyps    Diabetes mellitus without complication ([place])    GERD (gastroesophageal reflux disease)    Heart disease, hyperkinetic    Hypercholesteremia    Hypertension    Kidney disease    Leg pain    Major depressive disorder, recurrent ([place])    Palpitations    Vitamin D deficiency    Past Surgical History:  Procedure Laterality Date   ABDOMINAL AORTOGRAM W/LOWER EXTREMITY N/A 04/26/2021   Procedure: ABDOMINAL AORTOGRAM W/LOWER EXTREMITY;  Surgeon: Wellington Hampshire, MD;  Location: [place] [facility];  Service: Cardiovascular;  Laterality: N/A;   ABDOMINAL AORTOGRAM W/LOWER EXTREMITY Right 04/02/2022   Procedure: ABDOMINAL AORTOGRAM W/LOWER EXTREMITY;  Surgeon: Cherre Robins, MD;  Location: [place] [facility];  Service: Vascular;  Laterality: Right;   AMPUTATION Right 04/04/2022   Procedure: RIGHT BELOW KNEE AMPUTATION;  Surgeon: Newt Minion, MD;  Location: [place];  Service: Orthopedics;  Laterality: Right;   AMPUTATION TOE Bilateral    small toe on left foot; all toes on right foot   PERIPHERAL VASCULAR BALLOON ANGIOPLASTY Right 04/26/2021   Procedure:  PERIPHERAL VASCULAR BALLOON ANGIOPLASTY;  Surgeon: Wellington Hampshire, MD;  Location: [place] [facility];  Service: Cardiovascular;  Laterality: Right;  superficial femoral   PERIPHERAL VASCULAR INTERVENTION Right 04/26/2021   Procedure: PERIPHERAL VASCULAR INTERVENTION;  Surgeon: Wellington Hampshire, MD;  Location: [place] [facility];  Service: Cardiovascular;  Laterality: Right;  Iliac stent   PERIPHERAL VASCULAR INTERVENTION Bilateral 04/02/2022   Procedure: PERIPHERAL VASCULAR INTERVENTION;  Surgeon: Cherre Robins, MD;  Location: [place] [facility];  Service: Vascular;  Laterality: Bilateral;  common iliac   TOTAL VAGINAL HYSTERECTOMY  12/1998   Patient Active Problem List   Diagnosis Date Noted   Severe protein-calorie malnutrition ([place])    Diabetic infection of right foot ([place]) 03/29/2022   Stage 3a chronic kidney disease (CKD) ([place]) 03/29/2022   Atrial fibrillation (H[facility])    Insulin-requiring or dependent type II diabetes mellitus (HCC)    PAD (peripheral artery disease) (HCC)    Major depressive disorder, recurrent (HCC)    CAD (coronary artery disease)    Gangrene of right foot ([place])    Critical lower limb ischemia ([place]) 04/26/2021    ONSET DATE: 04/04/22 Rt BKA  REFERRING DIAG: Z89.511 (ICD-10-CM) - History of right below knee amputation   THERAPY DIAG:  No diagnosis found.  Rationale for Evaluation and Treatment Rehabilitation  SUBJECTIVE:   SUBJECTIVE STATEMENT: Pt  states she's was wearing her prosthesis for 2 hrs a day up to a couple days ago due to soreness at the knee.   Reports no redness or open areas as she checks daily.  States more inner knee pain from arthritis and ongoing knee issues from 10 years prior.  Reports she has phantom pain in the top of her Rt foot with standing.    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 After hospitalization, she underwent rehab at a SNF for about 2 weeks. No HHPT. Reports she has not been  exercising. Received RLE prosthesis last Monday from [facility]. She reports she knows very little about the prosthesis, but was able to walk in // bars a few times while having it fitted. She has been instructed to wear 2 hours each day AM/PM and has been working up to this. It has been sore adjusting to wearing this. [facility].  Pt accompanied by: self Has been receiving transportation services.    PAIN:  Are you having pain? Yes: NPRS scale:  no pain upon entering but increased with activity to 8/10 Pain location: Rt medial knee Pain description: sore Aggravating factors: walking, wearing prosthesis Relieving factors: rest   PRECAUTIONS: None  WEIGHT BEARING RESTRICTIONS No  FALLS: Has patient fallen in last 6 months? Yes. Number of falls 6  LIVING ENVIRONMENT: Lives with: lives alone Lives in: House/apartment Home Access: Level entry Home layout: One level Stairs: No (there is one ramp to get to apartment - 25ft) Has following equipment at home: Single point cane, Walker - 2 wheeled, and Wheelchair (manual)  PLOF: Independent  PATIENT GOALS   Patient would like to be fully independent, walking again. Pick up grandchildren, shop, participate in family and community activities without the wheelchair.  OBJECTIVE:  COGNITION: Overall cognitive status: Within functional limits for tasks assessed   SENSATION: [facility] She does report phantom pains. MUSCLE LENGTH: Hamstrings: Right 35 deg from full ext;  Thomas test: Right 17 deg past extension;   LOWER EXTREMITY ROM:     Active  Right eval Left eval  Hip flexion    Hip extension    Hip abduction    Hip adduction    Hip internal rotation    Hip external rotation    Knee flexion 107 122  Knee extension -14 0  Ankle dorsiflexion    Ankle plantarflexion    Ankle inversion    Ankle eversion     (Blank rows = not tested)    LOWER EXTREMITY MMT:  MMT Right eval Left eval  Hip  flexion 4+ 4+  Hip extension 4 4+  Hip abduction 4 5  Hip adduction 4+ 4+  Hip internal rotation    Hip external rotation    Knee flexion 5 5  Knee extension 4+ 5  Ankle dorsiflexion    Ankle plantarflexion    Ankle inversion    Ankle eversion    (Blank rows = not tested)   TRANSFERS: Squat pivot transfer: Complete Independence (without AD)  RAMP Complete Independence  GAIT: Gait pattern: step to pattern, step through pattern, decreased stance time- Right, decreased stride length, Right steppage, Right foot flat, knee flexed in stance- Right, antalgic, and decreased trunk rotation Distance walked: 20' Assistive device utilized: Environmental consultant - 2 wheeled Level of assistance: CGA Comments: Demonstrates good alignment with prosthesis; Rt knee is flexed throughout gait cycle with some mild buckling noted and antalgic  patterning.  FUNCTIONAL TESTs:  30 seconds chair stand test 10 times in 30 seconds, reliant on arm rests and Lt foot offset Timed up and go (TUG): 31.5 with RW  CURRENT PROSTHETIC WEAR ASSESSMENT: Patient is independent with: residual limb care, care of non-amputated limb, prosthetic cleaning, and proper wear schedule/adjustment Patient is dependent with: correct ply sock adjustment Donning prosthesis: Complete Independence Doffing prosthesis: Modified independence Prosthetic wear tolerance: 2 hours/day, 7 days/week Prosthetic weight bearing tolerance: Not tested Edema: not tested Residual limb condition: scars healed, 2 areas of skin abrasion without overt redness distal residual tiba, and proximal to knee approx 10 cm 2 areas  PATIENT SURVEYS:  LEFS 24/80 = 30%  TODAY'S TREATMENT:  08/09/22 Standing:  in //bars with bil UE assist walking in //bars 2RT for warmup  HIp abduction 10X each  HIp extension 10X each ` Alternating march 10X each  Walking with RW Seated:  LAQ 10X each  Sit to stands from elevated surface no UE 10X (keeping feet even) Supine:  Bridge  2X10 Prone: knee extension 2 minutes  Hip extension 2X10  TREATMENT:  PATIENT EDUCATED ON FOLLOWING PROSTHETIC CARE: Prosthetic wear tolerance: 2 hours/ 2x/day, 7 days/week Prosthetic weight bearing tolerance: Reassess during future visits Other education  Skin check, Residual limb care, Correct ply sock adjustment, Proper doffing, and Proper wear schedule/adjustment Low load long duration stretching of Rt knee to increase extension.   PATIENT EDUCATION: Education details: Findings, POC, knee extension ROM, skin checks, ply socks (use not needed yet.) Person educated: Patient Education method: Explanation Education comprehension: verbalized understanding   HOME EXERCISE PROGRAM:   ASSESSMENT:  CLINICAL IMPRESSION: Goals reviewed as well as HEP.  Began session with standing exercises.  Pt fatigues quickly and requires multimodal cues to remain upright with posturing.  Worked on improving knee extension with addition of prone lying.  Encouraged to do this at home.  Added bridge for glute strength with noted challenge.  Observed 2 small areas of broken skin on end of residual limb as well as blanched area after removing prosthesis.  Pt admits to not wearing shrinker correctly and educated on how this keeps the limb formed for better fit inside the prosthesis.  Encouraged to wear this and keep close check on blistering/friction areas.  Patient will continue to benefit from skilled physical therapy intervention in order to improve function and reduce impairments.    OBJECTIVE IMPAIRMENTS Abnormal gait, decreased activity tolerance, decreased balance, decreased endurance, decreased knowledge of condition, decreased knowledge of use of DME, decreased mobility, difficulty walking, decreased ROM, decreased strength, impaired flexibility, improper body mechanics, and prosthetic dependency .   ACTIVITY LIMITATIONS carrying, lifting, bending, standing, squatting, stairs, transfers, and locomotion  level  PARTICIPATION LIMITATIONS: driving, shopping, and community activity  PERSONAL FACTORS Time since onset of injury/illness/exacerbation and Transportation are also affecting patient's functional outcome.   REHAB POTENTIAL: Excellent  CLINICAL DECISION MAKING: Stable/uncomplicated  EVALUATION COMPLEXITY: Low   GOALS: Goals reviewed with patient? Yes  SHORT TERM GOALS: Target date: 09/03/22  Patient will be independent with initial HEP and self-management strategies to improve functional outcomes Baseline: Initiated Goal status: IN PROGRESS    2. Patient will improve 30 second chair stand test to 12 stands in 30 seconds to show significant improvement in functional strength and transfers Baseline: 10x in 30 seconds Goal status: IN progress   LONG TERM GOALS: Target date: 10/01/22  Patient will be independent with advanced HEP and self-management strategies to improve functional outcomes Baseline: n/a  Goal status: IN PROGRESS  2.  Patient will improve LEFS score by 9 points to indicate improvement in functional outcomes Baseline: 24/80 Goal status: IN PROGRESS  3.  Patient will have X knee AROM 0-120 degrees to improve functional mobility and facilitate squatting to pick up items from floor. Baseline: 0-14-107 Goal status: IN PROGRESS  4. Patient will have equal to or > 4+/5 MMT throughout 5/5 to improve ability to perform functional mobility, stair ambulation and ADLs.  Baseline: see above Goal status: IN PROGRESS  5. Patient will be able to ambulate =/>5 minutes with LRAD to increase participation in family and community activities out of the house. Baseline: Assess next visit Goal status: IN PROGRESS   6. Patient will improve TUG to <20 seconds with LRAD and prosthetic to show significant improvement and independence with transfers and ambulation. Baseline: 31.5 seconds with RW Goal status: INITIAL   PLAN: PT FREQUENCY: 2x/week  PT DURATION: 8  weeks  PLANNED INTERVENTIONS: Therapeutic exercises, Therapeutic activity, Neuromuscular re-education, Balance training, Gait training, Patient/Family education, Self Care, Joint mobilization, Stair training, Orthotic/Fit training, Prosthetic training, DME instructions, Dry Needling, Electrical stimulation, Wheelchair mobility training, Cryotherapy, Moist heat, scar mobilization, Taping, Ultrasound, Biofeedback, Ionotophoresis 4mg /ml Dexamethasone, Manual therapy, and Re-evaluation  PLAN FOR NEXT SESSION:  Prosthetic education, gait training parallel bars>RW>cane as able to progress. LE strength and ROM (Rt knee with fair contracture.)   , PTA 08/09/2022, 5:15 PM Lindbergh Winkles 08/11/2022, PTA/CLT [facility] [place] Ph: [phone]

## 2022-08-23 ENCOUNTER — Ambulatory Visit: Payer: Medicare Other | Admitting: Orthopedic Surgery

## 2022-08-28 ENCOUNTER — Ambulatory Visit (HOSPITAL_COMMUNITY): Payer: 59 | Attending: Orthopedic Surgery | Admitting: Physical Therapy

## 2022-08-28 DIAGNOSIS — Z89511 Acquired absence of right leg below knee: Secondary | ICD-10-CM | POA: Insufficient documentation

## 2022-08-28 DIAGNOSIS — R262 Difficulty in walking, not elsewhere classified: Secondary | ICD-10-CM | POA: Diagnosis not present

## 2022-08-28 DIAGNOSIS — M6281 Muscle weakness (generalized): Secondary | ICD-10-CM | POA: Diagnosis present

## 2022-08-28 DIAGNOSIS — R2689 Other abnormalities of gait and mobility: Secondary | ICD-10-CM | POA: Diagnosis present

## 2022-08-28 NOTE — Therapy (Signed)
OUTPATIENT PHYSICAL THERAPY PROSTHETICS TREATMENT   Patient Name: Kerry Randall MRN: [id number] DOB:13-Jun-1961, 61 y.o., female Today's Date: 08/28/2022  PCP: Dr. Nancy Marus - [facility] REFERRING PROVIDER: Aldean Baker, MD   PT End of Session - 08/28/22 0906     Visit Number 3    Number of Visits 16    Date for PT Re-Evaluation 10/01/22    Authorization Type [employer] Medicare ([place] [place])    Authorization Time Period No Auth, No visit limit    Progress Note Due on Visit 10    PT Start Time 0904    PT Stop Time 0945    PT Time Calculation (min) 41 min    Equipment Utilized During Treatment Gait belt    Activity Tolerance Patient tolerated treatment well    Behavior During Therapy WFL for tasks assessed/performed             Past Medical History:  Diagnosis Date   Atrial fibrillation (HCC)    Colon polyps    Diabetes mellitus without complication (HCC)    GERD (gastroesophageal reflux disease)    Heart disease, hyperkinetic    Hypercholesteremia    Hypertension    Kidney disease    Leg pain    Major depressive disorder, recurrent (HCC)    Palpitations    Vitamin D deficiency    Past Surgical History:  Procedure Laterality Date   ABDOMINAL AORTOGRAM W/LOWER EXTREMITY N/A 04/26/2021   Procedure: ABDOMINAL AORTOGRAM W/LOWER EXTREMITY;  Surgeon: Iran Ouch, MD;  Location: [facility];  Service: Cardiovascular;  Laterality: N/A;   ABDOMINAL AORTOGRAM W/LOWER EXTREMITY Right 04/02/2022   Procedure: ABDOMINAL AORTOGRAM W/LOWER EXTREMITY;  Surgeon: Leonie Douglas, MD;  Location: [facility];  Service: Vascular;  Laterality: Right;   AMPUTATION Right 04/04/2022   Procedure: RIGHT BELOW KNEE AMPUTATION;  Surgeon: Nadara Mustard, MD;  Location: [facility] OR;  Service: Orthopedics;  Laterality: Right;   AMPUTATION TOE Bilateral    small toe on left foot; all toes on right foot   PERIPHERAL VASCULAR BALLOON ANGIOPLASTY Right 04/26/2021   Procedure:  PERIPHERAL VASCULAR BALLOON ANGIOPLASTY;  Surgeon: Iran Ouch, MD;  Location: [facility];  Service: Cardiovascular;  Laterality: Right;  superficial femoral   PERIPHERAL VASCULAR INTERVENTION Right 04/26/2021   Procedure: PERIPHERAL VASCULAR INTERVENTION;  Surgeon: Iran Ouch, MD;  Location: [facility];  Service: Cardiovascular;  Laterality: Right;  Iliac stent   PERIPHERAL VASCULAR INTERVENTION Bilateral 04/02/2022   Procedure: PERIPHERAL VASCULAR INTERVENTION;  Surgeon: Leonie Douglas, MD;  Location: [facility];  Service: Vascular;  Laterality: Bilateral;  common iliac   TOTAL VAGINAL HYSTERECTOMY  12/1998   Patient Active Problem List   Diagnosis Date Noted   Severe protein-calorie malnutrition (HCC)    Diabetic infection of right foot (HCC) 03/29/2022   Stage 3a chronic kidney disease (CKD) (HCC) 03/29/2022   Atrial fibrillation (HCC)    Insulin-requiring or dependent type II diabetes mellitus (HCC)    PAD (peripheral artery disease) (HCC)    Major depressive disorder, recurrent (HCC)    CAD (coronary artery disease)    Gangrene of right foot (HCC)    Critical lower limb ischemia (HCC) 04/26/2021    ONSET DATE: 04/04/22 Rt BKA  REFERRING DIAG: Z89.511 (ICD-10-CM) - History of right below knee amputation   THERAPY DIAG:  Difficulty in walking, not elsewhere classified  Muscle weakness (generalized)  Other abnormalities of gait and mobility  Hx of right BKA (HCC)  Rationale for Evaluation and Treatment Rehabilitation  SUBJECTIVE:   SUBJECTIVE STATEMENT: Pt states she returned to [facility] last week and they readjusted her prosthesis.  States she kept it on a little too long following and had some discomfort at medial side where prosthesis comes up.   No signs of wounds, friction or rubbing.  States she feels her toes are jammed up in her shoe on that side.  No real pain today.  Admits to not doing the prone lying due to difficulty  breathing in prone  Evaluation:  Pt reports she developed a LE infection and underwent Rt BKA on 04/04/22. After hospitalization, she underwent rehab at a SNF for about 2 weeks. No HHPT. Reports she has not been exercising. Received RLE prosthesis last Monday from [facility]. She reports she knows very little about the prosthesis, but was able to walk in // bars a few times while having it fitted. She has been instructed to wear 2 hours each day AM/PM and has been working up to this. It has been sore adjusting to wearing this. [facility].  Pt accompanied by: self Has been receiving transportation services.  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 No  PRECAUTIONS: None  WEIGHT BEARING RESTRICTIONS No  FALLS: Has patient fallen in last 6 months? Yes. Number of falls 6  LIVING ENVIRONMENT: Lives with: lives alone Lives in: House/apartment Home Access: Level entry Home layout: One level Stairs: No (there is one ramp to get to apartment - 65ft) Has following equipment at home: Single point cane, Walker - 2 wheeled, and Wheelchair (manual)  PLOF: Independent  PATIENT GOALS   Patient would like to be fully independent, walking again. Pick up grandchildren, shop, participate in family and community activities without the wheelchair.  OBJECTIVE:  COGNITION: Overall cognitive status: Within functional limits for tasks assessed   SENSATION: [facility] She does report phantom pains. MUSCLE LENGTH: Hamstrings: Right 35 deg from full ext;  Thomas test: Right 17 deg past extension;   LOWER EXTREMITY ROM:     Active  Right eval Left eval  Hip flexion    Hip extension    Hip abduction    Hip adduction    Hip internal rotation    Hip external rotation    Knee flexion 107 122  Knee extension -14 0  Ankle dorsiflexion    Ankle plantarflexion    Ankle inversion    Ankle eversion     (Blank rows = not tested)    LOWER  EXTREMITY MMT:  MMT Right eval Left eval  Hip flexion 4+ 4+  Hip extension 4 4+  Hip abduction 4 5  Hip adduction 4+ 4+  Hip internal rotation    Hip external rotation    Knee flexion 5 5  Knee extension 4+ 5  Ankle dorsiflexion    Ankle plantarflexion    Ankle inversion    Ankle eversion    (Blank rows = not tested)   TRANSFERS: Squat pivot transfer: Complete Independence (without AD)  RAMP Complete Independence  GAIT: Gait pattern: step to pattern, step through pattern, decreased stance time- Right, decreased stride length, Right steppage, Right foot flat, knee flexed in stance- Right, antalgic, and decreased trunk rotation Distance walked: 20' Assistive device utilized: Environmental consultant - 2 wheeled Level of assistance: CGA Comments: Demonstrates good alignment with prosthesis; Rt knee is flexed throughout gait  cycle with some mild buckling noted and antalgic patterning.  FUNCTIONAL TESTs:  30 seconds chair stand test 10 times in 30 seconds, reliant on arm rests and Lt foot offset Timed up and go (TUG): 31.5 with RW  CURRENT PROSTHETIC WEAR ASSESSMENT: Patient is independent with: residual limb care, care of non-amputated limb, prosthetic cleaning, and proper wear schedule/adjustment Patient is dependent with: correct ply sock adjustment Donning prosthesis: Complete Independence Doffing prosthesis: Modified independence Prosthetic wear tolerance: 2 hours/day, 7 days/week Prosthetic weight bearing tolerance: Not tested Edema: not tested Residual limb condition: scars healed, 2 areas of skin abrasion without overt redness distal residual tiba, and proximal to knee approx 10 cm 2 areas  PATIENT SURVEYS:  LEFS 24/80 = 30%  TODAY'S TREATMENT:  08/28/22 Standing:  in //bars with bil UE assist  Hip abduction 10X2 each  Hip extension 10X2 each ` Alternating march 10X2 each  4" step up 15X with bil UE, 10X with 1 UE, 5X without UE  Stair negotiation 4" side reciprocally 1RT  with bil HR, 1RT with 1 HR (step to descending)  Walking with RW in dept 150 feet Seated: sit to stand no UE from standard chair 10X  Doffing of prosthesis, residual limb inspection  08/09/22 Standing:  in //bars with bil UE assist walking in //bars 2RT for warmup  HIp abduction 10X each  HIp extension 10X each ` Alternating march 10X each  Walking with RW Seated:  LAQ 10X each  Sit to stands from elevated surface no UE 10X (keeping feet even) Supine:  Bridge 2X10 Prone: knee extension 2 minutes  Hip extension 2X10  TREATMENT:  PATIENT EDUCATED ON FOLLOWING PROSTHETIC CARE: Prosthetic wear tolerance: 2 hours/ 2x/day, 7 days/week Prosthetic weight bearing tolerance: Reassess during future visits Other education  Skin check, Residual limb care, Correct ply sock adjustment, Proper doffing, and Proper wear schedule/adjustment Low load long duration stretching of Rt knee to increase extension.   PATIENT EDUCATION: Education details: Findings, POC, knee extension ROM, skin checks, ply socks (use not needed yet.) Person educated: Patient Education method: Explanation Education comprehension: verbalized understanding   HOME EXERCISE PROGRAM:   ASSESSMENT:  CLINICAL IMPRESSION: Continued with established exercises.  Able to add additional set with less fatigue noted today as compared to last visit.  Continues to need multimodal cues to remain upright and complete exercises in good form without substitution. Encouraged again to do prone lying at home to work on knee extension.   Noted Rt LE appears shorter now.  PT reports she tried adding a sock as thought this was why, however unable to get her leg on with added sock.   Progressed to step ups with ability to complete without UE assist and now able to stand from chair without UE and without using elevated surface.  Pt with steps at apartment facility that she would have to use in an emergency.  Worked on stair negotiation using 1 HR (has  1 HR but too far apart to use both).  PT able to negotiate 4" steps using 1 HR reciprocally ascending but used step to pattern to descend.  Pt will need additional training and progression to 7" step height. Checked end of residual limb without any broken areas ( did have 2 small areas of broken skin on end of residual limb as well as blanched area after removing prosthesis at last visit). Pt now wearing her shrinker correctly.  Patient will continue to benefit from skilled physical therapy intervention in order to  improve function and reduce impairments.    OBJECTIVE IMPAIRMENTS Abnormal gait, decreased activity tolerance, decreased balance, decreased endurance, decreased knowledge of condition, decreased knowledge of use of DME, decreased mobility, difficulty walking, decreased ROM, decreased strength, impaired flexibility, improper body mechanics, and prosthetic dependency .   ACTIVITY LIMITATIONS carrying, lifting, bending, standing, squatting, stairs, transfers, and locomotion level  PARTICIPATION LIMITATIONS: driving, shopping, and community activity  PERSONAL FACTORS Time since onset of injury/illness/exacerbation and Transportation are also affecting patient's functional outcome.   REHAB POTENTIAL: Excellent  CLINICAL DECISION MAKING: Stable/uncomplicated  EVALUATION COMPLEXITY: Low   GOALS: Goals reviewed with patient? Yes  SHORT TERM GOALS: Target date: 09/03/22  Patient will be independent with initial HEP and self-management strategies to improve functional outcomes Baseline: Initiated Goal status: IN PROGRESS    2. Patient will improve 30 second chair stand test to 12 stands in 30 seconds to show significant improvement in functional strength and transfers Baseline: 10x in 30 seconds Goal status: IN progress   LONG TERM GOALS: Target date: 10/01/22  Patient will be independent with advanced HEP and self-management strategies to improve functional outcomes Baseline:  n/a Goal status: IN PROGRESS  2.  Patient will improve LEFS score by 9 points to indicate improvement in functional outcomes Baseline: 24/80 Goal status: IN PROGRESS  3.  Patient will have X knee AROM 0-120 degrees to improve functional mobility and facilitate squatting to pick up items from floor. Baseline: 0-14-107 Goal status: IN PROGRESS  4. Patient will have equal to or > 4+/5 MMT throughout 5/5 to improve ability to perform functional mobility, stair ambulation and ADLs.  Baseline: see above Goal status: IN PROGRESS  5. Patient will be able to ambulate =/>5 minutes with LRAD to increase participation in family and community activities out of the house. Baseline: Assess next visit Goal status: IN PROGRESS   6. Patient will improve TUG to <20 seconds with LRAD and prosthetic to show significant improvement and independence with transfers and ambulation. Baseline: 31.5 seconds with RW Goal status: IN PROGRESS   PLAN: PT FREQUENCY: 2x/week  PT DURATION: 8 weeks  PLANNED INTERVENTIONS: Therapeutic exercises, Therapeutic activity, Neuromuscular re-education, Balance training, Gait training, Patient/Family education, Self Care, Joint mobilization, Stair training, Orthotic/Fit training, Prosthetic training, DME instructions, Dry Needling, Electrical stimulation, Wheelchair mobility training, Cryotherapy, Moist heat, scar mobilization, Taping, Ultrasound, Biofeedback, Ionotophoresis 4mg /ml Dexamethasone, Manual therapy, and Re-evaluation  PLAN FOR NEXT SESSION:  Prosthetic education, gait training parallel bars>RW>cane as able to progress. LE strength and ROM (Rt knee with fair contracture.)   Teena Irani, PTA 08/28/2022, 9:06 AM Teena Irani, PTA/CLT [place] Ph: [phone]

## 2022-08-30 ENCOUNTER — Encounter (HOSPITAL_COMMUNITY): Payer: Self-pay | Admitting: Physical Therapy

## 2022-08-30 ENCOUNTER — Ambulatory Visit (HOSPITAL_COMMUNITY): Payer: 59 | Admitting: Physical Therapy

## 2022-08-30 DIAGNOSIS — R2689 Other abnormalities of gait and mobility: Secondary | ICD-10-CM

## 2022-08-30 DIAGNOSIS — M6281 Muscle weakness (generalized): Secondary | ICD-10-CM

## 2022-08-30 DIAGNOSIS — Z89511 Acquired absence of right leg below knee: Secondary | ICD-10-CM

## 2022-08-30 DIAGNOSIS — R262 Difficulty in walking, not elsewhere classified: Secondary | ICD-10-CM

## 2022-08-30 NOTE — Therapy (Signed)
OUTPATIENT PHYSICAL THERAPY PROSTHETICS TREATMENT   Patient Name: Kerry Randall MRN: [id number] DOB:[birth year], 61 y.o., female Today's Date: 08/30/2022  PCP: Dr. Brett Albino - [facility] REFERRING PROVIDER: Meridee Score, MD   PT End of Session - 08/30/22 0823     Visit Number 4    Number of Visits 16    Date for PT Re-Evaluation 10/01/22    Authorization Type UHC Medicare ([place] [place])    Authorization Time Period No Auth, No visit limit    Progress Note Due on Visit 10    PT Start Time 0822    PT Stop Time 0900    PT Time Calculation (min) 38 min    Equipment Utilized During Treatment Gait belt    Activity Tolerance Patient tolerated treatment well    Behavior During Therapy WFL for tasks assessed/performed             Past Medical History:  Diagnosis Date   Atrial fibrillation ([place])    Colon polyps    Diabetes mellitus without complication ([place])    GERD (gastroesophageal reflux disease)    Heart disease, hyperkinetic    Hypercholesteremia    Hypertension    Kidney disease    Leg pain    Major depressive disorder, recurrent ([place])    Palpitations    Vitamin D deficiency    Past Surgical History:  Procedure Laterality Date   ABDOMINAL AORTOGRAM W/LOWER EXTREMITY N/A 04/26/2021   Procedure: ABDOMINAL AORTOGRAM W/LOWER EXTREMITY;  Surgeon: Wellington Hampshire, MD;  Location: [facility];  Service: Cardiovascular;  Laterality: N/A;   ABDOMINAL AORTOGRAM W/LOWER EXTREMITY Right 04/02/2022   Procedure: ABDOMINAL AORTOGRAM W/LOWER EXTREMITY;  Surgeon: Cherre Robins, MD;  Location: [facility];  Service: Vascular;  Laterality: Right;   AMPUTATION Right 04/04/2022   Procedure: RIGHT BELOW KNEE AMPUTATION;  Surgeon: Newt Minion, MD;  Location: [facility];  Service: Orthopedics;  Laterality: Right;   AMPUTATION TOE Bilateral    small toe on left foot; all toes on right foot   PERIPHERAL VASCULAR BALLOON ANGIOPLASTY Right 04/26/2021   Procedure:  PERIPHERAL VASCULAR BALLOON ANGIOPLASTY;  Surgeon: Wellington Hampshire, MD;  Location: [facility];  Service: Cardiovascular;  Laterality: Right;  superficial femoral   PERIPHERAL VASCULAR INTERVENTION Right 04/26/2021   Procedure: PERIPHERAL VASCULAR INTERVENTION;  Surgeon: Wellington Hampshire, MD;  Location: [facility];  Service: Cardiovascular;  Laterality: Right;  Iliac stent   PERIPHERAL VASCULAR INTERVENTION Bilateral 04/02/2022   Procedure: PERIPHERAL VASCULAR INTERVENTION;  Surgeon: Cherre Robins, MD;  Location: [facility];  Service: Vascular;  Laterality: Bilateral;  common iliac   TOTAL VAGINAL HYSTERECTOMY  12/1998   Patient Active Problem List   Diagnosis Date Noted   Severe protein-calorie malnutrition ([place])    Diabetic infection of right foot ([place]) 03/29/2022   Stage 3a chronic kidney disease (CKD) ([place]) 03/29/2022   Atrial fibrillation (H[facility])    Insulin-requiring or dependent type II diabetes mellitus (HCC)    PAD (peripheral artery disease) (H[facility])    Major depressive disorder, recurrent ([facility])    CAD (coronary artery disease)    Gangrene of right foot ([place])    Critical lower limb ischemia ([place]) 04/26/2021    ONSET DATE: 04/04/22 Rt BKA  REFERRING DIAG: Z89.511 (ICD-10-CM) - History of right below knee amputation   THERAPY DIAG:  Difficulty in walking, not elsewhere classified  Muscle weakness (generalized)  Other abnormalities of gait and mobility  Hx of right BKA ([place])  Rationale for Evaluation and Treatment Rehabilitation  SUBJECTIVE:   SUBJECTIVE STATEMENT: Pt states everything is good with the prosthetic. Leg was loose after she got home after last session. Really wants to work on steps.   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 After hospitalization, she underwent rehab at a SNF for about 2 weeks. No HHPT. Reports she has not been exercising. Received RLE prosthesis last Monday from [facility]. She reports she knows very little about the prosthesis, but was able to walk in // bars a few times while having it fitted. She has been instructed to wear 2 hours each day AM/PM and has been working up to this. It has been sore adjusting to wearing this. [facility].  Pt accompanied by: self Has been receiving transportation services.  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 No  PRECAUTIONS: None  WEIGHT BEARING RESTRICTIONS No  FALLS: Has patient fallen in last 6 months? Yes. Number of falls 6  LIVING ENVIRONMENT: Lives with: lives alone Lives in: House/apartment Home Access: Level entry Home layout: One level Stairs: No (there is one ramp to get to apartment - 67ft) Has following equipment at home: Single point cane, Walker - 2 wheeled, and Wheelchair (manual)  PLOF: Independent  PATIENT GOALS   Patient would like to be fully independent, walking again. Pick up grandchildren, shop, participate in family and community activities without the wheelchair.  OBJECTIVE:  COGNITION: Overall cognitive status: Within functional limits for tasks assessed   SENSATION: [facility] She does report phantom pains. MUSCLE LENGTH: Hamstrings: Right 35 deg from full ext;  Thomas test: Right 17 deg past extension;   LOWER EXTREMITY ROM:     Active  Right eval Left eval  Hip flexion    Hip extension    Hip abduction    Hip adduction    Hip internal rotation    Hip external rotation    Knee flexion 107 122  Knee extension -14 0  Ankle dorsiflexion    Ankle plantarflexion    Ankle inversion    Ankle eversion     (Blank rows = not tested)    LOWER EXTREMITY MMT:  MMT Right eval Left eval  Hip flexion 4+ 4+  Hip extension 4 4+  Hip abduction 4 5  Hip adduction 4+ 4+  Hip internal rotation    Hip external rotation    Knee flexion 5 5  Knee extension 4+ 5  Ankle dorsiflexion    Ankle plantarflexion    Ankle  inversion    Ankle eversion    (Blank rows = not tested)   TRANSFERS: Squat pivot transfer: Complete Independence (without AD)  RAMP Complete Independence  GAIT: Gait pattern: step to pattern, step through pattern, decreased stance time- Right, decreased stride length, Right steppage, Right foot flat, knee flexed in stance- Right, antalgic, and decreased trunk rotation Distance walked: 20' Assistive device utilized: Environmental consultant - 2 wheeled Level of assistance: CGA Comments: Demonstrates good alignment with prosthesis; Rt knee is flexed throughout gait cycle with some mild buckling noted and antalgic patterning.  FUNCTIONAL TESTs:  30 seconds chair stand test 10 times in 30 seconds, reliant on arm rests and Lt foot offset Timed up and go (TUG): 31.5 with RW  CURRENT PROSTHETIC WEAR ASSESSMENT: Patient is independent with: residual limb care, care of non-amputated  limb, prosthetic cleaning, and proper wear schedule/adjustment Patient is dependent with: correct ply sock adjustment Donning prosthesis: Complete Independence Doffing prosthesis: Modified independence Prosthetic wear tolerance: 2 hours/day, 7 days/week Prosthetic weight bearing tolerance: Not tested Edema: not tested Residual limb condition: scars healed, 2 areas of skin abrasion without overt redness distal residual tiba, and proximal to knee approx 10 cm 2 areas  PATIENT SURVEYS:  LEFS 24/80 = 30%  TODAY'S TREATMENT:  08/30/22 Standing hip abduction GTB at knees 2x 10 bilateral  Standing hip extension GTB at knees 2x 10 bilateral  Step up 6 inch 2x 10 bilateral  LAQ 10 x 5 second holds bilateral  Row 2x 15 GTB Shoulder extension GTB 2x 15 Stairs 4 inch alternating pattern; 1 RT bilateral UE support, 4 RT unilateral UE assist  08/28/22 Standing:  in //bars with bil UE assist  Hip abduction 10X2 each  Hip extension 10X2 each ` Alternating march 10X2 each  4" step up 15X with bil UE, 10X with 1 UE, 5X without  UE  Stair negotiation 4" side reciprocally 1RT with bil HR, 1RT with 1 HR (step to descending)  Walking with RW in dept 150 feet Seated: sit to stand no UE from standard chair 10X  Doffing of prosthesis, residual limb inspection  08/09/22 Standing:  in //bars with bil UE assist walking in //bars 2RT for warmup  HIp abduction 10X each  HIp extension 10X each ` Alternating march 10X each  Walking with RW Seated:  LAQ 10X each  Sit to stands from elevated surface no UE 10X (keeping feet even) Supine:  Bridge 2X10 Prone: knee extension 2 minutes  Hip extension 2X10  TREATMENT:  PATIENT EDUCATED ON FOLLOWING PROSTHETIC CARE: Prosthetic wear tolerance: 2 hours/ 2x/day, 7 days/week Prosthetic weight bearing tolerance: Reassess during future visits Other education  Skin check, Residual limb care, Correct ply sock adjustment, Proper doffing, and Proper wear schedule/adjustment Low load long duration stretching of Rt knee to increase extension.   PATIENT EDUCATION: Education details:08/30/22: prone positioning, HEP; EVAL: Findings, POC, knee extension ROM, skin checks, ply socks (use not needed yet.) Person educated: Patient Education method: Explanation Education comprehension: verbalized understanding   HOME EXERCISE PROGRAM: Access Code: [email or web address]  Date: 08/30/2022 - Standing Hip Abduction with Resistance at Ankles and Counter Support  - 2 x daily - 7 x weekly - 2 sets - 10 reps - Standing Hip Extension with Resistance at Ankles and Counter Support  - 2 x daily - 7 x weekly - 2 sets - 10 reps  ASSESSMENT:  CLINICAL IMPRESSION: Patient tolerating increased resistance with standing glute strengthening. Patient educated on prone positioning to improve hip/knee extension ROM. She requires bilateral UE support for step exercises. Added postural strengthening for balancing without UE support and core strength. Stairs improve with LUE support on rail but CGA provided for balance/strength  deficits. GTB given for HEP. Patient will continue to benefit from physical therapy in order to improve function and reduce impairment.    OBJECTIVE IMPAIRMENTS Abnormal gait, decreased activity tolerance, decreased balance, decreased endurance, decreased knowledge of condition, decreased knowledge of use of DME, decreased mobility, difficulty walking, decreased ROM, decreased strength, impaired flexibility, improper body mechanics, and prosthetic dependency .   ACTIVITY LIMITATIONS carrying, lifting, bending, standing, squatting, stairs, transfers, and locomotion level  PARTICIPATION LIMITATIONS: driving, shopping, and community activity  PERSONAL FACTORS Time since onset of injury/illness/exacerbation and Transportation are also affecting patient's functional outcome.   REHAB POTENTIAL: Excellent  CLINICAL DECISION MAKING:  Stable/uncomplicated  EVALUATION COMPLEXITY: Low   GOALS: Goals reviewed with patient? Yes  SHORT TERM GOALS: Target date: 09/03/22  Patient will be independent with initial HEP and self-management strategies to improve functional outcomes Baseline: Initiated Goal status: IN PROGRESS    2. Patient will improve 30 second chair stand test to 12 stands in 30 seconds to show significant improvement in functional strength and transfers Baseline: 10x in 30 seconds Goal status: IN progress   LONG TERM GOALS: Target date: 10/01/22  Patient will be independent with advanced HEP and self-management strategies to improve functional outcomes Baseline: n/a Goal status: IN PROGRESS  2.  Patient will improve LEFS score by 9 points to indicate improvement in functional outcomes Baseline: 24/80 Goal status: IN PROGRESS  3.  Patient will have X knee AROM 0-120 degrees to improve functional mobility and facilitate squatting to pick up items from floor. Baseline: 0-14-107 Goal status: IN PROGRESS  4. Patient will have equal to or > 4+/5 MMT throughout 5/5 to improve  ability to perform functional mobility, stair ambulation and ADLs.  Baseline: see above Goal status: IN PROGRESS  5. Patient will be able to ambulate =/>5 minutes with LRAD to increase participation in family and community activities out of the house. Baseline: Assess next visit Goal status: IN PROGRESS   6. Patient will improve TUG to <20 seconds with LRAD and prosthetic to show significant improvement and independence with transfers and ambulation. Baseline: 31.5 seconds with RW Goal status: IN PROGRESS   PLAN: PT FREQUENCY: 2x/week  PT DURATION: 8 weeks  PLANNED INTERVENTIONS: Therapeutic exercises, Therapeutic activity, Neuromuscular re-education, Balance training, Gait training, Patient/Family education, Self Care, Joint mobilization, Stair training, Orthotic/Fit training, Prosthetic training, DME instructions, Dry Needling, Electrical stimulation, Wheelchair mobility training, Cryotherapy, Moist heat, scar mobilization, Taping, Ultrasound, Biofeedback, Ionotophoresis 4mg /ml Dexamethasone, Manual therapy, and Re-evaluation  PLAN FOR NEXT SESSION:  Prosthetic education, gait training parallel bars>RW>cane as able to progress. LE strength and ROM (Rt knee with fair contracture.)   Vianne Bulls Mikalyn Hermida, PT 08/30/2022, 8:23 AM

## 2022-09-03 ENCOUNTER — Ambulatory Visit (HOSPITAL_COMMUNITY): Payer: 59 | Admitting: Physical Therapy

## 2022-09-03 DIAGNOSIS — R262 Difficulty in walking, not elsewhere classified: Secondary | ICD-10-CM

## 2022-09-03 DIAGNOSIS — M6281 Muscle weakness (generalized): Secondary | ICD-10-CM

## 2022-09-03 DIAGNOSIS — Z89511 Acquired absence of right leg below knee: Secondary | ICD-10-CM

## 2022-09-03 DIAGNOSIS — R2689 Other abnormalities of gait and mobility: Secondary | ICD-10-CM

## 2022-09-03 NOTE — Therapy (Signed)
OUTPATIENT PHYSICAL THERAPY PROSTHETICS TREATMENT   Patient Name: Kerry Randall MRN: [id number] DOB:1961/06/05, 61 y.o., female Today's Date: 09/03/2022  PCP: Dr. Nancy Marus - [facility] REFERRING PROVIDER: Aldean Baker, MD   PT End of Session - 09/03/22 1114     Visit Number 5    Number of Visits 16    Date for PT Re-Evaluation 10/01/22    Authorization Type UHC Medicare ([place])    Authorization Time Period No Auth, No visit limit    Progress Note Due on Visit 10    PT Start Time 1116    PT Stop Time 1155    PT Time Calculation (min) 39 min    Equipment Utilized During Treatment Gait belt    Activity Tolerance Patient tolerated treatment well    Behavior During Therapy WFL for tasks assessed/performed             Past Medical History:  Diagnosis Date   Atrial fibrillation (HCC)    Colon polyps    Diabetes mellitus without complication (HCC)    GERD (gastroesophageal reflux disease)    Heart disease, hyperkinetic    Hypercholesteremia    Hypertension    Kidney disease    Leg pain    Major depressive disorder, recurrent (HCC)    Palpitations    Vitamin D deficiency    Past Surgical History:  Procedure Laterality Date   ABDOMINAL AORTOGRAM W/LOWER EXTREMITY N/A 04/26/2021   Procedure: ABDOMINAL AORTOGRAM W/LOWER EXTREMITY;  Surgeon: Iran Ouch, MD;  [facility];  Service: Cardiovascular;  Laterality: N/A;   ABDOMINAL AORTOGRAM W/LOWER EXTREMITY Right 04/02/2022   Procedure: ABDOMINAL AORTOGRAM W/LOWER EXTREMITY;  Surgeon: Leonie Douglas, MD;  Location: [facility];  Service: Vascular;  Laterality: Right;   AMPUTATION Right 04/04/2022   Procedure: RIGHT BELOW KNEE AMPUTATION;  Surgeon: Nadara Mustard, MD;  Location: [facility] OR;  Service: Orthopedics;  Laterality: Right;   AMPUTATION TOE Bilateral    small toe on left foot; all toes on right foot   PERIPHERAL VASCULAR BALLOON ANGIOPLASTY Right 04/26/2021   Procedure:  PERIPHERAL VASCULAR BALLOON ANGIOPLASTY;  Surgeon: Iran Ouch, MD;  [facility];  Service: Cardiovascular;  Laterality: Right;  superficial femoral   PERIPHERAL VASCULAR INTERVENTION Right 04/26/2021   Procedure: PERIPHERAL VASCULAR INTERVENTION;  Surgeon: Iran Ouch, MD;  [facility];  Service: Cardiovascular;  Laterality: Right;  Iliac stent   PERIPHERAL VASCULAR INTERVENTION Bilateral 04/02/2022   Procedure: PERIPHERAL VASCULAR INTERVENTION;  Surgeon: Leonie Douglas, MD;  [facility]: [facility];  Service: Vascular;  Laterality: Bilateral;  common iliac   TOTAL VAGINAL HYSTERECTOMY  12/1998   Patient Active Problem List   Diagnosis Date Noted   Severe protein-calorie malnutrition (HCC)    Diabetic infection of right foot (HCC) 03/29/2022   Stage 3a chronic kidney disease (CKD) (HCC) 03/29/2022   Atrial fibrillation (HCC)    Insulin-requiring or dependent type II diabetes mellitus (HCC)    PAD (peripheral artery disease) (HCC)    Major depressive disorder, recurrent (HCC)    CAD (coronary artery disease)    Gangrene of right foot (HCC)    Critical lower limb ischemia (HCC) 04/26/2021    ONSET DATE: 04/04/22 Rt BKA  REFERRING DIAG: Z89.511 (ICD-10-CM) - History of right below knee amputation   THERAPY DIAG:  Difficulty in walking, not elsewhere classified  Muscle weakness (generalized)  Other abnormalities of gait and mobility  Hx of right BKA ([place])  Rationale for Evaluation and Treatment Rehabilitation  SUBJECTIVE:   SUBJECTIVE STATEMENT: Doing well. No pain. Compliant with HEP without issues.   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 After hospitalization, she underwent rehab at a SNF for about 2 weeks. No HHPT. Reports she has not been exercising. Received RLE prosthesis last Monday from [facility]. She reports she knows very little about the prosthesis, but was able to walk  in // bars a few times while having it fitted. She has been instructed to wear 2 hours each day AM/PM and has been working up to this. It has been sore adjusting to wearing this. Follow[facility]-up with [facility] Oct[facility] 4th.  Pt accompanied by: self Has been receiving transportation services.  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 No  PRECAUTIONS: None  WEIGHT BEARING RESTRICTIONS No  FALLS: Has patient fallen in last 6 months? Yes. Number of falls 6  LIVING ENVIRONMENT: Lives with: lives alone Lives in: House/apartment Home Access: Level entry Home layout: One level Stairs: No (there is one ramp to get to apartment - 76ft) Has following equipment at home: Single point cane, Walker - 2 wheeled, and Wheelchair (manual)  PLOF: Independent  PATIENT GOALS   Patient would like to be fully independent, walking again. Pick up grandchildren, shop, participate in family and community activities without the wheelchair.  OBJECTIVE:  COGNITION: Overall cognitive status: Within functional limits for tasks assessed   SENSATION: [facility] She does report phantom pains. MUSCLE LENGTH: Hamstrings: Right 35 deg from full ext;  Thomas test: Right 17 deg past extension;   LOWER EXTREMITY ROM:     Active  Right eval Left eval  Hip flexion    Hip extension    Hip abduction    Hip adduction    Hip internal rotation    Hip external rotation    Knee flexion 107 122  Knee extension -14 0  Ankle dorsiflexion    Ankle plantarflexion    Ankle inversion    Ankle eversion     (Blank rows = not tested)    LOWER EXTREMITY MMT:  MMT Right eval Left eval  Hip flexion 4+ 4+  Hip extension 4 4+  Hip abduction 4 5  Hip adduction 4+ 4+  Hip internal rotation    Hip external rotation    Knee flexion 5 5  Knee extension 4+ 5  Ankle dorsiflexion    Ankle plantarflexion    Ankle inversion    Ankle eversion    (Blank rows = not tested)   TRANSFERS: Squat  pivot transfer: Complete Independence (without AD)  RAMP Complete Independence  GAIT: Gait pattern: step to pattern, step through pattern, decreased stance time- Right, decreased stride length, Right steppage, Right foot flat, knee flexed in stance- Right, antalgic, and decreased trunk rotation Distance walked: 20' Assistive device utilized: Environmental consultant - 2 wheeled Level of assistance: CGA Comments: Demonstrates good alignment with prosthesis; Rt knee is flexed throughout gait cycle with some mild buckling noted and antalgic patterning.  FUNCTIONAL TESTs:  30 seconds chair stand test 10 times in 30 seconds, reliant on arm rests and Lt foot offset Timed up and go (TUG): 31.5 with RW  CURRENT PROSTHETIC WEAR ASSESSMENT: Patient is independent with: residual limb care, care of non-amputated limb, prosthetic cleaning, and proper wear schedule/adjustment Patient is dependent with: correct ply sock adjustment  Donning prosthesis: Complete Independence Doffing prosthesis: Modified independence Prosthetic wear tolerance: 2 hours/day, 7 days/week Prosthetic weight bearing tolerance: Not tested Edema: not tested Residual limb condition: scars healed, 2 areas of skin abrasion without overt redness distal residual tiba, and proximal to knee approx 10 cm 2 areas  PATIENT SURVEYS:  LEFS 24/80 = 30%  TODAY'S TREATMENT:  09/03/22 Seated:  Rows GTB x20    Shoulder extension GTB x20    LAQ 10x5"   Sit to stand x10 with no UE  Standing:   Step taps 4 inch x 20 HHA x1 Step ups 4 inch 2 x 10 each HHA x 1 Hip abduction RLE 2 x 10 Stairs 4 inch 3 RT HHA x 2 reciprocal Clinic amb 50' and 50' using RW   Nustep lv 2 5 min EOS for LE strength and ROM  (seat 7)     08/30/22 Standing hip abduction GTB at knees 2x 10 bilateral  Standing hip extension GTB at knees 2x 10 bilateral  Step up 6 inch 2x 10 bilateral  LAQ 10 x 5 second holds bilateral  Row 2x 15 GTB Shoulder extension GTB 2x 15 Stairs 4 inch  alternating pattern; 1 RT bilateral UE support, 4 RT unilateral UE assist  08/28/22 Standing:  in //bars with bil UE assist  Hip abduction 10X2 each  Hip extension 10X2 each ` Alternating march 10X2 each  4" step up 15X with bil UE, 10X with 1 UE, 5X without UE  Stair negotiation 4" side reciprocally 1RT with bil HR, 1RT with 1 HR (step to descending)  Walking with RW in dept 150 feet Seated: sit to stand no UE from standard chair 10X  Doffing of prosthesis, residual limb inspection  08/09/22 Standing:  in //bars with bil UE assist walking in //bars 2RT for warmup  HIp abduction 10X each  HIp extension 10X each ` Alternating march 10X each  Walking with RW Seated:  LAQ 10X each  Sit to stands from elevated surface no UE 10X (keeping feet even) Supine:  Bridge 2X10 Prone: knee extension 2 minutes  Hip extension 2X10  TREATMENT:  PATIENT EDUCATED ON FOLLOWING PROSTHETIC CARE: Prosthetic wear tolerance: 2 hours/ 2x/day, 7 days/week Prosthetic weight bearing tolerance: Reassess during future visits Other education  Skin check, Residual limb care, Correct ply sock adjustment, Proper doffing, and Proper wear schedule/adjustment Low load long duration stretching of Rt knee to increase extension.   PATIENT EDUCATION: Education details:08/30/22: prone positioning, HEP; EVAL: Findings, POC, knee extension ROM, skin checks, ply socks (use not needed yet.) Person educated: Patient Education method: Explanation Education comprehension: verbalized understanding   HOME EXERCISE PROGRAM: Access Code: [email or web address]  09/03/22 - Sit to Stand with Counter Support  - 2 x daily - 7 x weekly - 1-2 sets - 10 reps  Date: 08/30/2022 - Standing Hip Abduction with Resistance at Ankles and Counter Support  - 2 x daily - 7 x weekly - 2 sets - 10 reps - Standing Hip Extension with Resistance at Ankles and Counter Support  - 2 x daily - 7 x weekly - 2 sets - 10 reps  ASSESSMENT:  CLINICAL  IMPRESSION: Patient tolerated session well today. Progressed LE strength in standing and began gait training. Patient showing improved standing balance and is able to ambulate 4 inch stairs with HHA x 2 and minimal cueing. Also good return walking with RW with minimal cueing needed. Patient reporting increased muscle fatigue toward end of session. Patient will continue to  benefit from skilled therapy services to reduce remaining deficits and improve functional ability.     OBJECTIVE IMPAIRMENTS Abnormal gait, decreased activity tolerance, decreased balance, decreased endurance, decreased knowledge of condition, decreased knowledge of use of DME, decreased mobility, difficulty walking, decreased ROM, decreased strength, impaired flexibility, improper body mechanics, and prosthetic dependency .   ACTIVITY LIMITATIONS carrying, lifting, bending, standing, squatting, stairs, transfers, and locomotion level  PARTICIPATION LIMITATIONS: driving, shopping, and community activity  PERSONAL FACTORS Time since onset of injury/illness/exacerbation and Transportation are also affecting patient's functional outcome.   REHAB POTENTIAL: Excellent  CLINICAL DECISION MAKING: Stable/uncomplicated  EVALUATION COMPLEXITY: Low   GOALS: Goals reviewed with patient? Yes  SHORT TERM GOALS: Target date: 09/03/22  Patient will be independent with initial HEP and self-management strategies to improve functional outcomes Baseline: Initiated Goal status: IN PROGRESS    2. Patient will improve 30 second chair stand test to 12 stands in 30 seconds to show significant improvement in functional strength and transfers Baseline: 10x in 30 seconds Goal status: IN progress   LONG TERM GOALS: Target date: 10/01/22  Patient will be independent with advanced HEP and self-management strategies to improve functional outcomes Baseline: n/a Goal status: IN PROGRESS  2.  Patient will improve LEFS score by 9 points to  indicate improvement in functional outcomes Baseline: 24/80 Goal status: IN PROGRESS  3.  Patient will have X knee AROM 0-120 degrees to improve functional mobility and facilitate squatting to pick up items from floor. Baseline: 0-14-107 Goal status: IN PROGRESS  4. Patient will have equal to or > 4+/5 MMT throughout 5/5 to improve ability to perform functional mobility, stair ambulation and ADLs.  Baseline: see above Goal status: IN PROGRESS  5. Patient will be able to ambulate =/>5 minutes with LRAD to increase participation in family and community activities out of the house. Baseline: Assess next visit Goal status: IN PROGRESS   6. Patient will improve TUG to <20 seconds with LRAD and prosthetic to show significant improvement and independence with transfers and ambulation. Baseline: 31.5 seconds with RW Goal status: IN PROGRESS   PLAN: PT FREQUENCY: 2x/week  PT DURATION: 8 weeks  PLANNED INTERVENTIONS: Therapeutic exercises, Therapeutic activity, Neuromuscular re-education, Balance training, Gait training, Patient/Family education, Self Care, Joint mobilization, Stair training, Orthotic/Fit training, Prosthetic training, DME instructions, Dry Needling, Electrical stimulation, Wheelchair mobility training, Cryotherapy, Moist heat, scar mobilization, Taping, Ultrasound, Biofeedback, Ionotophoresis 4mg /ml Dexamethasone, Manual therapy, and Re-evaluation  PLAN FOR NEXT SESSION:  Prosthetic education, gait training parallel bars>RW>cane as able to progress. LE strength and ROM (Rt knee with fair contracture.)  11:55 AM, 09/03/22 Josue Hector PT DPT  Physical Therapist with [facility]  [phone]

## 2022-09-04 ENCOUNTER — Ambulatory Visit (INDEPENDENT_AMBULATORY_CARE_PROVIDER_SITE_OTHER): Payer: 59 | Admitting: Orthopedic Surgery

## 2022-09-04 DIAGNOSIS — Z89511 Acquired absence of right leg below knee: Secondary | ICD-10-CM | POA: Diagnosis not present

## 2022-09-06 ENCOUNTER — Encounter (HOSPITAL_COMMUNITY): Payer: Medicare Other | Admitting: Physical Therapy

## 2022-09-07 ENCOUNTER — Encounter: Payer: Self-pay | Admitting: Orthopedic Surgery

## 2022-09-07 NOTE — Progress Notes (Signed)
Office Visit Note   Patient: Kerry Randall           Date of Birth: [birth year]           MRN: [id number] Visit Date: 09/04/2022              Requested by: [facility], [place],  [place] [address] PCP: [facility], [place]  Chief Complaint  Patient presents with   Right Leg - Follow-up    04/04/2022 right BKA with Kerecis       HPI: Patient is 5 months status post right transtibial amputation with tissue reinforcement with Kerecis.  Patient is currently working with physical therapy for gait training she is using a stump shrinker.  She has a follow-up appointment with [facility].  Patient picked up her prosthesis a month ago and is getting set up with physical therapy at [facility].  Assessment & Plan: Visit Diagnoses:  1. History of right below knee amputation ([place])     Plan: We will follow-up in 4 weeks plan for 2 view radiographs of the right below-knee amputation.  Follow-Up Instructions: Return in about 4 weeks (around 10/02/2022).   Ortho Exam  Patient is alert, oriented, no adenopathy, well-dressed, normal affect, normal respiratory effort. Examination the residual limb is well-healed there is no cellulitis no swelling no drainage no signs of infection.  Imaging: No results found. No images are attached to the encounter.  Labs: Lab Results  Component Value Date   HGBA1C 7.0 (H) 03/30/2022   ESRSEDRATE 94 (H) 03/31/2022   ESRSEDRATE 85 (H) 03/30/2022   ESRSEDRATE 72 (H) 03/29/2022   CRP 16.2 (H) 03/31/2022   CRP 18.0 (H) 03/30/2022   CRP 17.7 (H) 03/29/2022   REPTSTATUS 04/03/2022 FINAL 03/29/2022   CULT  03/29/2022    NO GROWTH 5 DAYS Performed at [facility], [place] [address], [place], [place] [address]      Lab Results  Component Value Date   ALBUMIN 2.2 (L) 04/06/2022   ALBUMIN 2.2 (L) 04/05/2022   ALBUMIN 2.1 (L) 04/04/2022   PREALBUMIN 13.5 (L) 03/29/2022    Lab Results  Component Value Date   MG 1.7  04/06/2022   MG 1.8 04/05/2022   MG 1.6 (L) 04/04/2022   No results found for: "VD25OH"  Lab Results  Component Value Date   PREALBUMIN 13.5 (L) 03/29/2022      Latest Ref Rng & Units 04/06/2022    1:58 AM 04/05/2022    1:29 AM 04/04/2022    2:44 AM  CBC EXTENDED  WBC 4.0 - 10.5 K/uL 8.6  9.4  9.7   RBC 3.87 - 5.11 MIL/uL 3.39  3.50  3.33   Hemoglobin 12.0 - 15.0 g/dL 9.9  10.0  9.6   HCT 36.0 - 46.0 % 30.7  32.0  30.0   Platelets 150 - 400 K/uL 168  201  172   NEUT# 1.7 - 7.7 K/uL  6.2  5.8   Lymph# 0.7 - 4.0 K/uL  2.0  2.7      There is no height or weight on file to calculate BMI.  Orders:  No orders of the defined types were placed in this encounter.  No orders of the defined types were placed in this encounter.    Procedures: No procedures performed  Clinical Data: No additional findings.  ROS:  All other systems negative, except as noted in the HPI. Review of Systems  Objective: Vital Signs: There were  no vitals taken for this visit.  Specialty Comments:  No specialty comments available.  PMFS History: Patient Active Problem List   Diagnosis Date Noted   Severe protein-calorie malnutrition ([place])    Diabetic infection of right foot ([place]) 03/29/2022   Stage 3a chronic kidney disease (CKD) ([place]) 03/29/2022   Atrial fibrillation ([place])    Insulin-requiring or dependent type II diabetes mellitus ([place])    PAD (peripheral artery disease) (HCC)    Major depressive disorder, recurrent (HCC)    CAD (coronary artery disease)    Gangrene of right foot ([place])    Critical lower limb ischemia ([place]) 04/26/2021   Past Medical History:  Diagnosis Date   Atrial fibrillation (HCC)    Colon polyps    Diabetes mellitus without complication (HCC)    GERD (gastroesophageal reflux disease)    Heart disease, hyperkinetic    Hypercholesteremia    Hypertension    Kidney disease    Leg pain    Major depressive disorder, recurrent ([place])    Palpitations    Vitamin D  deficiency     Family History  Problem Relation Age of Onset   Hypertension Mother    Diabetes type II Mother    Heart disease Mother    Throat cancer Father    Hypertension Sister    Depression Sister    Brain cancer Sister    Hypertension Maternal Aunt    Hyperlipidemia Maternal Aunt    Hypertension Maternal Uncle    Hyperlipidemia Maternal Uncle    Hypertension Paternal Aunt    Hyperlipidemia Paternal Aunt    Heart disease Paternal Aunt    Hypertension Paternal Uncle    Hyperlipidemia Paternal Uncle    Diabetes type II Daughter    Depression Daughter    Anxiety disorder Daughter    Lupus Paternal Grandmother     Past Surgical History:  Procedure Laterality Date   ABDOMINAL AORTOGRAM W/LOWER EXTREMITY N/A 04/26/2021   Procedure: ABDOMINAL AORTOGRAM W/LOWER EXTREMITY;  Surgeon: Wellington Hampshire, MD;  Location: [place] [facility];  Service: Cardiovascular;  Laterality: N/A;   ABDOMINAL AORTOGRAM W/LOWER EXTREMITY Right 04/02/2022   Procedure: ABDOMINAL AORTOGRAM W/LOWER EXTREMITY;  Surgeon: Cherre Robins, MD;  Location: [place] [facility];  Service: Vascular;  Laterality: Right;   AMPUTATION Right 04/04/2022   Procedure: RIGHT BELOW KNEE AMPUTATION;  Surgeon: Newt Minion, MD;  Location: [place];  Service: Orthopedics;  Laterality: Right;   AMPUTATION TOE Bilateral    small toe on left foot; all toes on right foot   PERIPHERAL VASCULAR BALLOON ANGIOPLASTY Right 04/26/2021   Procedure: PERIPHERAL VASCULAR BALLOON ANGIOPLASTY;  Surgeon: Wellington Hampshire, MD;  Location: [place] [facility];  Service: Cardiovascular;  Laterality: Right;  superficial femoral   PERIPHERAL VASCULAR INTERVENTION Right 04/26/2021   Procedure: PERIPHERAL VASCULAR INTERVENTION;  Surgeon: Wellington Hampshire, MD;  Location: [place] [facility];  Service: Cardiovascular;  Laterality: Right;  Iliac stent   PERIPHERAL VASCULAR INTERVENTION Bilateral 04/02/2022   Procedure: PERIPHERAL VASCULAR INTERVENTION;  Surgeon:  Cherre Robins, MD;  Location: [place] [facility];  Service: Vascular;  Laterality: Bilateral;  common iliac   TOTAL VAGINAL HYSTERECTOMY  12/1998   Social History   Occupational History   Not on file  Tobacco Use   Smoking status: Smoker, Current Status Unknown   Smokeless tobacco: Never  Substance and Sexual Activity   Alcohol use: Not on file   Drug use: Not on file   Sexual  activity: Not on file

## 2022-09-11 ENCOUNTER — Encounter (HOSPITAL_COMMUNITY): Payer: Medicare Other | Admitting: Physical Therapy

## 2022-09-13 ENCOUNTER — Encounter (HOSPITAL_COMMUNITY): Payer: Medicare Other | Admitting: Physical Therapy

## 2022-09-18 ENCOUNTER — Ambulatory Visit (HOSPITAL_COMMUNITY): Payer: 59 | Admitting: Physical Therapy

## 2022-09-18 ENCOUNTER — Encounter (HOSPITAL_COMMUNITY): Payer: Self-pay | Admitting: Physical Therapy

## 2022-09-18 DIAGNOSIS — R262 Difficulty in walking, not elsewhere classified: Secondary | ICD-10-CM

## 2022-09-18 DIAGNOSIS — M6281 Muscle weakness (generalized): Secondary | ICD-10-CM

## 2022-09-18 DIAGNOSIS — R2689 Other abnormalities of gait and mobility: Secondary | ICD-10-CM

## 2022-09-18 NOTE — Therapy (Signed)
OUTPATIENT PHYSICAL THERAPY PROSTHETICS TREATMENT   Patient Name: Kerry Randall MRN: [id number] DOB:November 19, 1961, 61 y.o., female Today's Date: 09/18/2022  PCP: Dr. Brett Albino - [facility] REFERRING PROVIDER: Meridee Score, MD   PT End of Session - 09/18/22 [phone]     Visit Number 6    Number of Visits 16    Date for PT Re-Evaluation 10/01/22    Authorization Type UHC Medicare ([employer] [place])    Authorization Time Period No Auth, No visit limit    Progress Note Due on Visit 10    PT Start Time 0815    PT Stop Time 0855    PT Time Calculation (min) 40 min    Equipment Utilized During Treatment Gait belt    Activity Tolerance Patient tolerated treatment well    Behavior During Therapy WFL for tasks assessed/performed             Past Medical History:  Diagnosis Date   Atrial fibrillation ([place])    Colon polyps    Diabetes mellitus without complication ([place])    GERD (gastroesophageal reflux disease)    Heart disease, hyperkinetic    Hypercholesteremia    Hypertension    Kidney disease    Leg pain    Major depressive disorder, recurrent ([place])    Palpitations    Vitamin D deficiency    Past Surgical History:  Procedure Laterality Date   ABDOMINAL AORTOGRAM W/LOWER EXTREMITY N/A 04/26/2021   Procedure: ABDOMINAL AORTOGRAM W/LOWER EXTREMITY;  Surgeon: Wellington Hampshire, MD;  Location: [facility];  Service: Cardiovascular;  Laterality: N/A;   ABDOMINAL AORTOGRAM W/LOWER EXTREMITY Right 04/02/2022   Procedure: ABDOMINAL AORTOGRAM W/LOWER EXTREMITY;  Surgeon: Cherre Robins, MD;  Location: [facility];  Service: Vascular;  Laterality: Right;   AMPUTATION Right 04/04/2022   Procedure: RIGHT BELOW KNEE AMPUTATION;  Surgeon: Newt Minion, MD;  Location: [place];  Service: Orthopedics;  Laterality: Right;   AMPUTATION TOE Bilateral    small toe on left foot; all toes on right foot   PERIPHERAL VASCULAR BALLOON ANGIOPLASTY Right 04/26/2021   Procedure:  PERIPHERAL VASCULAR BALLOON ANGIOPLASTY;  Surgeon: Wellington Hampshire, MD;  Location: [facility];  Service: Cardiovascular;  Laterality: Right;  superficial femoral   PERIPHERAL VASCULAR INTERVENTION Right 04/26/2021   Procedure: PERIPHERAL VASCULAR INTERVENTION;  Surgeon: Wellington Hampshire, MD;  Location: [facility];  Service: Cardiovascular;  Laterality: Right;  Iliac stent   PERIPHERAL VASCULAR INTERVENTION Bilateral 04/02/2022   Procedure: PERIPHERAL VASCULAR INTERVENTION;  Surgeon: Cherre Robins, MD;  Location: [facility] [place] [facility];  Service: Vascular;  Laterality: Bilateral;  common iliac   TOTAL VAGINAL HYSTERECTOMY  12/1998   Patient Active Problem List   Diagnosis Date Noted   Severe protein-calorie malnutrition ([place])    Diabetic infection of right foot ([place]) 03/29/2022   Stage 3a chronic kidney disease (CKD) ([place]) 03/29/2022   Atrial fibrillation ([facility])    Insulin-requiring or dependent type II diabetes mellitus ([facility])    PAD (peripheral artery disease) ([facility])    Major depressive disorder, recurrent ([facility])    CAD (coronary artery disease)    Gangrene of right foot ([place])    Critical lower limb ischemia ([place]) 04/26/2021    ONSET DATE: 04/04/22 Rt BKA  REFERRING DIAG: Z89.511 (ICD-10-CM) - History of right below knee amputation   THERAPY DIAG:  Difficulty in walking, not elsewhere classified  Muscle weakness (generalized)  Other abnormalities of gait and mobility  Rationale for Evaluation and Treatment Rehabilitation  SUBJECTIVE:   SUBJECTIVE STATEMENT: Doing ok. Not doing as much at home, she has been feeling a little down lately.   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 After hospitalization, she underwent rehab at a SNF for about 2 weeks. No HHPT. Reports she has not been exercising. Received RLE prosthesis last Monday from [facility]. She reports she knows very little about the prosthesis, but was able to walk  in // bars a few times while having it fitted. She has been instructed to wear 2 hours each day AM/PM and has been working up to this. It has been sore adjusting to wearing this. [facility].  Pt accompanied by: self Has been receiving transportation services.  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 No  PRECAUTIONS: None  WEIGHT BEARING RESTRICTIONS No  FALLS: Has patient fallen in last 6 months? Yes. Number of falls 6  LIVING ENVIRONMENT: Lives with: lives alone Lives in: House/apartment Home Access: Level entry Home layout: One level Stairs: No (there is one ramp to get to apartment - 37ft) Has following equipment at home: Single point cane, Walker - 2 wheeled, and Wheelchair (manual)  PLOF: Independent  PATIENT GOALS   Patient would like to be fully independent, walking again. Pick up grandchildren, shop, participate in family and community activities without the wheelchair.  OBJECTIVE:  COGNITION: Overall cognitive status: Within functional limits for tasks assessed   SENSATION: [facility] She does report phantom pains. MUSCLE LENGTH: Hamstrings: Right 35 deg from full ext;  Thomas test: Right 17 deg past extension;   LOWER EXTREMITY ROM:     Active  Right eval Left eval  Hip flexion    Hip extension    Hip abduction    Hip adduction    Hip internal rotation    Hip external rotation    Knee flexion 107 122  Knee extension -14 0  Ankle dorsiflexion    Ankle plantarflexion    Ankle inversion    Ankle eversion     (Blank rows = not tested)    LOWER EXTREMITY MMT:  MMT Right eval Left eval  Hip flexion 4+ 4+  Hip extension 4 4+  Hip abduction 4 5  Hip adduction 4+ 4+  Hip internal rotation    Hip external rotation    Knee flexion 5 5  Knee extension 4+ 5  Ankle dorsiflexion    Ankle plantarflexion    Ankle inversion    Ankle eversion    (Blank rows = not tested)   TRANSFERS: Squat  pivot transfer: Complete Independence (without AD)  RAMP Complete Independence  GAIT: Gait pattern: step to pattern, step through pattern, decreased stance time- Right, decreased stride length, Right steppage, Right foot flat, knee flexed in stance- Right, antalgic, and decreased trunk rotation Distance walked: 20' Assistive device utilized: Environmental consultant - 2 wheeled Level of assistance: CGA Comments: Demonstrates good alignment with prosthesis; Rt knee is flexed throughout gait cycle with some mild buckling noted and antalgic patterning.  FUNCTIONAL TESTs:  30 seconds chair stand test 10 times in 30 seconds, reliant on arm rests and Lt foot offset Timed up and go (TUG): 31.5 with RW  CURRENT PROSTHETIC WEAR ASSESSMENT: Patient is independent with: residual limb care, care of non-amputated limb, prosthetic cleaning, and proper wear schedule/adjustment Patient is dependent with: correct ply sock  adjustment Donning prosthesis: Complete Independence Doffing prosthesis: Modified independence Prosthetic wear tolerance: 2 hours/day, 7 days/week Prosthetic weight bearing tolerance: Not tested Edema: not tested Residual limb condition: scars healed, 2 areas of skin abrasion without overt redness distal residual tiba, and proximal to knee approx 10 cm 2 areas  PATIENT SURVEYS:  LEFS 24/80 = 30%  TODAY'S TREATMENT:  09/18/22 Seated: Shoulder row GTB x20 Shoulder extension GTB x20 Shoulder ER bilat GTB x20 LAQ 3# 2 x 10 each  Gait in TM 5 min 78mph HHA x 2   Standing hip abduction x20  Standing hip extension x20    09/03/22 Seated:  Rows GTB x20    Shoulder extension GTB x20    LAQ 10x5"   Sit to stand x10 with no UE  Standing:   Step taps 4 inch x 20 HHA x1 Step ups 4 inch 2 x 10 each HHA x 1 Hip abduction RLE 2 x 10 Stairs 4 inch 3 RT HHA x 2 reciprocal Clinic amb 50' and 50' using RW   Nustep lv 2 5 min EOS for LE strength and ROM  (seat 7)     08/30/22 Standing hip abduction  GTB at knees 2x 10 bilateral  Standing hip extension GTB at knees 2x 10 bilateral  Step up 6 inch 2x 10 bilateral  LAQ 10 x 5 second holds bilateral  Row 2x 15 GTB Shoulder extension GTB 2x 15 Stairs 4 inch alternating pattern; 1 RT bilateral UE support, 4 RT unilateral UE assist  08/28/22 Standing:  in //bars with bil UE assist  Hip abduction 10X2 each  Hip extension 10X2 each ` Alternating march 10X2 each  4" step up 15X with bil UE, 10X with 1 UE, 5X without UE  Stair negotiation 4" side reciprocally 1RT with bil HR, 1RT with 1 HR (step to descending)  Walking with RW in dept 150 feet Seated: sit to stand no UE from standard chair 10X  Doffing of prosthesis, residual limb inspection  08/09/22 Standing:  in //bars with bil UE assist walking in //bars 2RT for warmup  HIp abduction 10X each  HIp extension 10X each ` Alternating march 10X each  Walking with RW Seated:  LAQ 10X each  Sit to stands from elevated surface no UE 10X (keeping feet even) Supine:  Bridge 2X10 Prone: knee extension 2 minutes  Hip extension 2X10  TREATMENT:  PATIENT EDUCATED ON FOLLOWING PROSTHETIC CARE: Prosthetic wear tolerance: 2 hours/ 2x/day, 7 days/week Prosthetic weight bearing tolerance: Reassess during future visits Other education  Skin check, Residual limb care, Correct ply sock adjustment, Proper doffing, and Proper wear schedule/adjustment Low load long duration stretching of Rt knee to increase extension.   PATIENT EDUCATION: Education details:08/30/22: prone positioning, HEP; EVAL: Findings, POC, knee extension ROM, skin checks, ply socks (use not needed yet.) Person educated: Patient Education method: Explanation Education comprehension: verbalized understanding   HOME EXERCISE PROGRAM: Access Code: [email or web address]  09/03/22 - Sit to Stand with Counter Support  - 2 x daily - 7 x weekly - 1-2 sets - 10 reps  Date: 08/30/2022 - Standing Hip Abduction with Resistance at Ankles and  Counter Support  - 2 x daily - 7 x weekly - 2 sets - 10 reps - Standing Hip Extension with Resistance at Ankles and Counter Support  - 2 x daily - 7 x weekly - 2 sets - 10 reps  ASSESSMENT:  CLINICAL IMPRESSION: Tolerated session well. Progressed gait with added TM walking. Patient cued  on ROM with hip extension. Slight fatigue noted during session but able to complete with minimal rest breaks. Patient will continue to benefit from skilled therapy services to reduce remaining deficits and improve functional ability.    OBJECTIVE IMPAIRMENTS Abnormal gait, decreased activity tolerance, decreased balance, decreased endurance, decreased knowledge of condition, decreased knowledge of use of DME, decreased mobility, difficulty walking, decreased ROM, decreased strength, impaired flexibility, improper body mechanics, and prosthetic dependency .   ACTIVITY LIMITATIONS carrying, lifting, bending, standing, squatting, stairs, transfers, and locomotion level  PARTICIPATION LIMITATIONS: driving, shopping, and community activity  PERSONAL FACTORS Time since onset of injury/illness/exacerbation and Transportation are also affecting patient's functional outcome.   REHAB POTENTIAL: Excellent  CLINICAL DECISION MAKING: Stable/uncomplicated  EVALUATION COMPLEXITY: Low   GOALS: Goals reviewed with patient? Yes  SHORT TERM GOALS: Target date: 09/03/22  Patient will be independent with initial HEP and self-management strategies to improve functional outcomes Baseline: Initiated Goal status: IN PROGRESS    2. Patient will improve 30 second chair stand test to 12 stands in 30 seconds to show significant improvement in functional strength and transfers Baseline: 10x in 30 seconds Goal status: IN progress   LONG TERM GOALS: Target date: 10/01/22  Patient will be independent with advanced HEP and self-management strategies to improve functional outcomes Baseline: n/a Goal status: IN PROGRESS  2.   Patient will improve LEFS score by 9 points to indicate improvement in functional outcomes Baseline: 24/80 Goal status: IN PROGRESS  3.  Patient will have X knee AROM 0-120 degrees to improve functional mobility and facilitate squatting to pick up items from floor. Baseline: 0-14-107 Goal status: IN PROGRESS  4. Patient will have equal to or > 4+/5 MMT throughout 5/5 to improve ability to perform functional mobility, stair ambulation and ADLs.  Baseline: see above Goal status: IN PROGRESS  5. Patient will be able to ambulate =/>5 minutes with LRAD to increase participation in family and community activities out of the house. Baseline: Assess next visit Goal status: IN PROGRESS   6. Patient will improve TUG to <20 seconds with LRAD and prosthetic to show significant improvement and independence with transfers and ambulation. Baseline: 31.5 seconds with RW Goal status: IN PROGRESS   PLAN: PT FREQUENCY: 2x/week  PT DURATION: 8 weeks  PLANNED INTERVENTIONS: Therapeutic exercises, Therapeutic activity, Neuromuscular re-education, Balance training, Gait training, Patient/Family education, Self Care, Joint mobilization, Stair training, Orthotic/Fit training, Prosthetic training, DME instructions, Dry Needling, Electrical stimulation, Wheelchair mobility training, Cryotherapy, Moist heat, scar mobilization, Taping, Ultrasound, Biofeedback, Ionotophoresis 4mg /ml Dexamethasone, Manual therapy, and Re-evaluation  PLAN FOR NEXT SESSION:  try leg press. Prosthetic education, gait training parallel bars>RW>cane as able to progress. LE strength and ROM (Rt knee with fair contracture.)  8:12 AM, 09/18/22 Josue Hector PT DPT  Physical Therapist with [facility]  [phone]

## 2022-09-20 ENCOUNTER — Ambulatory Visit (HOSPITAL_COMMUNITY): Payer: 59 | Admitting: Physical Therapy

## 2022-09-20 DIAGNOSIS — R2689 Other abnormalities of gait and mobility: Secondary | ICD-10-CM

## 2022-09-20 DIAGNOSIS — R262 Difficulty in walking, not elsewhere classified: Secondary | ICD-10-CM

## 2022-09-20 DIAGNOSIS — M6281 Muscle weakness (generalized): Secondary | ICD-10-CM

## 2022-09-20 DIAGNOSIS — Z89511 Acquired absence of right leg below knee: Secondary | ICD-10-CM

## 2022-09-20 NOTE — Therapy (Signed)
OUTPATIENT PHYSICAL THERAPY PROSTHETICS TREATMENT   Patient Name: Kerry Randall MRN: [id number] DOB:[birth year], 61 y.o., female Today's Date: 09/20/2022  PCP: Dr. Brett Albino - [facility] REFERRING PROVIDER: Meridee Score, MD   PT End of Session - 09/20/22 [phone]     Number of Visits 16    Date for PT Re-Evaluation 10/01/22    Authorization Type UHC Medicare ([place])    Authorization Time Period No Auth, No visit limit    Progress Note Due on Visit 10    Equipment Utilized During Treatment Gait belt    Activity Tolerance Patient tolerated treatment well    Behavior During Therapy WFL for tasks assessed/performed             Past Medical History:  Diagnosis Date   Atrial fibrillation ([place])    Colon polyps    Diabetes mellitus without complication ([place])    GERD (gastroesophageal reflux disease)    Heart disease, hyperkinetic    Hypercholesteremia    Hypertension    Kidney disease    Leg pain    Major depressive disorder, recurrent ([place])    Palpitations    Vitamin D deficiency    Past Surgical History:  Procedure Laterality Date   ABDOMINAL AORTOGRAM W/LOWER EXTREMITY N/A 04/26/2021   Procedure: ABDOMINAL AORTOGRAM W/LOWER EXTREMITY;  Surgeon: Wellington Hampshire, MD;  Location: [facility];  Service: Cardiovascular;  Laterality: N/A;   ABDOMINAL AORTOGRAM W/LOWER EXTREMITY Right 04/02/2022   Procedure: ABDOMINAL AORTOGRAM W/LOWER EXTREMITY;  Surgeon: Cherre Robins, MD;  Location: [facility];  Service: Vascular;  Laterality: Right;   AMPUTATION Right 04/04/2022   Procedure: RIGHT BELOW KNEE AMPUTATION;  Surgeon: Newt Minion, MD;  Location: [facility];  Service: Orthopedics;  Laterality: Right;   AMPUTATION TOE Bilateral    small toe on left foot; all toes on right foot   PERIPHERAL VASCULAR BALLOON ANGIOPLASTY Right 04/26/2021   Procedure: PERIPHERAL VASCULAR BALLOON ANGIOPLASTY;  Surgeon: Wellington Hampshire, MD;  Location: [facility];   Service: Cardiovascular;  Laterality: Right;  superficial femoral   PERIPHERAL VASCULAR INTERVENTION Right 04/26/2021   Procedure: PERIPHERAL VASCULAR INTERVENTION;  Surgeon: Wellington Hampshire, MD;  Location: [facility];  Service: Cardiovascular;  Laterality: Right;  Iliac stent   PERIPHERAL VASCULAR INTERVENTION Bilateral 04/02/2022   Procedure: PERIPHERAL VASCULAR INTERVENTION;  Surgeon: Cherre Robins, MD;  Location: [facility];  Service: Vascular;  Laterality: Bilateral;  common iliac   TOTAL VAGINAL HYSTERECTOMY  12/1998   Patient Active Problem List   Diagnosis Date Noted   Severe protein-calorie malnutrition ([place])    Diabetic infection of right foot ([place]) 03/29/2022   Stage 3a chronic kidney disease (CKD) ([place]) 03/29/2022   Atrial fibrillation ([facility])    Insulin-requiring or dependent type II diabetes mellitus (H[facility])    PAD (peripheral artery disease) (H[facility])    Major depressive disorder, recurrent ([facility])    CAD (coronary artery disease)    Gangrene of right foot ([place])    Critical lower limb ischemia ([place]) 04/26/2021    ONSET DATE: 04/04/22 Rt BKA  REFERRING DIAG: Z89.511 (ICD-10-CM) - History of right below knee amputation   THERAPY DIAG:  Difficulty in walking, not elsewhere classified  Muscle weakness (generalized)  Other abnormalities of gait and mobility  Hx of right BKA (HCC)  Rationale for Evaluation and Treatment Rehabilitation  SUBJECTIVE:   SUBJECTIVE STATEMENT: Pt states she was able to roll to [employer]  after her appt Tuesday and walked around when she was inside.  Reports it was first "outing" since the BKA.  States she had pain at her ankle and felt her toes are bent after donning her prosthesis.  No other issues or signs of breakdown or wounds. Admits to not donning her leg much at home but is starting to do this more and walk.   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 After hospitalization, she  underwent rehab at a SNF for about 2 weeks. No HHPT. Reports she has not been exercising. Received RLE prosthesis last Monday from [facility]. She reports she knows very little about the prosthesis, but was able to walk in // bars a few times while having it fitted. She has been instructed to wear 2 hours each day AM/PM and has been working up to this. It has been sore adjusting to wearing this. [facility].  Pt accompanied by: self Has been receiving transportation services.  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 No  PRECAUTIONS: None  WEIGHT BEARING RESTRICTIONS No  FALLS: Has patient fallen in last 6 months? Yes. Number of falls 6  LIVING ENVIRONMENT: Lives with: lives alone Lives in: House/apartment Home Access: Level entry Home layout: One level Stairs: No (there is one ramp to get to apartment - 10ft) Has following equipment at home: Single point cane, Walker - 2 wheeled, and Wheelchair (manual)  PLOF: Independent  PATIENT GOALS   Patient would like to be fully independent, walking again. Pick up grandchildren, shop, participate in family and community activities without the wheelchair.  OBJECTIVE:  COGNITION: Overall cognitive status: Within functional limits for tasks assessed   SENSATION: [facility] She does report phantom pains. MUSCLE LENGTH: Hamstrings: Right 35 deg from full ext;  Thomas test: Right 17 deg past extension;   LOWER EXTREMITY ROM:     Active  Right eval Left eval  Hip flexion    Hip extension    Hip abduction    Hip adduction    Hip internal rotation    Hip external rotation    Knee flexion 107 122  Knee extension -14 0  Ankle dorsiflexion    Ankle plantarflexion    Ankle inversion    Ankle eversion     (Blank rows = not tested)    LOWER EXTREMITY MMT:  MMT Right eval Left eval  Hip flexion 4+ 4+  Hip extension 4 4+  Hip abduction 4 5  Hip adduction 4+ 4+  Hip  internal rotation    Hip external rotation    Knee flexion 5 5  Knee extension 4+ 5  Ankle dorsiflexion    Ankle plantarflexion    Ankle inversion    Ankle eversion    (Blank rows = not tested)   TRANSFERS: Squat pivot transfer: Complete Independence (without AD)  RAMP Complete Independence  GAIT: Gait pattern: step to pattern, step through pattern, decreased stance time- Right, decreased stride length, Right steppage, Right foot flat, knee flexed in stance- Right, antalgic, and decreased trunk rotation Distance walked: 20' Assistive device utilized: Environmental consultant - 2 wheeled Level of assistance: CGA Comments: Demonstrates good alignment with prosthesis; Rt knee is flexed throughout gait cycle with some mild buckling noted and antalgic patterning.  FUNCTIONAL TESTs:  30 seconds chair stand test 10 times in 30 seconds, reliant on arm rests and Lt foot  offset Timed up and go (TUG): 31.5 with RW  CURRENT PROSTHETIC WEAR ASSESSMENT: Patient is independent with: residual limb care, care of non-amputated limb, prosthetic cleaning, and proper wear schedule/adjustment Patient is dependent with: correct ply sock adjustment Donning prosthesis: Complete Independence Doffing prosthesis: Modified independence Prosthetic wear tolerance: 2 hours/day, 7 days/week Prosthetic weight bearing tolerance: Not tested Edema: not tested Residual limb condition: scars healed, 2 areas of skin abrasion without overt redness distal residual tiba, and proximal to knee approx 10 cm 2 areas  PATIENT SURVEYS:  LEFS 24/80 = 30%  TODAY'S TREATMENT:  09/20/22 Standing:  Standing hip abduction x20  hip extension x20 Alternating toe taps 4" with 1 HHA Shallow squats 10X Side stepping in hallway no AD or UE 2RT (12' distance) Gait 200 feet with RW Standing GTB rows 2X10 with CGA  Extensions GTB 2X10 with CGA    09/18/22 Seated: Shoulder row GTB x20 Shoulder extension GTB x20 Shoulder ER bilat GTB  x20 LAQ 3# 2 x 10 each  Gait in TM 5 min HHA x 2   Standing hip abduction x20  Standing hip extension x20   09/03/22 Seated:  Rows GTB x20    Shoulder extension GTB x20    LAQ 10x5"   Sit to stand x10 with no UE  Standing:   Step taps 4 inch x 20 HHA x1 Step ups 4 inch 2 x 10 each HHA x 1 Hip abduction RLE 2 x 10 Stairs 4 inch 3 RT HHA x 2 reciprocal Clinic amb 50' and 50' using RW   Nustep lv 2 5 min EOS for LE strength and ROM  (seat 7)     08/30/22 Standing hip abduction GTB at knees 2x 10 bilateral  Standing hip extension GTB at knees 2x 10 bilateral  Step up 6 inch 2x 10 bilateral  LAQ 10 x 5 second holds bilateral  Row 2x 15 GTB Shoulder extension GTB 2x 15 Stairs 4 inch alternating pattern; 1 RT bilateral UE support, 4 RT unilateral UE assist  08/28/22 Standing:  in //bars with bil UE assist  Hip abduction 10X2 each  Hip extension 10X2 each ` Alternating march 10X2 each  4" step up 15X with bil UE, 10X with 1 UE, 5X without UE  Stair negotiation 4" side reciprocally 1RT with bil HR, 1RT with 1 HR (step to descending)  Walking with RW in dept 150 feet Seated: sit to stand no UE from standard chair 10X  Doffing of prosthesis, residual limb inspection  08/09/22 Standing:  in //bars with bil UE assist walking in //bars 2RT for warmup  HIp abduction 10X each  HIp extension 10X each ` Alternating march 10X each  Walking with RW Seated:  LAQ 10X each  Sit to stands from elevated surface no UE 10X (keeping feet even) Supine:  Bridge 2X10 Prone: knee extension 2 minutes  Hip extension 2X10  TREATMENT:  PATIENT EDUCATED ON FOLLOWING PROSTHETIC CARE: Prosthetic wear tolerance: 2 hours/ 2x/day, 7 days/week Prosthetic weight bearing tolerance: Reassess during future visits Other education  Skin check, Residual limb care, Correct ply sock adjustment, Proper doffing, and Proper wear schedule/adjustment Low load long duration stretching of Rt knee to increase  extension.   PATIENT EDUCATION: Education details:08/30/22: prone positioning, HEP; EVAL: Findings, POC, knee extension ROM, skin checks, ply socks (use not needed yet.) Person educated: Patient Education method: Explanation Education comprehension: verbalized understanding   HOME EXERCISE PROGRAM: Access Code: [email or web address]  09/03/22 - Sit to Stand  with Counter Support  - 2 x daily - 7 x weekly - 1-2 sets - 10 reps  Date: 08/30/2022 - Standing Hip Abduction with Resistance at Ankles and Counter Support  - 2 x daily - 7 x weekly - 2 sets - 10 reps - Standing Hip Extension with Resistance at Ankles and Counter Support  - 2 x daily - 7 x weekly - 2 sets - 10 reps  ASSESSMENT:  CLINICAL IMPRESSION: Pt progressing extremely well with therapy.  Pt is now walking behind her wheelchair up to office, walking more short distances without AD as well.  Pt without any falls or breakdown on residual limb from prosthesis.  Progressed to dynamic balance challenge without AD working on sidestepping and added UE thera band for challenge.  Therapist maintained CGA with activity, however no LOB or near falls.  Pt required several seated rest breaks during session, however improved tolerance noted.  Patient will continue to benefit from skilled therapy services to reduce remaining deficits and improve functional ability.    OBJECTIVE IMPAIRMENTS Abnormal gait, decreased activity tolerance, decreased balance, decreased endurance, decreased knowledge of condition, decreased knowledge of use of DME, decreased mobility, difficulty walking, decreased ROM, decreased strength, impaired flexibility, improper body mechanics, and prosthetic dependency .   ACTIVITY LIMITATIONS carrying, lifting, bending, standing, squatting, stairs, transfers, and locomotion level  PARTICIPATION LIMITATIONS: driving, shopping, and community activity  PERSONAL FACTORS Time since onset of injury/illness/exacerbation and Transportation are  also affecting patient's functional outcome.   REHAB POTENTIAL: Excellent  CLINICAL DECISION MAKING: Stable/uncomplicated  EVALUATION COMPLEXITY: Low   GOALS: Goals reviewed with patient? Yes  SHORT TERM GOALS: Target date: 09/03/22  Patient will be independent with initial HEP and self-management strategies to improve functional outcomes Baseline: Initiated Goal status: IN PROGRESS    2. Patient will improve 30 second chair stand test to 12 stands in 30 seconds to show significant improvement in functional strength and transfers Baseline: 10x in 30 seconds Goal status: IN progress   LONG TERM GOALS: Target date: 10/01/22  Patient will be independent with advanced HEP and self-management strategies to improve functional outcomes Baseline: n/a Goal status: IN PROGRESS  2.  Patient will improve LEFS score by 9 points to indicate improvement in functional outcomes Baseline: 24/80 Goal status: IN PROGRESS  3.  Patient will have X knee AROM 0-120 degrees to improve functional mobility and facilitate squatting to pick up items from floor. Baseline: 0-14-107 Goal status: IN PROGRESS  4. Patient will have equal to or > 4+/5 MMT throughout 5/5 to improve ability to perform functional mobility, stair ambulation and ADLs.  Baseline: see above Goal status: IN PROGRESS  5. Patient will be able to ambulate =/>5 minutes with LRAD to increase participation in family and community activities out of the house. Baseline: Assess next visit Goal status: IN PROGRESS   6. Patient will improve TUG to <20 seconds with LRAD and prosthetic to show significant improvement and independence with transfers and ambulation. Baseline: 31.5 seconds with RW Goal status: IN PROGRESS   PLAN: PT FREQUENCY: 2x/week  PT DURATION: 8 weeks  PLANNED INTERVENTIONS: Therapeutic exercises, Therapeutic activity, Neuromuscular re-education, Balance training, Gait training, Patient/Family education, Self Care,  Joint mobilization, Stair training, Orthotic/Fit training, Prosthetic training, DME instructions, Dry Needling, Electrical stimulation, Wheelchair mobility training, Cryotherapy, Moist heat, scar mobilization, Taping, Ultrasound, Biofeedback, Ionotophoresis 4mg /ml Dexamethasone, Manual therapy, and Re-evaluation  PLAN FOR NEXT SESSION:  try leg press if going to gym area. Increase dynamic balance challenge,  progress to gait with LRAD.  8:12 AM, 09/20/22 Teena Irani, PTA/CLT [place] Ph: [phone]

## 2022-09-25 ENCOUNTER — Encounter (HOSPITAL_COMMUNITY): Payer: Medicare Other | Admitting: Physical Therapy

## 2022-09-25 ENCOUNTER — Telehealth (HOSPITAL_COMMUNITY): Payer: Self-pay | Admitting: Physical Therapy

## 2022-09-25 NOTE — Telephone Encounter (Signed)
Pt did not show for appt.  Called and spoke to patient who states she missed her transportation this morning.  Plans on being here on Thursday.  Teena Irani, PTA/CLT [place] Ph: [phone]

## 2022-09-27 ENCOUNTER — Ambulatory Visit (HOSPITAL_COMMUNITY): Payer: 59 | Attending: Orthopedic Surgery | Admitting: Physical Therapy

## 2022-09-27 DIAGNOSIS — R262 Difficulty in walking, not elsewhere classified: Secondary | ICD-10-CM | POA: Insufficient documentation

## 2022-09-27 DIAGNOSIS — Z89511 Acquired absence of right leg below knee: Secondary | ICD-10-CM | POA: Diagnosis present

## 2022-09-27 DIAGNOSIS — M6281 Muscle weakness (generalized): Secondary | ICD-10-CM | POA: Diagnosis present

## 2022-09-27 DIAGNOSIS — R2689 Other abnormalities of gait and mobility: Secondary | ICD-10-CM | POA: Diagnosis present

## 2022-09-27 NOTE — Therapy (Signed)
OUTPATIENT PHYSICAL THERAPY PROSTHETICS TREATMENT   Patient Name: Kerry Randall MRN: [id number] DOB:1961-10-09, 61 y.o., female Today's Date: 09/27/2022  Progress Note Reporting Period 08/06/22 to 09/27/22  See note below for Objective Data and Assessment of Progress/Goals.     PCP: Dr. Brett Albino - [facility] REFERRING PROVIDER: Meridee Score, MD   PT End of Session - 09/27/22 [phone]     Visit Number 8    Number of Visits 16    Date for PT Re-Evaluation 10/25/22    Authorization Type [employer] Medicare ([employer] [place] [employer])    Authorization Time Period No Auth, No visit limit    Progress Note Due on Visit 16    PT Start Time 0815    PT Stop Time 0855    PT Time Calculation (min) 40 min    Equipment Utilized During Treatment Gait belt    Activity Tolerance Patient tolerated treatment well    Behavior During Therapy WFL for tasks assessed/performed             Past Medical History:  Diagnosis Date   Atrial fibrillation ([facility])    Colon polyps    Diabetes mellitus without complication ([place])    GERD (gastroesophageal reflux disease)    Heart disease, hyperkinetic    Hypercholesteremia    Hypertension    Kidney disease    Leg pain    Major depressive disorder, recurrent ([place])    Palpitations    Vitamin D deficiency    Past Surgical History:  Procedure Laterality Date   ABDOMINAL AORTOGRAM W/LOWER EXTREMITY N/A 04/26/2021   Procedure: ABDOMINAL AORTOGRAM W/LOWER EXTREMITY;  Surgeon: Wellington Hampshire, MD;  Location: [facility];  Service: Cardiovascular;  Laterality: N/A;   ABDOMINAL AORTOGRAM W/LOWER EXTREMITY Right 04/02/2022   Procedure: ABDOMINAL AORTOGRAM W/LOWER EXTREMITY;  Surgeon: Cherre Robins, MD;  Location: [facility];  Service: Vascular;  Laterality: Right;   AMPUTATION Right 04/04/2022   Procedure: RIGHT BELOW KNEE AMPUTATION;  Surgeon: Newt Minion, MD;  Location: [facility];  Service: Orthopedics;  Laterality: Right;   AMPUTATION TOE Bilateral     small toe on left foot; all toes on right foot   PERIPHERAL VASCULAR BALLOON ANGIOPLASTY Right 04/26/2021   Procedure: PERIPHERAL VASCULAR BALLOON ANGIOPLASTY;  Surgeon: Wellington Hampshire, MD;  Location: [facility];  Service: Cardiovascular;  Laterality: Right;  superficial femoral   PERIPHERAL VASCULAR INTERVENTION Right 04/26/2021   Procedure: PERIPHERAL VASCULAR INTERVENTION;  Surgeon: Wellington Hampshire, MD;  Location: [facility];  Service: Cardiovascular;  Laterality: Right;  Iliac stent   PERIPHERAL VASCULAR INTERVENTION Bilateral 04/02/2022   Procedure: PERIPHERAL VASCULAR INTERVENTION;  Surgeon: Cherre Robins, MD;  Location: [facility];  Service: Vascular;  Laterality: Bilateral;  common iliac   TOTAL VAGINAL HYSTERECTOMY  12/1998   Patient Active Problem List   Diagnosis Date Noted   Severe protein-calorie malnutrition ([place])    Diabetic infection of right foot ([place]) 03/29/2022   Stage 3a chronic kidney disease (CKD) ([place]) 03/29/2022   Atrial fibrillation ([facility])    Insulin-requiring or dependent type II diabetes mellitus ([facility])    PAD (peripheral artery disease) ([facility])    Major depressive disorder, recurrent ([facility])    CAD (coronary artery disease)    Gangrene of right foot ([place])    Critical lower limb ischemia ([place]) 04/26/2021    ONSET DATE: 04/04/22 Rt BKA  REFERRING DIAG: Z89.511 (ICD-10-CM) - History of right below knee amputation  THERAPY DIAG:  Difficulty in walking, not elsewhere classified  Muscle weakness (generalized)  Other abnormalities of gait and mobility  Rationale for Evaluation and Treatment Rehabilitation  SUBJECTIVE:   SUBJECTIVE STATEMENT: Doing somewhat better. She feels she is able to do well in therapy but is not able to do as well on her own. She is not sure why. She still has trouble with transfers and navigating smaller spaces at her house. She is able to walk a little more than before.   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 After hospitalization, she underwent rehab at a SNF for about 2 weeks. No HHPT. Reports she has not been exercising. Received RLE prosthesis last Monday from [facility]. She reports she knows very little about the prosthesis, but was able to walk in // bars a few times while having it fitted. She has been instructed to wear 2 hours each day AM/PM and has been working up to this. It has been sore adjusting to wearing this. [facility]  Pt accompanied by: self Has been receiving transportation services.  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 No  PRECAUTIONS: None  WEIGHT BEARING RESTRICTIONS No  FALLS: Has patient fallen in last 6 months? Yes. Number of falls 6  LIVING ENVIRONMENT: Lives with: lives alone Lives in: House/apartment Home Access: Level entry Home layout: One level Stairs: No (there is one ramp to get to apartment - [address]f) Has following equipment at home: Single point cane, Walker - 2 wheeled, and Wheelchair (manual)  PLOF: Independent  PATIENT GOALS   Patient would like to be fully independent, walking again. Pick up grandchildren, shop, participate in family and community activities without the wheelchair.  OBJECTIVE:  COGNITION: Overall cognitive status: Within functional limits for tasks assessed   [facility] report [facility] pains. MUSCLE LENGTH: Hamstrings: Right 35 deg from full ext;  Thomas test: Right 17 deg past extension;   LOWER EXTREMITY ROM:     Active  Right eval Left eval RT 09/27/22  Hip flexion     Hip extension     Hip abduction     Hip adduction     Hip internal rotation     Hip external rotation     Knee flexion 107 122 110  Knee extension -14 0 -12  Ankle dorsiflexion     Ankle plantarflexion     Ankle inversion     Ankle eversion      (Blank rows = not tested)    LOWER EXTREMITY MMT:  MMT  Right eval Left eval Right 09/27/22 Left 09/27/22  Hip flexion 4+ 4+ 4+ 5  Hip extension 4 4+ 4 4+  Hip abduction 4 5 4+ 5  Hip adduction 4+ 4+    Hip internal rotation      Hip external rotation      Knee flexion _0 Knee extension 4+ 5 4+ 5  Ankle dorsiflexion      Ankle plantarflexion      Ankle inversion      Ankle eversion      (Blank rows = not tested)   TRANSFERS: Squat pivot transfer: Complete Independence (without AD)  RAMP Complete Independence  GAIT: Gait pattern: step to pattern, step through pattern, decreased stance time- Right, decreased stride length, Right steppage, Right foot flat, knee flexed in stance- Right,  antalgic, and decreased trunk rotation Distance walked: 20' Assistive device utilized: Environmental consultant - 2 wheeled Level of assistance: CGA Comments: Demonstrates good alignment with prosthesis; Rt knee is flexed throughout gait cycle with some mild buckling noted and antalgic patterning.  FUNCTIONAL TESTs:  30 seconds chair stand test 10 times in 30 seconds, reliant on arm rests and Lt foot offset (11 reps 09/27/22) Timed up and go (TUG): 31.5 with RW (15 sec no AD 09/27/22)  CURRENT PROSTHETIC WEAR ASSESSMENT: Patient is independent with: residual limb care, care of non-amputated limb, prosthetic cleaning, and proper wear schedule/adjustment Patient is dependent with: correct ply sock adjustment Donning prosthesis: Complete Independence Doffing prosthesis: Modified independence Prosthetic wear tolerance: 2 hours/day, 7 days/week Prosthetic weight bearing tolerance: Not tested Edema: not tested Residual limb condition: scars healed, 2 areas of skin abrasion without overt redness distal residual tiba, and proximal to knee approx 10 cm 2 areas  PATIENT SURVEYS:  LEFS 25/80 (was 24/80 = 30%)  TODAY'S TREATMENT:  09/27/22 Reassess LEFS TUG MMT AROM  Partial squats with rail support 2 x 10 Step ups 4 inch x 10 each HHA x 2   09/20/22 Standing:   Standing hip abduction x20  hip extension x20 Alternating toe taps 4" with 1 HHA Shallow squats 10X Side stepping in hallway no AD or UE 2RT (12' distance) Gait 200 feet with RW Standing GTB rows 2X10 with CGA  Extensions GTB 2X10 with CGA   09/18/22 Seated: Shoulder row GTB x20 Shoulder extension GTB x20 Shoulder ER bilat GTB x20 LAQ 3# 2 x 10 each  Gait in TM 5 min 43mh HHA x 2   Standing hip abduction x20  Standing hip extension x20    TREATMENT:  PATIENT EDUCATED ON FOLLOWING PROSTHETIC CARE: Prosthetic wear tolerance: 2 hours/ 2x/day, 7 days/week Prosthetic weight bearing tolerance: Reassess during future visits Other education  Skin check, Residual limb care, Correct ply sock adjustment, Proper doffing, and Proper wear schedule/adjustment Low load long duration stretching of Rt knee to increase extension.   PATIENT EDUCATION: Education details:08/30/22: prone positioning, HEP; EVAL: Findings, POC, knee extension ROM, skin checks, ply socks (use not needed yet.) Person educated: Patient Education method: Explanation Education comprehension: verbalized understanding   HOME EXERCISE PROGRAM: Access Code: [email or web address] 09/03/22 - Sit to Stand with Counter Support  - 2 x daily - 7 x weekly - 1-2 sets - 10 reps  Date: 08/30/2022 - Standing Hip Abduction with Resistance at Ankles and Counter Support  - 2 x daily - 7 x weekly - 2 sets - 10 reps - Standing Hip Extension with Resistance at Ankles and Counter Support  - 2 x daily - 7 x weekly - 2 sets - 10 reps  ASSESSMENT:  CLINICAL IMPRESSION: Patient making good progress to therapy goals. Showing improved function with ambulation, transfers and TUG test. Remains limited by RT knee ROM restrictions, weakness and decreased subjective improvement reported. Patient will continue to benefit from skilled therapy services to address remaining deficits for improved functional mobility and LOF with ADLs.   OBJECTIVE IMPAIRMENTS  Abnormal gait, decreased activity tolerance, decreased balance, decreased endurance, decreased knowledge of condition, decreased knowledge of use of DME, decreased mobility, difficulty walking, decreased ROM, decreased strength, impaired flexibility, improper body mechanics, and prosthetic dependency .   ACTIVITY LIMITATIONS carrying, lifting, bending, standing, squatting, stairs, transfers, and locomotion level  PARTICIPATION LIMITATIONS: driving, shopping, and community activity  PERSONAL FACTORS Time since onset of injury/illness/exacerbation and Transportation are also affecting patient's  functional outcome.   REHAB POTENTIAL: Excellent  CLINICAL DECISION MAKING: Stable/uncomplicated  EVALUATION COMPLEXITY: Low   GOALS: Goals reviewed with patient? Yes  SHORT TERM GOALS: Target date: 09/03/22  Patient will be independent with initial HEP and self-management strategies to improve functional outcomes Baseline: Initiated Goal status: MET  2. Patient will improve 30 second chair stand test to 12 stands in 30 seconds to show significant improvement in functional strength and transfers Baseline: 11x in 30 seconds Goal status: IN progress   LONG TERM GOALS: Target date: 10/01/22  Patient will be independent with advanced HEP and self-management strategies to improve functional outcomes Baseline:  Goal status: IN PROGRESS  2.  Patient will improve LEFS score by 9 points to indicate improvement in functional outcomes Baseline: 25/80 Goal status: IN PROGRESS  3.  Patient will have RT knee AROM 0-120 degrees to improve functional mobility and facilitate squatting to pick up items from floor. Baseline: -12 to 110 Goal status: IN PROGRESS  4. Patient will have equal to or > 4+/5 MMT throughout 5/5 to improve ability to perform functional mobility, stair ambulation and ADLs.  Baseline: see MMT Goal status: Partially MET (all except RT hip extension)  5. Patient will be able to  ambulate =/>5 minutes with LRAD to increase participation in family and community activities out of the house. Baseline: about 5 minutes Goal status: MET  6. Patient will improve TUG to <20 seconds with LRAD and prosthetic to show significant improvement and independence with transfers and ambulation. Baseline: 15 sec with no AD  Goal status: MET   PLAN: PT FREQUENCY: 2x/week  PT DURATION: 4 weeks  PLANNED INTERVENTIONS: Therapeutic exercises, Therapeutic activity, Neuromuscular re-education, Balance training, Gait training, Patient/Family education, Self Care, Joint mobilization, Stair training, Orthotic/Fit training, Prosthetic training, DME instructions, Dry Needling, Electrical stimulation, Wheelchair mobility training, Cryotherapy, Moist heat, scar mobilization, Taping, Ultrasound, Biofeedback, Ionotophoresis 45m/ml Dexamethasone, Manual therapy, and Re-evaluation  PLAN FOR NEXT SESSION: Increase dynamic balance challenge, progress to gait with LRAD.  8:46 AM, 09/27/22 CJosue HectorPT DPT  Physical Therap[facility] ([phone]

## 2022-10-02 ENCOUNTER — Encounter (HOSPITAL_COMMUNITY): Payer: 59 | Admitting: Physical Therapy

## 2022-10-02 ENCOUNTER — Telehealth (HOSPITAL_COMMUNITY): Payer: Self-pay | Admitting: Physical Therapy

## 2022-10-02 NOTE — Telephone Encounter (Signed)
Pt did not show for appt.  Called and spoke to pt who states she missed transportation.  Reminded of next appt and states she will be here.  Teena Irani, PTA/CLT [place] Ph: [phone]

## 2022-10-04 ENCOUNTER — Ambulatory Visit (HOSPITAL_COMMUNITY): Payer: 59 | Admitting: Physical Therapy

## 2022-10-04 DIAGNOSIS — R262 Difficulty in walking, not elsewhere classified: Secondary | ICD-10-CM | POA: Diagnosis not present

## 2022-10-04 DIAGNOSIS — R2689 Other abnormalities of gait and mobility: Secondary | ICD-10-CM

## 2022-10-04 DIAGNOSIS — M6281 Muscle weakness (generalized): Secondary | ICD-10-CM

## 2022-10-04 DIAGNOSIS — Z89511 Acquired absence of right leg below knee: Secondary | ICD-10-CM

## 2022-10-04 NOTE — Therapy (Signed)
OUTPATIENT PHYSICAL THERAPY PROSTHETICS TREATMENT   Patient Name: Kerry Randall MRN: [id number] DOB:26-Jul-1961, 61 y.o., female Today's Date: 10/04/2022    PCP: Dr. Brett Albino - [facility] REFERRING PROVIDER: Meridee Score, MD   PT End of Session - 10/04/22 1315     Visit Number 9    Number of Visits 16    Date for PT Re-Evaluation 10/25/22    Authorization Type UHC Medicare ([place] [place])    Authorization Time Period No Auth, No visit limit    Progress Note Due on Visit 16    PT Start Time 1304    PT Stop Time 1345    PT Time Calculation (min) 41 min    Equipment Utilized During Treatment Gait belt    Activity Tolerance Patient tolerated treatment well    Behavior During Therapy WFL for tasks assessed/performed             Past Medical History:  Diagnosis Date   Atrial fibrillation ([place])    Colon polyps    Diabetes mellitus without complication ([place])    GERD (gastroesophageal reflux disease)    Heart disease, hyperkinetic    Hypercholesteremia    Hypertension    Kidney disease    Leg pain    Major depressive disorder, recurrent ([place])    Palpitations    Vitamin D deficiency    Past Surgical History:  Procedure Laterality Date   ABDOMINAL AORTOGRAM W/LOWER EXTREMITY N/A 04/26/2021   Procedure: ABDOMINAL AORTOGRAM W/LOWER EXTREMITY;  Surgeon: Wellington Hampshire, MD;  Location: [facility];  Service: Cardiovascular;  Laterality: N/A;   ABDOMINAL AORTOGRAM W/LOWER EXTREMITY Right 04/02/2022   Procedure: ABDOMINAL AORTOGRAM W/LOWER EXTREMITY;  Surgeon: Cherre Robins, MD;  Location: [facility];  Service: Vascular;  Laterality: Right;   AMPUTATION Right 04/04/2022   Procedure: RIGHT BELOW KNEE AMPUTATION;  Surgeon: Newt Minion, MD;  Location: [place];  Service: Orthopedics;  Laterality: Right;   AMPUTATION TOE Bilateral    small toe on left foot; all toes on right foot   PERIPHERAL VASCULAR BALLOON ANGIOPLASTY Right 04/26/2021   Procedure:  PERIPHERAL VASCULAR BALLOON ANGIOPLASTY;  Surgeon: Wellington Hampshire, MD;  Location: [facility];  Service: Cardiovascular;  Laterality: Right;  superficial femoral   PERIPHERAL VASCULAR INTERVENTION Right 04/26/2021   Procedure: PERIPHERAL VASCULAR INTERVENTION;  Surgeon: Wellington Hampshire, MD;  Location: [facility];  Service: Cardiovascular;  Laterality: Right;  Iliac stent   PERIPHERAL VASCULAR INTERVENTION Bilateral 04/02/2022   Procedure: PERIPHERAL VASCULAR INTERVENTION;  Surgeon: Cherre Robins, MD;  Location: [facility];  Service: Vascular;  Laterality: Bilateral;  common iliac   TOTAL VAGINAL HYSTERECTOMY  12/1998   Patient Active Problem List   Diagnosis Date Noted   Severe protein-calorie malnutrition ([place])    Diabetic infection of right foot ([place]) 03/29/2022   Stage 3a chronic kidney disease (CKD) ([place]) 03/29/2022   Atrial fibrillation (HCC)    Insulin-requiring or dependent type II diabetes mellitus (HCC)    PAD (peripheral artery disease) (HCC)    Major depressive disorder, recurrent (HCC)    CAD (coronary artery disease)    Gangrene of right foot ([place])    Critical lower limb ischemia ([place]) 04/26/2021    ONSET DATE: 04/04/22 Rt BKA  REFERRING DIAG: Z89.511 (ICD-10-CM) - History of right below knee amputation   THERAPY DIAG:  Difficulty in walking, not elsewhere classified  Muscle weakness (generalized)  Other abnormalities of gait  and mobility  Hx of right BKA ([place])  Rationale for Evaluation and Treatment Rehabilitation  SUBJECTIVE:   SUBJECTIVE STATEMENT: Pt states she fell out of her chair reaching for something in floor at home prior to coming for her appointment.  Comes today in wheelchair.  States she is still fearful of walking on her own and mostly uses her wheelchair at home also.  States her apartment is very crowded also which makes her nervous.  She feels she is able to do well in therapy but is not able to do as well on her own.    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 After hospitalization, she underwent rehab at a SNF for about 2 weeks. No HHPT. Reports she has not been exercising. Received RLE prosthesis last Monday from [facility]. She reports she knows very little about the prosthesis, but was able to walk in // bars a few times while having it fitted. She has been instructed to wear 2 hours each day AM/PM and has been working up to this. It has been sore adjusting to wearing this. [facility].  Pt accompanied by: self Has been receiving transportation services.  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 No  PRECAUTIONS: None  WEIGHT BEARING RESTRICTIONS No  FALLS: Has patient fallen in last 6 months? Yes. Number of falls 6  LIVING ENVIRONMENT: Lives with: lives alone Lives in: House/apartment Home Access: Level entry Home layout: One level Stairs: No (there is one ramp to get to apartment - 94f) Has following equipment at home: Single point cane, Walker - 2 wheeled, and Wheelchair (manual)  PLOF: Independent  PATIENT GOALS   Patient would like to be fully independent, walking again. Pick up grandchildren, shop, participate in family and community activities without the wheelchair.  OBJECTIVE:  COGNITION: Overall cognitive status: Within functional limits for tasks assessed   [facility] report [facility] pains. MUSCLE LENGTH: Hamstrings: Right 35 deg from full ext;  Thomas test: Right 17 deg past extension;   LOWER EXTREMITY ROM:     Active  Right eval Left eval RT 09/27/22  Hip flexion     Hip extension     Hip abduction     Hip adduction     Hip internal rotation     Hip external rotation     Knee flexion 107 122 110  Knee extension -14 0 -12  Ankle dorsiflexion     Ankle plantarflexion     Ankle inversion     Ankle eversion      (Blank rows = not tested)     LOWER EXTREMITY MMT:  MMT Right eval Left eval Right 09/27/22 Left 09/27/22  Hip flexion 4+ 4+ 4+ 5  Hip extension 4 4+ 4 4+  Hip abduction 4 5 4+ 5  Hip adduction 4+ 4+    Hip internal rotation      Hip external rotation      Knee flexion _0 Knee extension 4+ 5 4+ 5  Ankle dorsiflexion      Ankle plantarflexion      Ankle inversion      Ankle eversion      (Blank rows = not tested)   TRANSFERS: Squat pivot transfer: Complete Independence (without AD)  RAMP Complete Independence  GAIT: Gait pattern: step to pattern, step through pattern, decreased stance  time- Right, decreased stride length, Right steppage, Right foot flat, knee flexed in stance- Right, antalgic, and decreased trunk rotation Distance walked: 20' Assistive device utilized: Environmental consultant - 2 wheeled Level of assistance: CGA Comments: Demonstrates good alignment with prosthesis; Rt knee is flexed throughout gait cycle with some mild buckling noted and antalgic patterning.  FUNCTIONAL TESTs:  30 seconds chair stand test 10 times in 30 seconds, reliant on arm rests and Lt foot offset (11 reps 09/27/22) Timed up and go (TUG): 31.5 with RW (15 sec no AD 09/27/22)  CURRENT PROSTHETIC WEAR ASSESSMENT: Patient is independent with: residual limb care, care of non-amputated limb, prosthetic cleaning, and proper wear schedule/adjustment Patient is dependent with: correct ply sock adjustment Donning prosthesis: Complete Independence Doffing prosthesis: Modified independence Prosthetic wear tolerance: 2 hours/day, 7 days/week Prosthetic weight bearing tolerance: Not tested Edema: not tested Residual limb condition: scars healed, 2 areas of skin abrasion without overt redness distal residual tiba, and proximal to knee approx 10 cm 2 areas  PATIENT SURVEYS:  LEFS 25/80 (was 24/80 = 30%)  TODAY'S TREATMENT:  10/04/22 Standing:  hip abduction with RTB at ankles 2X10  Hip extension with RTB at ankles 2X10  Marching  RTB at ankles 2X10 alternating with 1 HHA  Side stepping RTB around thighs no UE in hallway 3RT (12' distance)  Vectors 5X5" each LE with 1 UE assist Ambulation using SPC 200 feet, fwd gaze/sequencing/step through  09/27/22 Reassess LEFS TUG MMT AROM  Partial squats with rail support 2 x 10 Step ups 4 inch x 10 each HHA x 2   09/20/22 Standing:  Standing hip abduction x20  hip extension x20 Alternating toe taps 4" with 1 HHA Shallow squats 10X Side stepping in hallway no AD or UE 2RT (12' distance) Gait 200 feet with RW Standing GTB rows 2X10 with CGA  Extensions GTB 2X10 with CGA   09/18/22 Seated: Shoulder row GTB x20 Shoulder extension GTB x20 Shoulder ER bilat GTB x20 LAQ 3# 2 x 10 each  Gait in TM 5 min 23mh HHA x 2   Standing hip abduction x20  Standing hip extension x20    TREATMENT:  PATIENT EDUCATED ON FOLLOWING PROSTHETIC CARE: Prosthetic wear tolerance: 2 hours/ 2x/day, 7 days/week Prosthetic weight bearing tolerance: Reassess during future visits Other education  Skin check, Residual limb care, Correct ply sock adjustment, Proper doffing, and Proper wear schedule/adjustment Low load long duration stretching of Rt knee to increase extension.   PATIENT EDUCATION: Education details:08/30/22: prone positioning, HEP; EVAL: Findings, POC, knee extension ROM, skin checks, ply socks (use not needed yet.) Person educated: Patient Education method: Explanation Education comprehension: verbalized understanding   HOME EXERCISE PROGRAM: Access Code: [email or web address] 09/03/22 - Sit to Stand with Counter Support  - 2 x daily - 7 x weekly - 1-2 sets - 10 reps  Date: 08/30/2022 - Standing Hip Abduction with Resistance at Ankles and Counter Support  - 2 x daily - 7 x weekly - 2 sets - 10 reps - Standing Hip Extension with Resistance at Ankles and Counter Support  - 2 x daily - 7 x weekly - 2 sets - 10 reps  ASSESSMENT:  CLINICAL IMPRESSION: Patient continues to  lack confidence to push herself outside of therapy.  Instructed with gait using SPC and [place].  Pt without LOB or issues during therapy other than discomfort in residual limb.  Pt reportedly fell onto this prior to her Lt hip hitting  the ground this morning.  Educated to keep close check of any bruising or breakdown as she would need to become NWB and not place her prosthesis on until it is healed.  Encouraged to increase her activity at home and increase her walking/wheelchair time to 5//50 with goal of coming to therapy using RW rather than chair.  Focused on balance/stability this session.  Noted fatigue with exercises today.  Pt will continue to benefit from skilled therapy services to address remaining deficits for improved functional mobility and LOF with ADLs.   OBJECTIVE IMPAIRMENTS Abnormal gait, decreased activity tolerance, decreased balance, decreased endurance, decreased knowledge of condition, decreased knowledge of use of DME, decreased mobility, difficulty walking, decreased ROM, decreased strength, impaired flexibility, improper body mechanics, and prosthetic dependency .   ACTIVITY LIMITATIONS carrying, lifting, bending, standing, squatting, stairs, transfers, and locomotion level  PARTICIPATION LIMITATIONS: driving, shopping, and community activity  PERSONAL FACTORS Time since onset of injury/illness/exacerbation and Transportation are also affecting patient's functional outcome.   REHAB POTENTIAL: Excellent  CLINICAL DECISION MAKING: Stable/uncomplicated  EVALUATION COMPLEXITY: Low   GOALS: Goals reviewed with patient? Yes  SHORT TERM GOALS: Target date: 09/03/22  Patient will be independent with initial HEP and self-management strategies to improve functional outcomes Baseline: Initiated Goal status: MET  2. Patient will improve 30 second chair stand test to 12 stands in 30 seconds to show significant improvement in functional  strength and transfers Baseline: 11x in 30 seconds Goal status: IN progress   LONG TERM GOALS: Target date: 10/01/22  Patient will be independent with advanced HEP and self-management strategies to improve functional outcomes Baseline:  Goal status: IN PROGRESS  2.  Patient will improve LEFS score by 9 points to indicate improvement in functional outcomes Baseline: 25/80 Goal status: IN PROGRESS  3.  Patient will have RT knee AROM 0-120 degrees to improve functional mobility and facilitate squatting to pick up items from floor. Baseline: -12 to 110 Goal status: IN PROGRESS  4. Patient will have equal to or > 4+/5 MMT throughout 5/5 to improve ability to perform functional mobility, stair ambulation and ADLs.  Baseline: see MMT Goal status: Partially MET (all except RT hip extension)  5. Patient will be able to ambulate =/>5 minutes with LRAD to increase participation in family and community activities out of the house. Baseline: about 5 minutes Goal status: MET  6. Patient will improve TUG to <20 seconds with LRAD and prosthetic to show significant improvement and independence with transfers and ambulation. Baseline: 15 sec with no AD  Goal status: MET   PLAN: PT FREQUENCY: 2x/week  PT DURATION: 4 weeks  PLANNED INTERVENTIONS: Therapeutic exercises, Therapeutic activity, Neuromuscular re-education, Balance training, Gait training, Patient/Family education, Self Care, Joint mobilization, Stair training, Orthotic/Fit training, Prosthetic training, DME instructions, Dry Needling, Electrical stimulation, Wheelchair mobility training, Cryotherapy, Moist heat, scar mobilization, Taping, Ultrasound, Biofeedback, Ionotophoresis 73m/ml Dexamethasone, Manual therapy, and Re-evaluation  PLAN FOR NEXT SESSION: Increase dynamic balance challenge, progress to gait with LRAD.  3:06 PM, 10/04/22 ATeena Irani PTA/CLT CEarlyPh:  [phone]

## 2022-10-08 ENCOUNTER — Ambulatory Visit (INDEPENDENT_AMBULATORY_CARE_PROVIDER_SITE_OTHER): Payer: 59 | Admitting: Orthopedic Surgery

## 2022-10-08 ENCOUNTER — Ambulatory Visit (INDEPENDENT_AMBULATORY_CARE_PROVIDER_SITE_OTHER): Payer: 59

## 2022-10-08 ENCOUNTER — Encounter: Payer: Self-pay | Admitting: Orthopedic Surgery

## 2022-10-08 DIAGNOSIS — Z89511 Acquired absence of right leg below knee: Secondary | ICD-10-CM

## 2022-10-08 NOTE — Progress Notes (Signed)
Office Visit Note   Patient: Kerry Randall           Date of Birth: 02-04-1961           MRN: [id number] Visit Date: 10/08/2022              Requested by: Center, [facility] [address] [place],  [place] [address] PCP: [facility], [facility]  Chief Complaint  Patient presents with   Right Leg - Follow-up    04/04/2022 right BKA with Kerecis      HPI: Patient is a 61 year old woman who is 6 months status post right transtibial amputation with using Kerecis tissue graft reinforcement.  She is 6 months out from surgery ambulating with her leg she states she is wearing increased ply socks and may need a new socket.  Assessment & Plan: Visit Diagnoses:  1. History of right below knee amputation (HCC)     Plan: We will follow-up in 3 months to evaluate for a new socket.  Follow-Up Instructions: No follow-ups on file.   Ortho Exam  Patient is alert, oriented, no adenopathy, well-dressed, normal affect, normal respiratory effort. Examination patient is independently ambulating she is using no cane or crutch.  She is still working with physical therapy.  Her residual limb is well consolidated with a palpable ridge between the tibia and fibula.  Radiograph shows a stable calcified bony bridge.  Imaging: XR Tibia/Fibula Right  Result Date: 10/08/2022 2 views of the right tibia and knee shows tricompartmental arthritis of the knee and healing syndesmosis between the tibia and fibula.  No images are attached to the encounter.  Labs: Lab Results  Component Value Date   HGBA1C 7.0 (H) 03/30/2022   ESRSEDRATE 94 (H) 03/31/2022   ESRSEDRATE 85 (H) 03/30/2022   ESRSEDRATE 72 (H) 03/29/2022   CRP 16.2 (H) 03/31/2022   CRP 18.0 (H) 03/30/2022   CRP 17.7 (H) 03/29/2022   REPTSTATUS 04/03/2022 FINAL 03/29/2022   CULT  03/29/2022    NO GROWTH 5 DAYS Performed at [facility], [address], [place], [place] [address]      Lab Results  Component Value Date    ALBUMIN 2.2 (L) 04/06/2022   ALBUMIN 2.2 (L) 04/05/2022   ALBUMIN 2.1 (L) 04/04/2022   PREALBUMIN 13.5 (L) 03/29/2022    Lab Results  Component Value Date   MG 1.7 04/06/2022   MG 1.8 04/05/2022   MG 1.6 (L) 04/04/2022   No results found for: "VD25OH"  Lab Results  Component Value Date   PREALBUMIN 13.5 (L) 03/29/2022      Latest Ref Rng & Units 04/06/2022    1:58 AM 04/05/2022    1:29 AM 04/04/2022    2:44 AM  CBC EXTENDED  WBC 4.0 - 10.5 K/uL 8.6  9.4  9.7   RBC 3.87 - 5.11 MIL/uL 3.39  3.50  3.33   Hemoglobin 12.0 - 15.0 g/dL 9.9  86.5  9.6   HCT 78.4 - 46.0 % 30.7  32.0  30.0   Platelets 150 - 400 K/uL 168  201  172   NEUT# 1.7 - 7.7 K/uL  6.2  5.8   Lymph# 0.7 - 4.0 K/uL  2.0  2.7      There is no height or weight on file to calculate BMI.  Orders:  Orders Placed This Encounter  Procedures   XR Tibia/Fibula Right   No orders of the defined types were placed in this encounter.  Procedures: No procedures performed  Clinical Data: No additional findings.  ROS:  All other systems negative, except as noted in the HPI. Review of Systems  Objective: Vital Signs: There were no vitals taken for this visit.  Specialty Comments:  No specialty comments available.  PMFS History: Patient Active Problem List   Diagnosis Date Noted   Severe protein-calorie malnutrition (HCC)    Diabetic infection of right foot (HCC) 03/29/2022   Stage 3a chronic kidney disease (CKD) (HCC) 03/29/2022   Atrial fibrillation (HCC)    Insulin-requiring or dependent type II diabetes mellitus (HCC)    PAD (peripheral artery disease) (HCC)    Major depressive disorder, recurrent (HCC)    CAD (coronary artery disease)    Gangrene of right foot (HCC)    Critical lower limb ischemia (HCC) 04/26/2021   Past Medical History:  Diagnosis Date   Atrial fibrillation (HCC)    Colon polyps    Diabetes mellitus without complication (HCC)    GERD (gastroesophageal reflux disease)     Heart disease, hyperkinetic    Hypercholesteremia    Hypertension    Kidney disease    Leg pain    Major depressive disorder, recurrent (HCC)    Palpitations    Vitamin D deficiency     Family History  Problem Relation Age of Onset   Hypertension Mother    Diabetes type II Mother    Heart disease Mother    Throat cancer Father    Hypertension Sister    Depression Sister    Brain cancer Sister    Hypertension Maternal Aunt    Hyperlipidemia Maternal Aunt    Hypertension Maternal Uncle    Hyperlipidemia Maternal Uncle    Hypertension Paternal Aunt    Hyperlipidemia Paternal Aunt    Heart disease Paternal Aunt    Hypertension Paternal Uncle    Hyperlipidemia Paternal Uncle    Diabetes type II Daughter    Depression Daughter    Anxiety disorder Daughter    Lupus Paternal Grandmother     Past Surgical History:  Procedure Laterality Date   ABDOMINAL AORTOGRAM W/LOWER EXTREMITY N/A 04/26/2021   Procedure: ABDOMINAL AORTOGRAM W/LOWER EXTREMITY;  Surgeon: Iran Ouch, MD;  [facility];  Service: Cardiovascular;  Laterality: N/A;   ABDOMINAL AORTOGRAM W/LOWER EXTREMITY Right 04/02/2022   Procedure: ABDOMINAL AORTOGRAM W/LOWER EXTREMITY;  Surgeon: Leonie Douglas, MD;  Location: [facility];  Service: Vascular;  Laterality: Right;   AMPUTATION Right 04/04/2022   Procedure: RIGHT BELOW KNEE AMPUTATION;  Surgeon: Nadara Mustard, MD;  Location: [facility];  Service: Orthopedics;  Laterality: Right;   AMPUTATION TOE Bilateral    small toe on left foot; all toes on right foot   PERIPHERAL VASCULAR BALLOON ANGIOPLASTY Right 04/26/2021   Procedure: PERIPHERAL VASCULAR BALLOON ANGIOPLASTY;  Surgeon: Iran Ouch, MD;  [facility]: [facility];  Service: Cardiovascular;  Laterality: Right;  superficial femoral   PERIPHERAL VASCULAR INTERVENTION Right 04/26/2021   Procedure: PERIPHERAL VASCULAR INTERVENTION;  Surgeon: Iran Ouch, MD;  [facility];  Service: Cardiovascular;  Laterality: Right;  Iliac stent   PERIPHERAL VASCULAR INTERVENTION Bilateral 04/02/2022   Procedure: PERIPHERAL VASCULAR INTERVENTION;  Surgeon: Leonie Douglas, MD;  [facility]: [facility];  Service: Vascular;  Laterality: Bilateral;  common iliac   TOTAL VAGINAL HYSTERECTOMY  12/1998   Social History   Occupational History   Not on file  Tobacco Use   Smoking  status: Smoker, Current Status Unknown   Smokeless tobacco: Never  Substance and Sexual Activity   Alcohol use: Not on file   Drug use: Not on file   Sexual activity: Not on file

## 2022-10-09 ENCOUNTER — Encounter (HOSPITAL_COMMUNITY): Payer: 59 | Admitting: Physical Therapy

## 2022-10-11 ENCOUNTER — Ambulatory Visit (HOSPITAL_COMMUNITY): Payer: 59 | Admitting: Physical Therapy

## 2022-10-11 DIAGNOSIS — R262 Difficulty in walking, not elsewhere classified: Secondary | ICD-10-CM | POA: Diagnosis not present

## 2022-10-11 DIAGNOSIS — M6281 Muscle weakness (generalized): Secondary | ICD-10-CM

## 2022-10-11 NOTE — Therapy (Signed)
OUTPATIENT PHYSICAL THERAPY PROSTHETICS TREATMENT   Patient Name: Kerry Randall MRN: [id number] DOB:[birth year], 61 y.o., female Today's Date: 10/11/2022    PCP: Dr. Brett Albino - [facility] REFERRING PROVIDER: Meridee Score, MD   PT End of Session - 10/11/22 1029     Visit Number 10    Number of Visits 16    Date for PT Re-Evaluation 10/25/22    Authorization Type [employer] Medicare ([employer] [place] [employer])    Authorization Time Period No Auth, No visit limit    Progress Note Due on Visit 16    PT Start Time 1028    PT Stop Time 1108    PT Time Calculation (min) 40 min    Equipment Utilized During Treatment Gait belt    Activity Tolerance Patient tolerated treatment well    Behavior During Therapy WFL for tasks assessed/performed             Past Medical History:  Diagnosis Date   Atrial fibrillation ([place])    Colon polyps    Diabetes mellitus without complication ([place])    GERD (gastroesophageal reflux disease)    Heart disease, hyperkinetic    Hypercholesteremia    Hypertension    Kidney disease    Leg pain    Major depressive disorder, recurrent ([place])    Palpitations    Vitamin D deficiency    Past Surgical History:  Procedure Laterality Date   ABDOMINAL AORTOGRAM W/LOWER EXTREMITY N/A 04/26/2021   Procedure: ABDOMINAL AORTOGRAM W/LOWER EXTREMITY;  Surgeon: Wellington Hampshire, MD;  Location: [facility];  Service: Cardiovascular;  Laterality: N/A;   ABDOMINAL AORTOGRAM W/LOWER EXTREMITY Right 04/02/2022   Procedure: ABDOMINAL AORTOGRAM W/LOWER EXTREMITY;  Surgeon: Cherre Robins, MD;  Location: [facility];  Service: Vascular;  Laterality: Right;   AMPUTATION Right 04/04/2022   Procedure: RIGHT BELOW KNEE AMPUTATION;  Surgeon: Newt Minion, MD;  Location: [facility];  Service: Orthopedics;  Laterality: Right;   AMPUTATION TOE Bilateral    small toe on left foot; all toes on right foot   PERIPHERAL VASCULAR BALLOON ANGIOPLASTY Right 04/26/2021   Procedure:  PERIPHERAL VASCULAR BALLOON ANGIOPLASTY;  Surgeon: Wellington Hampshire, MD;  Location: [facility];  Service: Cardiovascular;  Laterality: Right;  superficial femoral   PERIPHERAL VASCULAR INTERVENTION Right 04/26/2021   Procedure: PERIPHERAL VASCULAR INTERVENTION;  Surgeon: Wellington Hampshire, MD;  Location: [facility];  Service: Cardiovascular;  Laterality: Right;  Iliac stent   PERIPHERAL VASCULAR INTERVENTION Bilateral 04/02/2022   Procedure: PERIPHERAL VASCULAR INTERVENTION;  Surgeon: Cherre Robins, MD;  Location: [facility];  Service: Vascular;  Laterality: Bilateral;  common iliac   TOTAL VAGINAL HYSTERECTOMY  12/1998   Patient Active Problem List   Diagnosis Date Noted   Severe protein-calorie malnutrition ([place])    Diabetic infection of right foot ([place]) 03/29/2022   Stage 3a chronic kidney disease (CKD) ([place]) 03/29/2022   Atrial fibrillation ([facility])    Insulin-requiring or dependent type II diabetes mellitus (H[facility])    PAD (peripheral artery disease) ([facility])    Major depressive disorder, recurrent ([facility])    CAD (coronary artery disease)    Gangrene of right foot ([place])    Critical lower limb ischemia ([place]) 04/26/2021    ONSET DATE: 04/04/22 Rt BKA  REFERRING DIAG: Z89.511 (ICD-10-CM) - History of right below knee amputation   THERAPY DIAG:  Difficulty in walking, not elsewhere classified  Muscle weakness (generalized)  Rationale for Evaluation and  Treatment Rehabilitation  SUBJECTIVE:   SUBJECTIVE STATEMENT: Doing well since last time. Has resumed awlking aorund home, sometimes with cane and sometimes without.    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 After hospitalization, she underwent rehab at a SNF for about 2 weeks. No HHPT. Reports she has not been exercising. Received RLE prosthesis last Monday from [facility]. She reports she knows very little about the prosthesis, but was able to walk in // bars a few  times while having it fitted. She has been instructed to wear 2 hours each day AM/PM and has been working up to this. It has been sore adjusting to wearing this. [facility]  Pt accompanied by: self Has been receiving transportation services.  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 No  PRECAUTIONS: None  WEIGHT BEARING RESTRICTIONS No  FALLS: Has patient fallen in last 6 months? Yes. Number of falls 6  LIVING ENVIRONMENT: Lives with: lives alone Lives in: House/apartment Home Access: Level entry Home layout: One level Stairs: No (there is one ramp to get to apartment - [address]f) Has following equipment at home: Single point cane, Walker - 2 wheeled, and Wheelchair (manual)  PLOF: Independent  PATIENT GOALS   Patient would like to be fully independent, walking again. Pick up grandchildren, shop, participate in family and community activities without the wheelchair.  OBJECTIVE:  COGNITION: Overall cognitive status: Within functional limits for tasks assessed   SENSATION: [facility] does report phantom pains. MUSCLE LENGTH: Hamstrings: Right 35 deg from full ext;  Thomas test: Right 17 deg past extension;   LOWER EXTREMITY ROM:     Active  Right eval Left eval RT 09/27/22  Hip flexion     Hip extension     Hip abduction     Hip adduction     Hip internal rotation     Hip external rotation     Knee flexion 107 122 110  Knee extension -14 0 -12  Ankle dorsiflexion     Ankle plantarflexion     Ankle inversion     Ankle eversion      (Blank rows = not tested)    LOWER EXTREMITY MMT:  MMT Right eval Left eval Right 09/27/22 Left 09/27/22  Hip flexion 4+ 4+ 4+ 5  Hip extension 4 4+ 4 4+  Hip abduction 4 5 4+ 5  Hip adduction 4+ 4+    Hip internal rotation      Hip external rotation      Knee flexion _0 Knee extension 4+ 5 4+ 5  Ankle dorsiflexion      Ankle plantarflexion      Ankle  inversion      Ankle eversion      (Blank rows = not tested)   TRANSFERS: Squat pivot transfer: Complete Independence (without AD)  RAMP Complete Independence  GAIT: Gait pattern: step to pattern, step through pattern, decreased stance time- Right, decreased stride length, Right steppage, Right foot flat, knee flexed in stance- Right, antalgic, and decreased trunk rotation Distance walked: 20' Assistive device utilized: WEnvironmental consultant- 2 wheeled Level of assistance: CGA Comments: Demonstrates good alignment with prosthesis; Rt knee is flexed throughout gait cycle with some mild buckling noted and antalgic patterning.  FUNCTIONAL TESTs:  30 seconds chair stand test 10 times in 30 seconds, reliant on arm rests and Lt  foot offset (11 reps 09/27/22) Timed up and go (TUG): 31.5 with RW (15 sec no AD 09/27/22)  CURRENT PROSTHETIC WEAR ASSESSMENT: Patient is independent with: residual limb care, care of non-amputated limb, prosthetic cleaning, and proper wear schedule/adjustment Patient is dependent with: correct ply sock adjustment Donning prosthesis: Complete Independence Doffing prosthesis: Modified independence Prosthetic wear tolerance: 2 hours/day, 7 days/week Prosthetic weight bearing tolerance: Not tested Edema: not tested Residual limb condition: scars healed, 2 areas of skin abrasion without overt redness distal residual tiba, and proximal to knee approx 10 cm 2 areas  PATIENT SURVEYS:  LEFS 25/80 (was 24/80 = 30%)  TODAY'S TREATMENT:  10/11/22 Sit to stand 2 x 10 Dyna disk taps with RLE x20 4 inch step ups x 15 each   Gait training with quad cane 250' then 200' feet to lobby Stairs 10 steps 7 inch Rt rail reciprocal   Staggered stance RLE on dyna disk with 2 lb med ball rotation/ flexion x 20 each    10/04/22 Standing:  hip abduction with RTB at ankles 2X10  Hip extension with RTB at ankles 2X10  Marching RTB at ankles 2X10 alternating with 1 HHA  Side stepping RTB around  thighs no UE in hallway 3RT (12' distance)  Vectors 5X5" each LE with 1 UE assist Ambulation using SPC 200 feet, fwd gaze/sequencing/step through   TREATMENT:  PATIENT EDUCATED ON FOLLOWING PROSTHETIC CARE: Prosthetic wear tolerance: 2 hours/ 2x/day, 7 days/week Prosthetic weight bearing tolerance: Reassess during future visits Other education  Skin check, Residual limb care, Correct ply sock adjustment, Proper doffing, and Proper wear schedule/adjustment Low load long duration stretching of Rt knee to increase extension.   PATIENT EDUCATION: Education details:08/30/22: prone positioning, HEP; EVAL: Findings, POC, knee extension ROM, skin checks, ply socks (use not needed yet.) Person educated: Patient Education method: Explanation Education comprehension: verbalized understanding   HOME EXERCISE PROGRAM: Access Code: [email or web address]  09/03/22 - Sit to Stand with Counter Support  - 2 x daily - 7 x weekly - 1-2 sets - 10 reps  Date: 08/30/2022 - Standing Hip Abduction with Resistance at Ankles and Counter Support  - 2 x daily - 7 x weekly - 2 sets - 10 reps - Standing Hip Extension with Resistance at Ankles and Counter Support  - 2 x daily - 7 x weekly - 2 sets - 10 reps  ASSESSMENT:  CLINICAL IMPRESSION: Patient well challenged with there ex progressions. Educated on use of cane on LT side for improved balance with gait. Patient well challenged with balance activity today. Also performed stairs in stair well. Patient educated on proper sequencing and use of hand rail for improved balance and safety. Patient visibly perspiring with activity today. Continues to be limited by some fatigue and dynamic instability with RLE. Patient will continue to benefit from skilled therapy services to reduce remaining deficits and improve functional ability.    OBJECTIVE IMPAIRMENTS Abnormal gait, decreased activity tolerance, decreased balance, decreased endurance, decreased knowledge of condition,  decreased knowledge of use of DME, decreased mobility, difficulty walking, decreased ROM, decreased strength, impaired flexibility, improper body mechanics, and prosthetic dependency .   ACTIVITY LIMITATIONS carrying, lifting, bending, standing, squatting, stairs, transfers, and locomotion level  PARTICIPATION LIMITATIONS: driving, shopping, and community activity  PERSONAL FACTORS Time since onset of injury/illness/exacerbation and Transportation are also affecting patient's functional outcome.   REHAB POTENTIAL: Excellent  CLINICAL DECISION MAKING: Stable/uncomplicated  EVALUATION COMPLEXITY: Low   GOALS: Goals reviewed with patient? Yes  SHORT TERM  GOALS: Target date: 09/03/22  Patient will be independent with initial HEP and self-management strategies to improve functional outcomes Baseline: Initiated Goal status: MET  2. Patient will improve 30 second chair stand test to 12 stands in 30 seconds to show significant improvement in functional strength and transfers Baseline: 11x in 30 seconds Goal status: IN progress   LONG TERM GOALS: Target date: 10/01/22  Patient will be independent with advanced HEP and self-management strategies to improve functional outcomes Baseline:  Goal status: IN PROGRESS  2.  Patient will improve LEFS score by 9 points to indicate improvement in functional outcomes Baseline: 25/80 Goal status: IN PROGRESS  3.  Patient will have RT knee AROM 0-120 degrees to improve functional mobility and facilitate squatting to pick up items from floor. Baseline: -12 to 110 Goal status: IN PROGRESS  4. Patient will have equal to or > 4+/5 MMT throughout 5/5 to improve ability to perform functional mobility, stair ambulation and ADLs.  Baseline: see MMT Goal status: Partially MET (all except RT hip extension)  5. Patient will be able to ambulate =/>5 minutes with LRAD to increase participation in family and community activities out of the house. Baseline:  about 5 minutes Goal status: MET  6. Patient will improve TUG to <20 seconds with LRAD and prosthetic to show significant improvement and independence with transfers and ambulation. Baseline: 15 sec with no AD  Goal status: MET   PLAN: PT FREQUENCY: 2x/week  PT DURATION: 4 weeks  PLANNED INTERVENTIONS: Therapeutic exercises, Therapeutic activity, Neuromuscular re-education, Balance training, Gait training, Patient/Family education, Self Care, Joint mobilization, Stair training, Orthotic/Fit training, Prosthetic training, DME instructions, Dry Needling, Electrical stimulation, Wheelchair mobility training, Cryotherapy, Moist heat, scar mobilization, Taping, Ultrasound, Biofeedback, Ionotophoresis 79m/ml Dexamethasone, Manual therapy, and Re-evaluation  PLAN FOR NEXT SESSION: Increase dynamic balance challenge, progress to gait with LRAD.  11:15 AM, 10/11/22 CJosue HectorPT DPT  [facility] ([phone]

## 2022-10-16 ENCOUNTER — Ambulatory Visit (HOSPITAL_COMMUNITY): Payer: 59 | Admitting: Physical Therapy

## 2022-10-16 DIAGNOSIS — R262 Difficulty in walking, not elsewhere classified: Secondary | ICD-10-CM

## 2022-10-16 DIAGNOSIS — M6281 Muscle weakness (generalized): Secondary | ICD-10-CM

## 2022-10-16 NOTE — Therapy (Signed)
OUTPATIENT PHYSICAL THERAPY PROSTHETICS TREATMENT   Patient Name: Kerry Randall MRN: [id number] DOB:Oct 07, 1961, 61 y.o., female Today's Date: 10/16/2022    PCP: Dr. Brett Albino - [facility] REFERRING PROVIDER: Meridee Score, MD   PT End of Session - 10/16/22 1052     Visit Number 11    Number of Visits 16    Date for PT Re-Evaluation 10/25/22    Authorization Type UHC Medicare ([place] Secondary)    Authorization Time Period No Auth, No visit limit    Progress Note Due on Visit 16    PT Start Time 1105    PT Stop Time 1145    PT Time Calculation (min) 40 min    Equipment Utilized During Treatment Gait belt    Activity Tolerance Patient tolerated treatment well    Behavior During Therapy WFL for tasks assessed/performed             Past Medical History:  Diagnosis Date   Atrial fibrillation ([place])    Colon polyps    Diabetes mellitus without complication ([place])    GERD (gastroesophageal reflux disease)    Heart disease, hyperkinetic    Hypercholesteremia    Hypertension    Kidney disease    Leg pain    Major depressive disorder, recurrent ([place])    Palpitations    Vitamin D deficiency    Past Surgical History:  Procedure Laterality Date   ABDOMINAL AORTOGRAM W/LOWER EXTREMITY N/A 04/26/2021   Procedure: ABDOMINAL AORTOGRAM W/LOWER EXTREMITY;  Surgeon: Wellington Hampshire, MD;  Location: [facility];  Service: Cardiovascular;  Laterality: N/A;   ABDOMINAL AORTOGRAM W/LOWER EXTREMITY Right 04/02/2022   Procedure: ABDOMINAL AORTOGRAM W/LOWER EXTREMITY;  Surgeon: Cherre Robins, MD;  Location: [facility];  Service: Vascular;  Laterality: Right;   AMPUTATION Right 04/04/2022   Procedure: RIGHT BELOW KNEE AMPUTATION;  Surgeon: Newt Minion, MD;  Location: [place];  Service: Orthopedics;  Laterality: Right;   AMPUTATION TOE Bilateral    small toe on left foot; all toes on right foot   PERIPHERAL VASCULAR BALLOON ANGIOPLASTY Right 04/26/2021   Procedure:  PERIPHERAL VASCULAR BALLOON ANGIOPLASTY;  Surgeon: Wellington Hampshire, MD;  Location: [facility];  Service: Cardiovascular;  Laterality: Right;  superficial femoral   PERIPHERAL VASCULAR INTERVENTION Right 04/26/2021   Procedure: PERIPHERAL VASCULAR INTERVENTION;  Surgeon: Wellington Hampshire, MD;  Location: [facility];  Service: Cardiovascular;  Laterality: Right;  Iliac stent   PERIPHERAL VASCULAR INTERVENTION Bilateral 04/02/2022   Procedure: PERIPHERAL VASCULAR INTERVENTION;  Surgeon: Cherre Robins, MD;  Location: [facility];  Service: Vascular;  Laterality: Bilateral;  common iliac   TOTAL VAGINAL HYSTERECTOMY  12/1998   Patient Active Problem List   Diagnosis Date Noted   Severe protein-calorie malnutrition ([place])    Diabetic infection of right foot ([place]) 03/29/2022   Stage 3a chronic kidney disease (CKD) ([place]) 03/29/2022   Atrial fibrillation (HCC)    Insulin-requiring or dependent type II diabetes mellitus (HCC)    PAD (peripheral artery disease) (HCC)    Major depressive disorder, recurrent (HCC)    CAD (coronary artery disease)    Gangrene of right foot ([place])    Critical lower limb ischemia ([place]) 04/26/2021    ONSET DATE: 04/04/22 Rt BKA  REFERRING DIAG: Z89.511 (ICD-10-CM) - History of right below knee amputation   THERAPY DIAG:  Difficulty in walking, not elsewhere classified  Muscle weakness (generalized)  Rationale for Evaluation and  Treatment Rehabilitation  SUBJECTIVE:   SUBJECTIVE STATEMENT: Spent the last few days out of prosthetic. Still a little sore putting it back on today. Doing ok other than having some personal issues that have her down today.    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 After hospitalization, she underwent rehab at a SNF for about 2 weeks. No HHPT. Reports she has not been exercising. Received RLE prosthesis last Monday from [facility]. She reports she knows very little  about the prosthesis, but was able to walk in // bars a few times while having it fitted. She has been instructed to wear 2 hours each day AM/PM and has been working up to this. It has been sore adjusting to wearing this. [facility].  Pt accompanied by: self Has been receiving transportation services.  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 No  PRECAUTIONS: None  WEIGHT BEARING RESTRICTIONS No  FALLS: Has patient fallen in last 6 months? Yes. Number of falls 6  LIVING ENVIRONMENT: Lives with: lives alone Lives in: House/apartment Home Access: Level entry Home layout: One level Stairs: No (there is one ramp to get to apartment - 42f) Has following equipment at home: Single point cane, Walker - 2 wheeled, and Wheelchair (manual)  PLOF: Independent  PATIENT GOALS   Patient would like to be fully independent, walking again. Pick up grandchildren, shop, participate in family and community activities without the wheelchair.  OBJECTIVE:  COGNITION: Overall cognitive status: Within functional limits for tasks assessed   SENSATION: [facility] RehabilitationShe does report phantom pains. MUSCLE LENGTH: Hamstrings: Right 35 deg from full ext;  Thomas test: Right 17 deg past extension;   LOWER EXTREMITY ROM:     Active  Right eval Left eval RT 09/27/22  Hip flexion     Hip extension     Hip abduction     Hip adduction     Hip internal rotation     Hip external rotation     Knee flexion 107 122 110  Knee extension -14 0 -12  Ankle dorsiflexion     Ankle plantarflexion     Ankle inversion     Ankle eversion      (Blank rows = not tested)    LOWER EXTREMITY MMT:  MMT Right eval Left eval Right 09/27/22 Left 09/27/22  Hip flexion 4+ 4+ 4+ 5  Hip extension 4 4+ 4 4+  Hip abduction 4 5 4+ 5  Hip adduction 4+ 4+    Hip internal rotation      Hip external rotation      Knee flexion _0 Knee extension 4+ 5 4+ 5  Ankle  dorsiflexion      Ankle plantarflexion      Ankle inversion      Ankle eversion      (Blank rows = not tested)   TRANSFERS: Squat pivot transfer: Complete Independence (without AD)  RAMP Complete Independence  GAIT: Gait pattern: step to pattern, step through pattern, decreased stance time- Right, decreased stride length, Right steppage, Right foot flat, knee flexed in stance- Right, antalgic, and decreased trunk rotation Distance walked: 20' Assistive device utilized: WEnvironmental consultant- 2 wheeled Level of assistance: CGA Comments: Demonstrates good alignment with prosthesis; Rt knee is flexed throughout gait cycle with some mild buckling noted and antalgic patterning.  FUNCTIONAL TESTs:  30 seconds  chair stand test 10 times in 30 seconds, reliant on arm rests and Lt foot offset (11 reps 09/27/22) Timed up and go (TUG): 31.5 with RW (15 sec no AD 09/27/22)  CURRENT PROSTHETIC WEAR ASSESSMENT: Patient is independent with: residual limb care, care of non-amputated limb, prosthetic cleaning, and proper wear schedule/adjustment Patient is dependent with: correct ply sock adjustment Donning prosthesis: Complete Independence Doffing prosthesis: Modified independence Prosthetic wear tolerance: 2 hours/day, 7 days/week Prosthetic weight bearing tolerance: Not tested Edema: not tested Residual limb condition: scars healed, 2 areas of skin abrasion without overt redness distal residual tiba, and proximal to knee approx 10 cm 2 areas  PATIENT SURVEYS:  LEFS 25/80 (was 24/80 = 30%)  TODAY'S TREATMENT:  10/16/22 Sit to stand 2 x 10 Standing knee flexion 2 x 10 Standing hip abduction 2 x 10 Standing hip extension 2 x 10  Dyna disk taps with alt BLE x20    Hurdle amb using SPC 4 RT Stairs 10 steps 7 inch Rt rail reciprocal  Staggered stance RLE on dyna disk with 2 lb med flexion x 20 each   Gait training with SPC 300' then 200' in hallway   10/11/22 Sit to stand 2 x 10 Dyna disk taps with  RLE x20 4 inch step ups x 15 each   Gait training with quad cane 250' then 200' feet to lobby Stairs 10 steps 7 inch Rt rail reciprocal  Staggered stance RLE on dyna disk with 2 lb med ball rotation/ flexion x 20 each   10/04/22 Standing:  hip abduction with RTB at ankles 2X10  Hip extension with RTB at ankles 2X10  Marching RTB at ankles 2X10 alternating with 1 HHA  Side stepping RTB around thighs no UE in hallway 3RT (12' distance)  Vectors 5X5" each LE with 1 UE assist Ambulation using SPC 200 feet, fwd gaze/sequencing/step through   TREATMENT:  PATIENT EDUCATED ON FOLLOWING PROSTHETIC CARE: Prosthetic wear tolerance: 2 hours/ 2x/day, 7 days/week Prosthetic weight bearing tolerance: Reassess during future visits Other education  Skin check, Residual limb care, Correct ply sock adjustment, Proper doffing, and Proper wear schedule/adjustment Low load long duration stretching of Rt knee to increase extension.   PATIENT EDUCATION: Education details:08/30/22: prone positioning, HEP; EVAL: Findings, POC, knee extension ROM, skin checks, ply socks (use not needed yet.) Person educated: Patient Education method: Explanation Education comprehension: verbalized understanding   HOME EXERCISE PROGRAM: Access Code: [email or web address]  09/03/22 - Sit to Stand with Counter Support  - 2 x daily - 7 x weekly - 1-2 sets - 10 reps  Date: 08/30/2022 - Standing Hip Abduction with Resistance at Ankles and Counter Support  - 2 x daily - 7 x weekly - 2 sets - 10 reps - Standing Hip Extension with Resistance at Ankles and Counter Support  - 2 x daily - 7 x weekly - 2 sets - 10 reps  ASSESSMENT:  CLINICAL IMPRESSION: Patient tolerated session well today. Continued with gait and functional mobility. Good return on stairs, but still just a little apprehensive and requires cues for sequencing when using cane for improved balance. Patient able to increase distance ambulated without rest but does show increased  fatigue. Patient well challenged with attempt to balance with LLE on compliant surface in staggered standing position. Patient will continue to benefit from skilled therapy services to reduce remaining deficits and improve functional ability.    OBJECTIVE IMPAIRMENTS Abnormal gait, decreased activity tolerance, decreased balance, decreased endurance, decreased knowledge of condition, decreased  knowledge of use of DME, decreased mobility, difficulty walking, decreased ROM, decreased strength, impaired flexibility, improper body mechanics, and prosthetic dependency .   ACTIVITY LIMITATIONS carrying, lifting, bending, standing, squatting, stairs, transfers, and locomotion level  PARTICIPATION LIMITATIONS: driving, shopping, and community activity  PERSONAL FACTORS Time since onset of injury/illness/exacerbation and Transportation are also affecting patient's functional outcome.   REHAB POTENTIAL: Excellent  CLINICAL DECISION MAKING: Stable/uncomplicated  EVALUATION COMPLEXITY: Low   GOALS: Goals reviewed with patient? Yes  SHORT TERM GOALS: Target date: 09/03/22  Patient will be independent with initial HEP and self-management strategies to improve functional outcomes Baseline: Initiated Goal status: MET  2. Patient will improve 30 second chair stand test to 12 stands in 30 seconds to show significant improvement in functional strength and transfers Baseline: 11x in 30 seconds Goal status: IN progress   LONG TERM GOALS: Target date: 10/01/22  Patient will be independent with advanced HEP and self-management strategies to improve functional outcomes Baseline:  Goal status: IN PROGRESS  2.  Patient will improve LEFS score by 9 points to indicate improvement in functional outcomes Baseline: 25/80 Goal status: IN PROGRESS  3.  Patient will have RT knee AROM 0-120 degrees to improve functional mobility and facilitate squatting to pick up items from floor. Baseline: -12 to 110 Goal  status: IN PROGRESS  4. Patient will have equal to or > 4+/5 MMT throughout 5/5 to improve ability to perform functional mobility, stair ambulation and ADLs.  Baseline: see MMT Goal status: Partially MET (all except RT hip extension)  5. Patient will be able to ambulate =/>5 minutes with LRAD to increase participation in family and community activities out of the house. Baseline: about 5 minutes Goal status: MET  6. Patient will improve TUG to <20 seconds with LRAD and prosthetic to show significant improvement and independence with transfers and ambulation. Baseline: 15 sec with no AD  Goal status: MET   PLAN: PT FREQUENCY: 2x/week  PT DURATION: 4 weeks  PLANNED INTERVENTIONS: Therapeutic exercises, Therapeutic activity, Neuromuscular re-education, Balance training, Gait training, Patient/Family education, Self Care, Joint mobilization, Stair training, Orthotic/Fit training, Prosthetic training, DME instructions, Dry Needling, Electrical stimulation, Wheelchair mobility training, Cryotherapy, Moist heat, scar mobilization, Taping, Ultrasound, Biofeedback, Ionotophoresis 29m/ml Dexamethasone, Manual therapy, and Re-evaluation  PLAN FOR NEXT SESSION: Increase dynamic balance challenge, progress to gait with LRAD.  11:44 AM, 10/16/22 CJosue HectorPT DPT  Physical Therapist with [facility] ([phone]

## 2022-10-23 ENCOUNTER — Encounter (HOSPITAL_COMMUNITY): Payer: 59 | Admitting: Physical Therapy

## 2022-10-25 ENCOUNTER — Encounter (HOSPITAL_COMMUNITY): Payer: 59 | Admitting: Physical Therapy

## 2022-10-30 ENCOUNTER — Encounter (HOSPITAL_COMMUNITY): Payer: 59 | Admitting: Physical Therapy

## 2023-01-07 ENCOUNTER — Ambulatory Visit (INDEPENDENT_AMBULATORY_CARE_PROVIDER_SITE_OTHER): Payer: 59

## 2023-01-07 ENCOUNTER — Ambulatory Visit (INDEPENDENT_AMBULATORY_CARE_PROVIDER_SITE_OTHER): Payer: 59 | Admitting: Orthopedic Surgery

## 2023-01-07 DIAGNOSIS — Z89511 Acquired absence of right leg below knee: Secondary | ICD-10-CM

## 2023-01-08 ENCOUNTER — Encounter: Payer: Self-pay | Admitting: Orthopedic Surgery

## 2023-01-08 NOTE — Progress Notes (Signed)
Office Visit Note   Patient: Kerry Randall           Date of Birth: 25-Sep-1961           MRN: [id number] Visit Date: 01/07/2023              Requested by: Center, [address] [place],  [place] [address] PCP: [facility], [facility]  Chief Complaint  Patient presents with   Right Leg - Follow-up    Hx right BKA      HPI: Patient is a 62 year old woman who is seen for evaluation of a right transtibial amputation patient has been having and bearing pain she has had modification of the socket without relief.  Patient states she fell on the residual limb this morning.  Assessment & Plan: Visit Diagnoses:  1. History of right below knee amputation [facility])     Plan: Patient is provided a prescription for new socket liner materials and supplies.  Follow-Up Instructions: Return if symptoms worsen or fail to improve.   Ortho Exam  Patient is alert, oriented, no adenopathy, well-dressed, normal affect, normal respiratory effort. Examination patient has a well consolidated syndesmotic calcification between the tibia and fibula.  There is a small abrasion to the residual limb with some scratches that extended proximally but there is no wound dehiscence no cellulitis.  Patient is an existing right transtibial  amputee.  Patient's current comorbidities are not expected to impact the ability to function with the prescribed prosthesis. Patient verbally communicates a strong desire to use a prosthesis. Patient currently requires mobility aids to ambulate without a prosthesis.  Expects not to use mobility aids with a new prosthesis.  Patient is a K3 level ambulator that spends a lot of time walking around on uneven terrain over obstacles, up and down stairs, and ambulates with a variable cadence.     Imaging: No results found. No images are attached to the encounter.  Labs: Lab Results  Component Value Date   HGBA1C 7.0 (H) 03/30/2022   ESRSEDRATE 94 (H)  03/31/2022   ESRSEDRATE 85 (H) 03/30/2022   ESRSEDRATE 72 (H) 03/29/2022   CRP 16.2 (H) 03/31/2022   CRP 18.0 (H) 03/30/2022   CRP 17.7 (H) 03/29/2022   REPTSTATUS 04/03/2022 FINAL 03/29/2022   CULT  03/29/2022    NO GROWTH 5 DAYS Performed at [facility], [place] [address], [place], [place] [address]      Lab Results  Component Value Date   ALBUMIN 2.2 (L) 04/06/2022   ALBUMIN 2.2 (L) 04/05/2022   ALBUMIN 2.1 (L) 04/04/2022   PREALBUMIN 13.5 (L) 03/29/2022    Lab Results  Component Value Date   MG 1.7 04/06/2022   MG 1.8 04/05/2022   MG 1.6 (L) 04/04/2022   No results found for: "VD25OH"  Lab Results  Component Value Date   PREALBUMIN 13.5 (L) 03/29/2022      Latest Ref Rng & Units 04/06/2022    1:58 AM 04/05/2022    1:29 AM 04/04/2022    2:44 AM  CBC EXTENDED  WBC 4.0 - 10.5 K/uL 8.6  9.4  9.7   RBC 3.87 - 5.11 MIL/uL 3.39  3.50  3.33   Hemoglobin 12.0 - 15.0 g/dL 9.9  10.0  9.6   HCT 36.0 - 46.0 % 30.7  32.0  30.0   Platelets 150 - 400 K/uL 168  201  172   NEUT# 1.7 - 7.7 K/uL  6.2  5.8  Lymph# 0.7 - 4.0 K/uL  2.0  2.7      There is no height or weight on file to calculate BMI.  Orders:  Orders Placed This Encounter  Procedures   XR Tibia/Fibula Right   No orders of the defined types were placed in this encounter.    Procedures: No procedures performed  Clinical Data: No additional findings.  ROS:  All other systems negative, except as noted in the HPI. Review of Systems  Objective: Vital Signs: There were no vitals taken for this visit.  Specialty Comments:  No specialty comments available.  PMFS History: Patient Active Problem List   Diagnosis Date Noted   Severe protein-calorie malnutrition ([place])    Diabetic infection of right foot ([place]) 03/29/2022   Stage 3a chronic kidney disease (CKD) ([place]) 03/29/2022   Atrial fibrillation ([place])    Insulin-requiring or dependent type II diabetes mellitus ([place])    PAD (peripheral artery  disease) (HCC)    Major depressive disorder, recurrent (HCC)    CAD (coronary artery disease)    Gangrene of right foot ([place])    Critical lower limb ischemia ([place]) 04/26/2021   Past Medical History:  Diagnosis Date   Atrial fibrillation (HCC)    Colon polyps    Diabetes mellitus without complication (HCC)    GERD (gastroesophageal reflux disease)    Heart disease, hyperkinetic    Hypercholesteremia    Hypertension    Kidney disease    Leg pain    Major depressive disorder, recurrent ([place])    Palpitations    Vitamin D deficiency     Family History  Problem Relation Age of Onset   Hypertension Mother    Diabetes type II Mother    Heart disease Mother    Throat cancer Father    Hypertension Sister    Depression Sister    Brain cancer Sister    Hypertension Maternal Aunt    Hyperlipidemia Maternal Aunt    Hypertension Maternal Uncle    Hyperlipidemia Maternal Uncle    Hypertension Paternal Aunt    Hyperlipidemia Paternal Aunt    Heart disease Paternal Aunt    Hypertension Paternal Uncle    Hyperlipidemia Paternal Uncle    Diabetes type II Daughter    Depression Daughter    Anxiety disorder Daughter    Lupus Paternal Grandmother     Past Surgical History:  Procedure Laterality Date   ABDOMINAL AORTOGRAM W/LOWER EXTREMITY N/A 04/26/2021   Procedure: ABDOMINAL AORTOGRAM W/LOWER EXTREMITY;  Surgeon: Wellington Hampshire, MD;  Location: [facility];  Service: Cardiovascular;  Laterality: N/A;   ABDOMINAL AORTOGRAM W/LOWER EXTREMITY Right 04/02/2022   Procedure: ABDOMINAL AORTOGRAM W/LOWER EXTREMITY;  Surgeon: Cherre Robins, MD;  Location: [facility];  Service: Vascular;  Laterality: Right;   AMPUTATION Right 04/04/2022   Procedure: RIGHT BELOW KNEE AMPUTATION;  Surgeon: Newt Minion, MD;  Location: [facility];  Service: Orthopedics;  Laterality: Right;   AMPUTATION TOE Bilateral    small toe on left foot; all toes on right foot   PERIPHERAL VASCULAR BALLOON  ANGIOPLASTY Right 04/26/2021   Procedure: PERIPHERAL VASCULAR BALLOON ANGIOPLASTY;  Surgeon: Wellington Hampshire, MD;  Location: [facility];  Service: Cardiovascular;  Laterality: Right;  superficial femoral   PERIPHERAL VASCULAR INTERVENTION Right 04/26/2021   Procedure: PERIPHERAL VASCULAR INTERVENTION;  Surgeon: Wellington Hampshire, MD;  Location: [facility];  Service: Cardiovascular;  Laterality: Right;  Iliac stent   PERIPHERAL VASCULAR INTERVENTION  Bilateral 04/02/2022   Procedure: PERIPHERAL VASCULAR INTERVENTION;  Surgeon: Cherre Robins, MD;  Location: [facility];  Service: Vascular;  Laterality: Bilateral;  common iliac   TOTAL VAGINAL HYSTERECTOMY  12/1998   Social History   Occupational History   Not on file  Tobacco Use   Smoking status: Smoker, Current Status Unknown   Smokeless tobacco: Never  Substance and Sexual Activity   Alcohol use: Not on file   Drug use: Not on file   Sexual activity: Not on file

## 2023-02-28 ENCOUNTER — Ambulatory Visit (INDEPENDENT_AMBULATORY_CARE_PROVIDER_SITE_OTHER): Payer: 59 | Admitting: Orthopedic Surgery

## 2023-02-28 DIAGNOSIS — Z89511 Acquired absence of right leg below knee: Secondary | ICD-10-CM | POA: Diagnosis not present

## 2023-03-01 ENCOUNTER — Encounter: Payer: Self-pay | Admitting: Orthopedic Surgery

## 2023-03-01 NOTE — Progress Notes (Signed)
Office Visit Note   Patient: Kerry Randall           Date of Birth: 02/17/1961           MRN: [id number] Visit Date: 02/28/2023              Requested by: [facility] [address] [place] [id number] [facility]CP[facility]  Chief Complaint  Patient presents with   Right Leg - Follow-up    Hx right bka      HPI: Patient is a 62 year old woman who is 1 year status post right transtibial amputation surgery May 10.  [facility].  Patient has no concerns.  She is not wearing her prosthesis today.  Assessment & Plan: Visit Diagnoses:  1. History of right below knee amputation     Plan: Patient was given a prescription for a new socket.  Follow-Up Instructions: Return if symptoms worsen or fail to improve.   Ortho Exam  Patient is alert, oriented, no adenopathy, well-dressed, normal affect, normal respiratory effort. Examination patient has a well consolidated residual limb.  She has no ulcers no cellulitis no calluses.  Patient is an existing right transtibial  amputee.  Patient's current comorbidities are not expected to impact the ability to function with the prescribed prosthesis. Patient verbally communicates a strong desire to use a prosthesis. Patient currently requires mobility aids to ambulate without a prosthesis.  Expects not to use mobility aids with a new prosthesis.  Patient is a K2 level ambulator that will use a prosthesis to walk around their home and the community over low level environmental barriers.      Imaging: No results found. No images are attached to the encounter.  Labs: Lab Results  Component Value Date   HGBA1C 7.0 (H) 03/30/2022   ESRSEDRATE 94 (H) 03/31/2022   ESRSEDRATE 85 (H) 03/30/2022   ESRSEDRATE 72 (H) 03/29/2022   CRP 16.2 (H) 03/31/2022   CRP 18.0 (H) 03/30/2022   CRP 17.7 (H) 03/29/2022   REPTSTATUS 04/03/2022 FINAL 03/29/2022   CULT  03/29/2022    NO  GROWTH 5 DAYS Performed at [facility], [address] [place], [place] [address]      Lab Results  Component Value Date   ALBUMIN 2.2 (L) 04/06/2022   ALBUMIN 2.2 (L) 04/05/2022   ALBUMIN 2.1 (L) 04/04/2022   PREALBUMIN 13.5 (L) 03/29/2022    Lab Results  Component Value Date   MG 1.7 04/06/2022   MG 1.8 04/05/2022   MG 1.6 (L) 04/04/2022   No results found for: "VD25OH"  Lab Results  Component Value Date   PREALBUMIN 13.5 (L) 03/29/2022      Latest Ref Rng & Units 04/06/2022    1:58 AM 04/05/2022    1:29 AM 04/04/2022    2:44 AM  CBC EXTENDED  WBC 4.0 - 10.5 K/uL 8.6  9.4  9.7   RBC 3.87 - 5.11 MIL/uL 3.39  3.50  3.33   Hemoglobin 12.0 - 15.0 g/dL 9.9  86.510.0  9.6   HCT 78.436.0 - 46.0 % 30.7  32.0  30.0   Platelets 150 - 400 K/uL 168  201  172   NEUT# 1.7 - 7.7 K/uL  6.2  5.8   Lymph# 0.7 - 4.0 K/uL  2.0  2.7      There is no height or weight on file to calculate BMI.  Orders:  No orders  of the defined types were placed in this encounter.  No orders of the defined types were placed in this encounter.    Procedures: No procedures performed  Clinical Data: No additional findings.  ROS:  All other systems negative, except as noted in the HPI. Review of Systems  Objective: Vital Signs: There were no vitals taken for this visit.  Specialty Comments:  No specialty comments available.  PMFS History: Patient Active Problem List   Diagnosis Date Noted   Severe protein-calorie malnutrition    Diabetic infection of right foot 03/29/2022   Stage 3a chronic kidney disease (CKD) 03/29/2022   Atrial fibrillation    Insulin-requiring or dependent type II diabetes mellitus    PAD (peripheral artery disease)    Major depressive disorder, recurrent    CAD (coronary artery disease)    Gangrene of right foot    Critical lower limb ischemia 04/26/2021   Past Medical History:  Diagnosis Date   Atrial fibrillation    Colon polyps    Diabetes mellitus  without complication    GERD (gastroesophageal reflux disease)    Heart disease, hyperkinetic    Hypercholesteremia    Hypertension    Kidney disease    Leg pain    Major depressive disorder, recurrent    Palpitations    Vitamin D deficiency     Family History  Problem Relation Age of Onset   Hypertension Mother    Diabetes type II Mother    Heart disease Mother    Throat cancer Father    Hypertension Sister    Depression Sister    Brain cancer Sister    Hypertension Maternal Aunt    Hyperlipidemia Maternal Aunt    Hypertension Maternal Uncle    Hyperlipidemia Maternal Uncle    Hypertension Paternal Aunt    Hyperlipidemia Paternal Aunt    Heart disease Paternal Aunt    Hypertension Paternal Uncle    Hyperlipidemia Paternal Uncle    Diabetes type II Daughter    Depression Daughter    Anxiety disorder Daughter    Lupus Paternal Grandmother     Past Surgical History:  Procedure Laterality Date   ABDOMINAL AORTOGRAM W/LOWER EXTREMITY N/A 04/26/2021   Procedure: ABDOMINAL AORTOGRAM W/LOWER EXTREMITY;  Surgeon: Iran OuchArida, Muhammad A, MD;  Location: [facility];  Service: Cardiovascular;  Laterality: N/A;   ABDOMINAL AORTOGRAM W/LOWER EXTREMITY Right 04/02/2022   Procedure: ABDOMINAL AORTOGRAM W/LOWER EXTREMITY;  Surgeon: Leonie DouglasHawken, Thomas N, MD;  Location: [facility];  Service: Vascular;  Laterality: Right;   AMPUTATION Right 04/04/2022   Procedure: RIGHT BELOW KNEE AMPUTATION;  Surgeon: Nadara Mustarduda, Hava Massingale V, MD;  [facility];  Service: Orthopedics;  Laterality: Right;   AMPUTATION TOE Bilateral    small toe on left foot; all toes on right foot   PERIPHERAL VASCULAR BALLOON ANGIOPLASTY Right 04/26/2021   Procedure: PERIPHERAL VASCULAR BALLOON ANGIOPLASTY;  Surgeon: Iran OuchArida, Muhammad A, MD;  Location: [facility];  Service: Cardiovascular;  Laterality: Right;  superficial femoral   PERIPHERAL VASCULAR INTERVENTION Right 04/26/2021   Procedure: PERIPHERAL VASCULAR  INTERVENTION;  Surgeon: Iran OuchArida, Muhammad A, MD;  Location: [facility];  Service: Cardiovascular;  Laterality: Right;  Iliac stent   PERIPHERAL VASCULAR INTERVENTION Bilateral 04/02/2022   Procedure: PERIPHERAL VASCULAR INTERVENTION;  Surgeon: Leonie DouglasHawken, Thomas N, MD;  Location: [facility];  Service: Vascular;  Laterality: Bilateral;  common iliac   TOTAL VAGINAL HYSTERECTOMY  12/1998   Social History   Occupational  History   Not on file  Tobacco Use   Smoking status: Smoker, Current Status Unknown   Smokeless tobacco: Never  Substance and Sexual Activity   Alcohol use: Not on file   Drug use: Not on file   Sexual activity: Not on file
